# Patient Record
Sex: Male | Born: 1960 | Race: White | Hispanic: No | Marital: Married | Smoking: Former smoker
Health system: Southern US, Community
[De-identification: ages and names within clinical notes are randomized; demographics above are authoritative.]

## PROBLEM LIST (undated history)

## (undated) DIAGNOSIS — K5792 Diverticulitis of intestine, part unspecified, without perforation or abscess without bleeding: Secondary | ICD-10-CM

## (undated) DIAGNOSIS — M1990 Unspecified osteoarthritis, unspecified site: Secondary | ICD-10-CM

## (undated) DIAGNOSIS — C187 Malignant neoplasm of sigmoid colon: Secondary | ICD-10-CM

## (undated) DIAGNOSIS — I10 Essential (primary) hypertension: Secondary | ICD-10-CM

## (undated) DIAGNOSIS — K219 Gastro-esophageal reflux disease without esophagitis: Secondary | ICD-10-CM

## (undated) DIAGNOSIS — G629 Polyneuropathy, unspecified: Secondary | ICD-10-CM

## (undated) DIAGNOSIS — F419 Anxiety disorder, unspecified: Secondary | ICD-10-CM

## (undated) HISTORY — DX: Malignant neoplasm of sigmoid colon: C18.7

---

## 2015-08-22 ENCOUNTER — Emergency Department (HOSPITAL_COMMUNITY)
Admission: EM | Admit: 2015-08-22 | Discharge: 2015-08-22 | Disposition: A | Discharge status: Home / Self Care | Payer: BLUE CROSS/BLUE SHIELD | Attending: Emergency Medicine | Admitting: Emergency Medicine

## 2015-08-22 ENCOUNTER — Encounter (HOSPITAL_COMMUNITY): Payer: Self-pay | Admitting: Emergency Medicine

## 2015-08-22 ENCOUNTER — Emergency Department (HOSPITAL_COMMUNITY): Payer: BLUE CROSS/BLUE SHIELD

## 2015-08-22 DIAGNOSIS — I10 Essential (primary) hypertension: Secondary | ICD-10-CM | POA: Insufficient documentation

## 2015-08-22 DIAGNOSIS — R1032 Left lower quadrant pain: Secondary | ICD-10-CM | POA: Diagnosis present

## 2015-08-22 DIAGNOSIS — K5732 Diverticulitis of large intestine without perforation or abscess without bleeding: Secondary | ICD-10-CM | POA: Diagnosis not present

## 2015-08-22 HISTORY — DX: Essential (primary) hypertension: I10

## 2015-08-22 HISTORY — DX: Diverticulitis of intestine, part unspecified, without perforation or abscess without bleeding: K57.92

## 2015-08-22 LAB — URINALYSIS, ROUTINE W REFLEX MICROSCOPIC
GLUCOSE, UA: NEGATIVE mg/dL
HGB URINE DIPSTICK: NEGATIVE
KETONES UR: 15 mg/dL — AB
Leukocytes, UA: NEGATIVE
Nitrite: NEGATIVE
PROTEIN: NEGATIVE mg/dL
Specific Gravity, Urine: >1.03 — ABNORMAL HIGH (ref 1.005–1.030)
pH: 5.5 (ref 5.0–8.0)

## 2015-08-22 LAB — CBC
HCT: 46.7 % (ref 39.0–52.0)
Hemoglobin: 15.4 g/dL (ref 13.0–17.0)
MCH: 26.5 pg (ref 26.0–34.0)
MCHC: 33 g/dL (ref 30.0–36.0)
MCV: 80.4 fL (ref 78.0–100.0)
PLATELETS: 359 10*3/uL (ref 150–400)
RBC: 5.81 MIL/uL (ref 4.22–5.81)
RDW: 14 % (ref 11.5–15.5)
WBC: 10.6 10*3/uL — ABNORMAL HIGH (ref 4.0–10.5)

## 2015-08-22 LAB — DIFFERENTIAL
BASOS PCT: 0 %
Basophils Absolute: 0 10*3/uL (ref 0.0–0.1)
EOS ABS: 0.2 10*3/uL (ref 0.0–0.7)
EOS PCT: 1 %
Lymphocytes Relative: 18 %
Lymphs Abs: 1.9 10*3/uL (ref 0.7–4.0)
MONO ABS: 1 10*3/uL (ref 0.1–1.0)
Monocytes Relative: 10 %
Neutro Abs: 7.6 10*3/uL (ref 1.7–7.7)
Neutrophils Relative %: 71 %

## 2015-08-22 LAB — COMPREHENSIVE METABOLIC PANEL
ALT: 15 U/L — ABNORMAL LOW (ref 17–63)
ANION GAP: 8 (ref 5–15)
AST: 17 U/L (ref 15–41)
Albumin: 4.1 g/dL (ref 3.5–5.0)
Alkaline Phosphatase: 74 U/L (ref 38–126)
BUN: 18 mg/dL (ref 6–20)
CALCIUM: 9.3 mg/dL (ref 8.9–10.3)
CHLORIDE: 102 mmol/L (ref 101–111)
CO2: 26 mmol/L (ref 22–32)
CREATININE: 1.37 mg/dL — AB (ref 0.61–1.24)
GFR calc non Af Amer: 57 mL/min — ABNORMAL LOW (ref >60)
Glucose, Bld: 97 mg/dL (ref 65–99)
Potassium: 3.9 mmol/L (ref 3.5–5.1)
SODIUM: 136 mmol/L (ref 135–145)
Total Bilirubin: 0.7 mg/dL (ref 0.3–1.2)
Total Protein: 7.7 g/dL (ref 6.5–8.1)

## 2015-08-22 LAB — LIPASE, BLOOD: LIPASE: 24 U/L (ref 11–51)

## 2015-08-22 MED ORDER — SODIUM CHLORIDE 0.9 % IV SOLN
1000.0000 mL | Freq: Once | INTRAVENOUS | Status: AC
  Administered 2015-08-22: 1000 mL via INTRAVENOUS

## 2015-08-22 MED ORDER — METRONIDAZOLE 500 MG PO TABS
500.0000 mg | ORAL_TABLET | Freq: Once | ORAL | Status: AC
  Administered 2015-08-22: 500 mg via ORAL
  Filled 2015-08-22: qty 1

## 2015-08-22 MED ORDER — METRONIDAZOLE 500 MG PO TABS: 500.0000 mg | ORAL_TABLET | Freq: Three times a day (TID) | ORAL | Status: DC

## 2015-08-22 MED ORDER — SULFAMETHOXAZOLE-TRIMETHOPRIM 800-160 MG PO TABS
1.0000 | ORAL_TABLET | Freq: Two times a day (BID) | ORAL | Status: DC
  Administered 2015-08-22: 1 via ORAL
  Filled 2015-08-22: qty 1

## 2015-08-22 MED ORDER — SODIUM CHLORIDE 0.9 % IV SOLN
1000.0000 mL | INTRAVENOUS | Status: DC
  Administered 2015-08-22: 1000 mL via INTRAVENOUS

## 2015-08-22 MED ORDER — ONDANSETRON HCL 4 MG/2ML IJ SOLN
4.0000 mg | Freq: Once | INTRAMUSCULAR | Status: AC
  Administered 2015-08-22: 4 mg via INTRAVENOUS
  Filled 2015-08-22: qty 2

## 2015-08-22 MED ORDER — SULFAMETHOXAZOLE-TRIMETHOPRIM 800-160 MG PO TABS: 1.0000 | ORAL_TABLET | Freq: Two times a day (BID) | ORAL | Status: AC

## 2015-08-22 MED ORDER — IOHEXOL 300 MG/ML  SOLN
50.0000 mL | Freq: Once | INTRAMUSCULAR | Status: AC | PRN
  Administered 2015-08-22: 50 mL via ORAL

## 2015-08-22 MED ORDER — OXYCODONE-ACETAMINOPHEN 5-325 MG PO TABS: 1.0000 | ORAL_TABLET | ORAL | Status: DC | PRN

## 2015-08-22 MED ORDER — MORPHINE SULFATE (PF) 4 MG/ML IV SOLN
4.0000 mg | Freq: Once | INTRAVENOUS | Status: AC
  Administered 2015-08-22: 4 mg via INTRAVENOUS
  Filled 2015-08-22: qty 1

## 2015-08-22 MED ORDER — IOHEXOL 300 MG/ML  SOLN
100.0000 mL | Freq: Once | INTRAMUSCULAR | Status: AC | PRN
  Administered 2015-08-22: 100 mL via INTRAVENOUS

## 2015-08-22 NOTE — ED Provider Notes (Signed)
CSN: [id number]     Arrival date & time 08/22/15  1339 History   First MD Initiated Contact with Patient 08/22/15 1539     Chief Complaint  Patient presents with  . Abdominal Pain     (Consider location/radiation/quality/duration/timing/severity/associated sxs/prior Treatment) Patient is a 55 y.o. male presenting with abdominal pain. The history is provided by the patient.  Abdominal Pain He comes in with persistent left lower quadrant pain. He started having left lower quadrant pain in August, and he got worse about 2 months ago. He saw his PCP who ordered a CT scan and found he had diverticulitis. He was given a one-week course of ciprofloxacin and metronidazole which did give some temporary relief of pain, but pain recurred within a few days. He was given a second course of ciprofloxacin and metronidazole. This also gave temporary relief of pain, but symptoms recurred about 3-4 days after completion of the course of antibiotics. Pain does wax and wane and is rated at 8/10 at its worst. It is worse after eating and he does get temporary relief by moving his bowels. He initially had about a 15 pound weight loss but has not had any additional weight loss over the last month. Appetite is decreased. He has had subjective fever but has not actually had a fever when his temperature was checked. He has had chills and night sweats. He denies nausea or vomiting he has had frequent bowel movements and has had some blood in his stool. He is scheduled for colonoscopy in 2 weeks.  Past Medical History  Diagnosis Date  . Diverticulitis   . Hypertension    History reviewed. No pertinent past surgical history. No family history on file. Social History  Substance Use Topics  . Smoking status: Never Smoker   . Smokeless tobacco: None  . Alcohol Use: Yes     Comment: occ    Review of Systems  Gastrointestinal: Positive for abdominal pain.  All other systems reviewed and are negative.     Allergies   Review of patient's allergies indicates no known allergies.  Home Medications   Prior to Admission medications   Not on File   BP 110/77 mmHg  Pulse 103  Temp(Src) 98.5 F (36.9 C) (Oral)  Resp 16  Ht 6' (1.829 m)  Wt 250 lb (113.399 kg)  BMI 33.90 kg/m2  SpO2 99% Physical Exam  Nursing note and vitals reviewed.  55 year old male, resting comfortably and in no acute distress. Vital signs are significant for borderline tachycardia. Oxygen saturation is 99%, which is normal. Head is normocephalic and atraumatic. PERRLA, EOMI. Oropharynx is clear. Neck is nontender and supple without adenopathy or JVD. Back is nontender and there is no CVA tenderness. Lungs are clear without rales, wheezes, or rhonchi. Chest is nontender. Heart has regular rate and rhythm without murmur. Abdomen is soft, flat, with some moderate found left lower quadrant tenderness. There is no rebound or guarding. There are no masses or hepatosplenomegaly and peristalsis is normoactive. Extremities have no cyanosis or edema, full range of motion is present. Skin is warm and dry without rash. Neurologic: Mental status is normal, cranial nerves are intact, there are no motor or sensory deficits.  ED Course  Procedures (including critical care time) Labs Review Results for orders placed or performed during the hospital encounter of 08/22/15  Lipase, blood  Result Value Ref Range   Lipase 24 11 - 51 U/L  Comprehensive metabolic panel  Result Value Ref Range  Sodium 136 135 - 145 mmol/L   Potassium 3.9 3.5 - 5.1 mmol/L   Chloride 102 101 - 111 mmol/L   CO2 26 22 - 32 mmol/L   Glucose, Bld 97 65 - 99 mg/dL   BUN 18 6 - 20 mg/dL   Creatinine, Ser 1.37 (H) 0.61 - 1.24 mg/dL   Calcium 9.3 8.9 - 10.3 mg/dL   Total Protein 7.7 6.5 - 8.1 g/dL   Albumin 4.1 3.5 - 5.0 g/dL   AST 17 15 - 41 U/L   ALT 15 (L) 17 - 63 U/L   Alkaline Phosphatase 74 38 - 126 U/L   Total Bilirubin 0.7 0.3 - 1.2 mg/dL   GFR calc non  Af Amer 57 (L) >60 mL/min   GFR calc Af Amer >60 >60 mL/min   Anion gap 8 5 - 15  CBC  Result Value Ref Range   WBC 10.6 (H) 4.0 - 10.5 K/uL   RBC 5.81 4.22 - 5.81 MIL/uL   Hemoglobin 15.4 13.0 - 17.0 g/dL   HCT 46.7 39.0 - 52.0 %   MCV 80.4 78.0 - 100.0 fL   MCH 26.5 26.0 - 34.0 pg   MCHC 33.0 30.0 - 36.0 g/dL   RDW 14.0 11.5 - 15.5 %   Platelets 359 150 - 400 K/uL  Urinalysis, Routine w reflex microscopic (not at [facility])  Result Value Ref Range   Color, Urine YELLOW YELLOW   APPearance CLEAR CLEAR   Specific Gravity, Urine >1.030 (H) 1.005 - 1.030   pH 5.5 5.0 - 8.0   Glucose, UA NEGATIVE NEGATIVE mg/dL   Hgb urine dipstick NEGATIVE NEGATIVE   Bilirubin Urine SMALL (A) NEGATIVE   Ketones, ur 15 (A) NEGATIVE mg/dL   Protein, ur NEGATIVE NEGATIVE mg/dL   Nitrite NEGATIVE NEGATIVE   Leukocytes, UA NEGATIVE NEGATIVE  Differential  Result Value Ref Range   Neutrophils Relative % 71 %   Neutro Abs 7.6 1.7 - 7.7 K/uL   Lymphocytes Relative 18 %   Lymphs Abs 1.9 0.7 - 4.0 K/uL   Monocytes Relative 10 %   Monocytes Absolute 1.0 0.1 - 1.0 K/uL   Eosinophils Relative 1 %   Eosinophils Absolute 0.2 0.0 - 0.7 K/uL   Basophils Relative 0 %   Basophils Absolute 0.0 0.0 - 0.1 K/uL   Imaging Review Ct Abdomen Pelvis W Contrast  08/22/2015  CLINICAL DATA:  Left lower quadrant pain.  History diverticulitis EXAM: CT ABDOMEN AND PELVIS WITH CONTRAST TECHNIQUE: Multidetector CT imaging of the abdomen and pelvis was performed using the standard protocol following bolus administration of intravenous contrast. CONTRAST:  59mL OMNIPAQUE IOHEXOL 300 MG/ML SOLN, 121mL OMNIPAQUE IOHEXOL 300 MG/ML SOLN COMPARISON:  None. FINDINGS: Lower chest:  Negative Hepatobiliary: Fatty infiltration of the liver. No focal liver lesion. Liver size and contour normal. Gallbladder and bile ducts normal. Pancreas: Atrophic pancreatic changes without edema or mass. No pancreatic calcifications. Spleen: Negative  Adrenals/Urinary Tract: Symmetric renal contrast excretion bilaterally. No mass or obstruction. No renal stone. New thickening of the dome of bladder likely due to adjacent sigmoid colon diverticulitis. Stomach/Bowel: Segmental thickening of the sigmoid colon with associated pericolonic edema. Scattered diverticula are present in the descending colon and sigmoid colon. Findings consistent with diverticulitis. No abscess. No free air. Vascular/Lymphatic: Normal aorta and iliac arteries. No aneurysm. No lymphadenopathy Reproductive: Mild prostate enlargement. Other: No free fluid. 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 No  fracture. No acute bony process. IMPRESSION: Sigmoid diverticulitis. No abscess or free air. There is thickening of the dome of the bladder consistent with adjacent inflammation from diverticulitis. Mild fatty infiltration liver.  Atrophic pancreatic changes. Electronically Signed   By: Franchot Gallo M.D.   On: 08/22/2015 17:44   I have personally reviewed and evaluated these images and lab results as part of my medical decision-making.  MDM   Final diagnoses:  Sigmoid diverticulitis    Left lower quadrant pain with known history of diverticulitis. He did bring in a CT report from [place], [place] which showed uncomplicated diverticulitis. Unfortunately, I cannot view those images. With incomplete response to 2 courses of antibiotics, he may need a prolonged course of antibiotics. He needs to be screened for possible abscess which might account for failure to respond. He will be sent for CT scan of abdomen and pelvis. He does show subtle signs of dehydration with underlying tachycardia and elevated urine specific gravity, so he will be given IV hydration. He has no old records and the [facility] system.  CT shows evidence of diverticulitis without evidence of perforation or abscess. I am  concerned that the failure to respond to antibiotics may indicate another pathology mimicking diverticulitis. However, he is already scheduled for colonoscopy in 10 days. He is nontoxic in appearance and has normal WBC. I have offered him the option of inpatient care but he states he would prefer to go home. I will try him with a different antibiotic and slightly longer course. He is given a prescription for trimethoprim-sulfamethoxazole plus metronidazole for 10 days. This will take him up to the point of his colonoscopy at which point his gastro-neurologist can make recommendations for further treatment. He is also given a prescription for oxycodone-acetaminophen for pain. Return precautions given.  Delora Fuel, MD [id number]

## 2015-08-22 NOTE — Discharge Instructions (Signed)
Return if pain is getting worse or if you start running a high fever.`  Diverticulitis Diverticulitis is inflammation or infection of small pouches in your colon that form when you have a condition called diverticulosis. The pouches in your colon are called diverticula. Your colon, or large intestine, is where water is absorbed and stool is formed. Complications of diverticulitis can include:  Bleeding.  Severe infection.  Severe pain.  Perforation of your colon.  Obstruction of your colon. CAUSES  Diverticulitis is caused by bacteria. Diverticulitis happens when stool becomes trapped in diverticula. This allows bacteria to grow in the diverticula, which can lead to inflammation and infection. RISK FACTORS People with diverticulosis are at risk for diverticulitis. Eating a diet that does not include enough fiber from fruits and vegetables may make diverticulitis more likely to develop. SYMPTOMS  Symptoms of diverticulitis may include:  Abdominal pain and tenderness. The pain is normally located on the left side of the abdomen, but may occur in other areas.  Fever and chills.  Bloating.  Cramping.  Nausea.  Vomiting.  Constipation.  Diarrhea.  Blood in your stool. DIAGNOSIS  Your health care provider will ask you about your medical history and do a physical exam. You may need to have tests done because many medical conditions can cause the same symptoms as diverticulitis. Tests may include:  Blood tests.  Urine tests.  Imaging tests of the abdomen, including X-rays and CT scans. When your condition is under control, your health care provider may recommend that you have a colonoscopy. A colonoscopy can show how severe your diverticula are and whether something else is causing your symptoms. TREATMENT  Most cases of diverticulitis are mild and can be treated at home. Treatment may include:  Taking over-the-counter pain medicines.  Following a clear liquid  diet.  Taking antibiotic medicines by mouth for 7-10 days. More severe cases may be treated at a hospital. Treatment may include:  Not eating or drinking.  Taking prescription pain medicine.  Receiving antibiotic medicines through an IV tube.  Receiving fluids and nutrition through an IV tube.  Surgery. HOME CARE INSTRUCTIONS   Follow your health care provider's instructions carefully.  Follow a full liquid diet or other diet as directed by your health care provider. After your symptoms improve, your health care provider may tell you to change your diet. He or she may recommend you eat a high-fiber diet. Fruits and vegetables are good sources of fiber. Fiber makes it easier to pass stool.  Take fiber supplements or probiotics as directed by your health care provider.  Only take medicines as directed by your health care provider.  Keep all your follow-up appointments. SEEK MEDICAL CARE IF:   Your pain does not improve.  You have a hard time eating food.  Your bowel movements do not return to normal. SEEK IMMEDIATE MEDICAL CARE IF:   Your pain becomes worse.  Your symptoms do not get better.  Your symptoms suddenly get worse.  You have a fever.  You have repeated vomiting.  You have bloody or black, tarry stools. MAKE SURE YOU:   Understand these instructions.  Will watch your condition.  Will get help right away if you are not doing well or get worse.   This information is not intended to replace advice given to you by your health care provider. Make sure you discuss any questions you have with your health care provider.   Document Released: 03/14/2005 Document Revised: 06/09/2013 Document Reviewed: 04/29/2013 Elsevier  Interactive Patient Education 2016 [employer].  Sulfamethoxazole; Trimethoprim, SMX-TMP tablets What is this medicine? SULFAMETHOXAZOLE; TRIMETHOPRIM or SMX-TMP (suhl fuh meth OK suh zohl; trye METH oh prim) is a combination of a  sulfonamide antibiotic and a second antibiotic, trimethoprim. It is used to treat or prevent certain kinds of bacterial infections. It will not work for colds, flu, or other viral infections. This medicine may be used for other purposes; ask your health care provider or pharmacist if you have questions. What should I tell my health care provider before I take this medicine? They need to know if you have any of these conditions: -anemia -asthma -being treated with anticonvulsants -if you frequently drink alcohol containing drinks -kidney disease -liver disease -low level of folic acid or [id number] dehydrogenase -poor nutrition or malabsorption -porphyria -severe allergies -thyroid disorder -an unusual or allergic reaction to sulfamethoxazole, trimethoprim, sulfa drugs, other medicines, foods, dyes, or preservatives -pregnant or trying to get pregnant -breast-feeding How should I use this medicine? Take this medicine by mouth with a full glass of water. Follow the directions on the prescription label. Take your medicine at regular intervals. Do not take it more often than directed. Do not skip doses or stop your medicine early. Talk to your pediatrician regarding the use of this medicine in children. Special care may be needed. This medicine has been used in children as young as 72 months of age. Overdosage: If you think you have taken too much of this medicine contact a poison control center or emergency room at once. NOTE: This medicine is only for you. Do not share this medicine with others. What if I miss a dose? If you miss a dose, take it as soon as you can. If it is almost time for your next dose, take only that dose. Do not take double or extra doses. What may interact with this medicine? Do not take this medicine with any of the following medications: -aminobenzoate potassium -dofetilide -metronidazole This medicine may also interact with the following medications: -ACE  inhibitors like benazepril, enalapril, lisinopril, and ramipril -birth control pills -cyclosporine -digoxin -diuretics -indomethacin -medicines for diabetes -methenamine -methotrexate -phenytoin -potassium supplements -pyrimethamine -sulfinpyrazone -tricyclic antidepressants -warfarin This list may not describe all possible interactions. Give your health care provider a list of all the medicines, herbs, non-prescription drugs, or dietary supplements you use. Also tell them if you smoke, drink alcohol, or use illegal drugs. Some items may interact with your medicine. What should I watch for while using this medicine? Tell your doctor or health care professional if your symptoms do not improve. Drink several glasses of water a day to reduce the risk of kidney problems. Do not treat diarrhea with over the counter products. Contact your doctor if you have diarrhea that lasts more than 2 days or if it is severe and watery. This medicine can make you more sensitive to the sun. Keep out of the sun. If you cannot avoid being in the sun, wear protective clothing and use a sunscreen. Do not use sun lamps or tanning beds/booths. What side effects may I notice from receiving this medicine? Side effects that you should report to your doctor or health care professional as soon as possible: -allergic reactions like skin rash or hives, swelling of the face, lips, or tongue -breathing problems -fever or chills, sore throat -irregular heartbeat, chest pain -joint or muscle pain -pain or difficulty passing urine -red pinpoint spots on skin -redness, blistering, peeling or loosening of the skin, including  inside the mouth -unusual bleeding or bruising -unusually weak or tired -yellowing of the eyes or skin Side effects that usually do not require medical attention (report to your doctor or health care professional if they continue or are bothersome): -diarrhea -dizziness -headache -loss of  appetite -nausea, vomiting -nervousness This list may not describe all possible side effects. Call your doctor for medical advice about side effects. You may report side effects to FDA at 1-800-FDA-1088. Where should I keep my medicine? Keep out of the reach of children. Store at room temperature between 20 to 25 degrees C (68 to 77 degrees F). Protect from light. Throw away any unused medicine after the expiration date. NOTE: This sheet is a summary. It may not cover all possible information. If you have questions about this medicine, talk to your doctor, pharmacist, or health care provider.    2016, Elsevier/[employer] Standard. (2013-01-09 14:38:26)  Metronidazole tablets or capsules What is this medicine? METRONIDAZOLE (me troe NI da zole) is an antiinfective. It is used to treat certain kinds of bacterial and protozoal infections. It will not work for colds, flu, or other viral infections. This medicine may be used for other purposes; ask your health care provider or pharmacist if you have questions. What should I tell my health care provider before I take this medicine? They need to know if you have any of these conditions: -anemia or other blood disorders -disease of the nervous system -fungal or yeast infection -if you drink alcohol containing drinks -liver disease -seizures -an unusual or allergic reaction to metronidazole, or other medicines, foods, dyes, or preservatives -pregnant or trying to get pregnant -breast-feeding How should I use this medicine? Take this medicine by mouth with a full glass of water. Follow the directions on the prescription label. Take your medicine at regular intervals. Do not take your medicine more often than directed. Take all of your medicine as directed even if you think you are better. Do not skip doses or stop your medicine early. Talk to your pediatrician regarding the use of this medicine in children. Special care may be needed. Overdosage: If  you think you have taken too much of this medicine contact a poison control center or emergency room at once. NOTE: This medicine is only for you. Do not share this medicine with others. What if I miss a dose? If you miss a dose, take it as soon as you can. If it is almost time for your next dose, take only that dose. Do not take double or extra doses. What may interact with this medicine? Do not take this medicine with any of the following medications: -alcohol or any product that contains alcohol -amprenavir oral solution -cisapride -disulfiram -dofetilide -dronedarone -paclitaxel injection -pimozide -ritonavir oral solution -sertraline oral solution -sulfamethoxazole-trimethoprim injection -thioridazine -ziprasidone This medicine may also interact with the following medications: -birth control pills -cimetidine -lithium -other medicines that prolong the QT interval (cause an abnormal heart rhythm) -phenobarbital -phenytoin -warfarin This list may not describe all possible interactions. Give your health care provider a list of all the medicines, herbs, non-prescription drugs, or dietary supplements you use. Also tell them if you smoke, drink alcohol, or use illegal drugs. Some items may interact with your medicine. What should I watch for while using this medicine? Tell your doctor or health care professional if your symptoms do not improve or if they get worse. You may get drowsy or dizzy. Do not drive, use machinery, or do anything that needs mental alertness  until you know how this medicine affects you. Do not stand or sit up quickly, especially if you are an older patient. This reduces the risk of dizzy or fainting spells. Avoid alcoholic drinks while you are taking this medicine and for three days afterward. Alcohol may make you feel dizzy, sick, or flushed. If you are being treated for a sexually transmitted disease, avoid sexual contact until you have finished your treatment.  Your sexual partner may also need treatment. What side effects may I notice from receiving this medicine? Side effects that you should report to your doctor or health care professional as soon as possible: -allergic reactions like skin rash or hives, swelling of the face, lips, or tongue -confusion, clumsiness -difficulty speaking -discolored or sore mouth -dizziness -fever, infection -numbness, tingling, pain or weakness in the hands or feet -trouble passing urine or change in the amount of urine -redness, blistering, peeling or loosening of the skin, including inside the mouth -seizures -unusually weak or tired -vaginal irritation, dryness, or discharge Side effects that usually do not require medical attention (report to your doctor or health care professional if they continue or are bothersome): -diarrhea -headache -irritability -metallic taste -nausea -stomach pain or cramps -trouble sleeping This list may not describe all possible side effects. Call your doctor for medical advice about side effects. You may report side effects to FDA at 1-800-FDA-1088. Where should I keep my medicine? Keep out of the reach of children. Store at room temperature below 25 degrees C (77 degrees F). Protect from light. Keep container tightly closed. Throw away any unused medicine after the expiration date. NOTE: This sheet is a summary. It may not cover all possible information. If you have questions about this medicine, talk to your doctor, pharmacist, or health care provider.    2016, Elsevier/Gold Standard. (2013-01-09 14:08:39)  Acetaminophen; Oxycodone tablets What is this medicine? ACETAMINOPHEN; OXYCODONE (a set a MEE noe fen; ox i KOE done) is a pain reliever. It is used to treat moderate to severe pain. This medicine may be used for other purposes; ask your health care provider or pharmacist if you have questions. What should I tell my health care provider before I take this  medicine? They need to know if you have any of these conditions: -brain tumor -Crohn's disease, inflammatory bowel disease, or ulcerative colitis -drug abuse or addiction -head injury -heart or circulation problems -if you often drink alcohol -kidney disease or problems going to the bathroom -liver disease -lung disease, asthma, or breathing problems -an unusual or allergic reaction to acetaminophen, oxycodone, other opioid analgesics, other medicines, foods, dyes, or preservatives -pregnant or trying to get pregnant -breast-feeding How should I use this medicine? Take this medicine by mouth with a full glass of water. Follow the directions on the prescription label. You can take it with or without food. If it upsets your stomach, take it with food. Take your medicine at regular intervals. Do not take it more often than directed. Talk to your pediatrician regarding the use of this medicine in children. Special care may be needed. Patients over 41 years old may have a stronger reaction and need a smaller dose. Overdosage: If you think you have taken too much of this medicine contact a poison control center or emergency room at once. NOTE: This medicine is only for you. Do not share this medicine with others. What if I miss a dose? If you miss a dose, take it as soon as you can. If it is  almost time for your next dose, take only that dose. Do not take double or extra doses. What may interact with this medicine? -alcohol -antihistamines -barbiturates like amobarbital, butalbital, butabarbital, methohexital, pentobarbital, phenobarbital, thiopental, and secobarbital -benztropine -drugs for bladder problems like solifenacin, trospium, oxybutynin, tolterodine, hyoscyamine, and methscopolamine -drugs for breathing problems like ipratropium and tiotropium -drugs for certain stomach or intestine problems like propantheline, homatropine methylbromide, glycopyrrolate, atropine, belladonna, and  dicyclomine -general anesthetics like etomidate, ketamine, nitrous oxide, propofol, desflurane, enflurane, halothane, isoflurane, and sevoflurane -medicines for depression, anxiety, or psychotic disturbances -medicines for sleep -muscle relaxants -naltrexone -narcotic medicines (opiates) for pain -phenothiazines like perphenazine, thioridazine, chlorpromazine, mesoridazine, fluphenazine, prochlorperazine, promazine, and trifluoperazine -scopolamine -tramadol -trihexyphenidyl This list may not describe all possible interactions. Give your health care provider a list of all the medicines, herbs, non-prescription drugs, or dietary supplements you use. Also tell them if you smoke, drink alcohol, or use illegal drugs. Some items may interact with your medicine. What should I watch for while using this medicine? Tell your doctor or health care professional if your pain does not go away, if it gets worse, or if you have new or a different type of pain. You may develop tolerance to the medicine. Tolerance means that you will need a higher dose of the medication for pain relief. Tolerance is normal and is expected if you take this medicine for a long time. Do not suddenly stop taking your medicine because you may develop a severe reaction. Your body becomes used to the medicine. This does NOT mean you are addicted. Addiction is a behavior related to getting and using a drug for a non-medical reason. If you have pain, you have a medical reason to take pain medicine. Your doctor will tell you how much medicine to take. If your doctor wants you to stop the medicine, the dose will be slowly lowered over time to avoid any side effects. You may get drowsy or dizzy. Do not drive, use machinery, or do anything that needs mental alertness until you know how this medicine affects you. Do not stand or sit up quickly, especially if you are an older patient. This reduces the risk of dizzy or fainting spells. Alcohol may  interfere with the effect of this medicine. Avoid alcoholic drinks. There are different types of narcotic medicines (opiates) for pain. If you take more than one type at the same time, you may have more side effects. Give your health care provider a list of all medicines you use. Your doctor will tell you how much medicine to take. Do not take more medicine than directed. Call emergency for help if you have problems breathing. The medicine will cause constipation. Try to have a bowel movement at least every 2 to 3 days. If you do not have a bowel movement for 3 days, call your doctor or health care professional. Do not take Tylenol (acetaminophen) or medicines that have acetaminophen with this medicine. Too much acetaminophen can be very dangerous. Many nonprescription medicines contain acetaminophen. Always read the labels carefully to avoid taking more acetaminophen. What side effects may I notice from receiving this medicine? Side effects that you should report to your doctor or health care professional as soon as possible: -allergic reactions like skin rash, itching or hives, swelling of the face, lips, or tongue -breathing difficulties, wheezing -confusion -light headedness or fainting spells -severe stomach pain -unusually weak or tired -yellowing of the skin or the whites of the eyes Side effects that usually do  not require medical attention (report to your doctor or health care professional if they continue or are bothersome): -dizziness -drowsiness -nausea -vomiting This list may not describe all possible side effects. Call your doctor for medical advice about side effects. You may report side effects to FDA at 1-800-FDA-1088. Where should I keep my medicine? Keep out of the reach of children. This medicine can be abused. Keep your medicine in a safe place to protect it from theft. Do not share this medicine with anyone. Selling or giving away this medicine is dangerous and against the  law. This medicine may cause accidental overdose and death if it taken by other adults, children, or pets. Mix any unused medicine with a substance like cat litter or coffee grounds. Then throw the medicine away in a sealed container like a sealed bag or a coffee can with a lid. Do not use the medicine after the expiration date. Store at room temperature between 20 and 25 degrees C (68 and 77 degrees F). NOTE: This sheet is a summary. It may not cover all possible information. If you have questions about this medicine, talk to your doctor, pharmacist, or health care provider.    2016, [employer]. (2014-05-05 15:18:46)

## 2015-08-22 NOTE — ED Notes (Signed)
Pt reports on-going LLQ abdominal pain. Pt had a CT scan in January and dx with diverticulitis. Pt reports finishing medications, but reports no relief. Pt also c/o loss of appetite, weight loss, and diarrhea.

## 2015-08-22 NOTE — ED Notes (Signed)
MD at bedside. 

## 2015-09-09 ENCOUNTER — Emergency Department (HOSPITAL_COMMUNITY): Payer: BLUE CROSS/BLUE SHIELD

## 2015-09-09 ENCOUNTER — Encounter (HOSPITAL_COMMUNITY): Payer: Self-pay

## 2015-09-09 ENCOUNTER — Inpatient Hospital Stay (HOSPITAL_COMMUNITY)
Admission: EM | Admit: 2015-09-09 | Discharge: 2015-09-12 | DRG: 391 | Disposition: A | Discharge status: Home / Self Care | Payer: BLUE CROSS/BLUE SHIELD | Attending: Internal Medicine | Admitting: Internal Medicine

## 2015-09-09 DIAGNOSIS — R1032 Left lower quadrant pain: Secondary | ICD-10-CM | POA: Diagnosis present

## 2015-09-09 DIAGNOSIS — K5792 Diverticulitis of intestine, part unspecified, without perforation or abscess without bleeding: Secondary | ICD-10-CM

## 2015-09-09 DIAGNOSIS — E43 Unspecified severe protein-calorie malnutrition: Secondary | ICD-10-CM | POA: Diagnosis present

## 2015-09-09 DIAGNOSIS — K5720 Diverticulitis of large intestine with perforation and abscess without bleeding: Secondary | ICD-10-CM | POA: Diagnosis present

## 2015-09-09 DIAGNOSIS — Z833 Family history of diabetes mellitus: Secondary | ICD-10-CM | POA: Diagnosis not present

## 2015-09-09 DIAGNOSIS — K625 Hemorrhage of anus and rectum: Secondary | ICD-10-CM | POA: Diagnosis present

## 2015-09-09 DIAGNOSIS — I10 Essential (primary) hypertension: Secondary | ICD-10-CM | POA: Diagnosis present

## 2015-09-09 DIAGNOSIS — Z6831 Body mass index (BMI) 31.0-31.9, adult: Secondary | ICD-10-CM

## 2015-09-09 DIAGNOSIS — K921 Melena: Secondary | ICD-10-CM | POA: Diagnosis not present

## 2015-09-09 DIAGNOSIS — K5732 Diverticulitis of large intestine without perforation or abscess without bleeding: Secondary | ICD-10-CM | POA: Diagnosis not present

## 2015-09-09 LAB — CBC
HCT: 43.4 % (ref 39.0–52.0)
Hemoglobin: 14.5 g/dL (ref 13.0–17.0)
MCH: 26.5 pg (ref 26.0–34.0)
MCHC: 33.4 g/dL (ref 30.0–36.0)
MCV: 79.3 fL (ref 78.0–100.0)
PLATELETS: 367 10*3/uL (ref 150–400)
RBC: 5.47 MIL/uL (ref 4.22–5.81)
RDW: 13.4 % (ref 11.5–15.5)
WBC: 6.2 10*3/uL (ref 4.0–10.5)

## 2015-09-09 LAB — COMPREHENSIVE METABOLIC PANEL
ALK PHOS: 67 U/L (ref 38–126)
ALT: 12 U/L — AB (ref 17–63)
AST: 15 U/L (ref 15–41)
Albumin: 3.5 g/dL (ref 3.5–5.0)
Anion gap: 11 (ref 5–15)
BUN: 11 mg/dL (ref 6–20)
CALCIUM: 9 mg/dL (ref 8.9–10.3)
CHLORIDE: 104 mmol/L (ref 101–111)
CO2: 25 mmol/L (ref 22–32)
CREATININE: 0.96 mg/dL (ref 0.61–1.24)
GFR calc Af Amer: >60 mL/min (ref >60)
GFR calc non Af Amer: >60 mL/min (ref >60)
GLUCOSE: 124 mg/dL — AB (ref 65–99)
Potassium: 3.8 mmol/L (ref 3.5–5.1)
SODIUM: 140 mmol/L (ref 135–145)
Total Bilirubin: 0.5 mg/dL (ref 0.3–1.2)
Total Protein: 7.2 g/dL (ref 6.5–8.1)

## 2015-09-09 LAB — URINALYSIS, ROUTINE W REFLEX MICROSCOPIC
GLUCOSE, UA: NEGATIVE mg/dL
HGB URINE DIPSTICK: NEGATIVE
KETONES UR: 15 mg/dL — AB
Leukocytes, UA: NEGATIVE
Nitrite: NEGATIVE
PROTEIN: NEGATIVE mg/dL
Specific Gravity, Urine: 1.02 (ref 1.005–1.030)
pH: 5 (ref 5.0–8.0)

## 2015-09-09 LAB — LIPASE, BLOOD: LIPASE: 23 U/L (ref 11–51)

## 2015-09-09 MED ORDER — ENSURE ENLIVE PO LIQD
237.0000 mL | Freq: Two times a day (BID) | ORAL | Status: DC
  Administered 2015-09-10 – 2015-09-11 (×2): 237 mL via ORAL

## 2015-09-09 MED ORDER — SODIUM CHLORIDE 0.9 % IV BOLUS (SEPSIS)
1000.0000 mL | Freq: Once | INTRAVENOUS | Status: AC
  Administered 2015-09-09: 1000 mL via INTRAVENOUS

## 2015-09-09 MED ORDER — PIPERACILLIN-TAZOBACTAM 3.375 G IVPB 30 MIN
3.3750 g | Freq: Once | INTRAVENOUS | Status: AC
  Administered 2015-09-09: 3.375 g via INTRAVENOUS
  Filled 2015-09-09: qty 50

## 2015-09-09 MED ORDER — SODIUM CHLORIDE 0.9 % IV SOLN: 250.0000 mL | INTRAVENOUS | Status: DC | PRN

## 2015-09-09 MED ORDER — HYDROMORPHONE HCL 1 MG/ML IJ SOLN
1.0000 mg | Freq: Once | INTRAMUSCULAR | Status: AC
  Administered 2015-09-09: 1 mg via INTRAVENOUS
  Filled 2015-09-09: qty 1

## 2015-09-09 MED ORDER — MORPHINE SULFATE (PF) 2 MG/ML IV SOLN
2.0000 mg | INTRAVENOUS | Status: DC | PRN
  Administered 2015-09-10: 2 mg via INTRAVENOUS
  Filled 2015-09-09: qty 1

## 2015-09-09 MED ORDER — ONDANSETRON HCL 4 MG/2ML IJ SOLN
4.0000 mg | Freq: Four times a day (QID) | INTRAMUSCULAR | Status: DC | PRN
  Administered 2015-09-09: 4 mg via INTRAVENOUS
  Filled 2015-09-09 (×2): qty 2

## 2015-09-09 MED ORDER — IOHEXOL 300 MG/ML  SOLN
100.0000 mL | Freq: Once | INTRAMUSCULAR | Status: AC | PRN
  Administered 2015-09-09: 100 mL via INTRAVENOUS

## 2015-09-09 MED ORDER — ACETAMINOPHEN 325 MG PO TABS: 650.0000 mg | ORAL_TABLET | Freq: Four times a day (QID) | ORAL | Status: DC | PRN

## 2015-09-09 MED ORDER — SODIUM CHLORIDE 0.9% FLUSH: 3.0000 mL | INTRAVENOUS | Status: DC | PRN

## 2015-09-09 MED ORDER — ONDANSETRON HCL 4 MG PO TABS
4.0000 mg | ORAL_TABLET | Freq: Four times a day (QID) | ORAL | Status: DC | PRN
  Administered 2015-09-11 – 2015-09-12 (×4): 4 mg via ORAL
  Filled 2015-09-09 (×4): qty 1

## 2015-09-09 MED ORDER — PIPERACILLIN-TAZOBACTAM 3.375 G IVPB
3.3750 g | Freq: Three times a day (TID) | INTRAVENOUS | Status: DC
  Administered 2015-09-09 – 2015-09-12 (×9): 3.375 g via INTRAVENOUS
  Filled 2015-09-09 (×6): qty 50

## 2015-09-09 MED ORDER — LISINOPRIL 10 MG PO TABS
10.0000 mg | ORAL_TABLET | Freq: Every day | ORAL | Status: DC
  Administered 2015-09-09 – 2015-09-12 (×4): 10 mg via ORAL
  Filled 2015-09-09 (×4): qty 1

## 2015-09-09 MED ORDER — ADULT MULTIVITAMIN W/MINERALS CH
1.0000 | ORAL_TABLET | Freq: Every day | ORAL | Status: DC
  Administered 2015-09-09 – 2015-09-12 (×4): 1 via ORAL
  Filled 2015-09-09 (×4): qty 1

## 2015-09-09 MED ORDER — IOHEXOL 300 MG/ML  SOLN
50.0000 mL | Freq: Once | INTRAMUSCULAR | Status: AC | PRN
  Administered 2015-09-09: 50 mL via ORAL

## 2015-09-09 MED ORDER — SODIUM CHLORIDE 0.9% FLUSH
3.0000 mL | Freq: Two times a day (BID) | INTRAVENOUS | Status: DC
  Administered 2015-09-09 – 2015-09-11 (×3): 3 mL via INTRAVENOUS

## 2015-09-09 MED ORDER — ACETAMINOPHEN 650 MG RE SUPP: 650.0000 mg | Freq: Four times a day (QID) | RECTAL | Status: DC | PRN

## 2015-09-09 MED ORDER — HYDROCODONE-ACETAMINOPHEN 5-325 MG PO TABS
1.0000 | ORAL_TABLET | ORAL | Status: DC | PRN
  Administered 2015-09-09 – 2015-09-12 (×5): 2 via ORAL
  Filled 2015-09-09 (×5): qty 2

## 2015-09-09 NOTE — ED Notes (Signed)
Report given to floor. Pt ready for transfer. 

## 2015-09-09 NOTE — ED Notes (Signed)
Pt complain of abdominal pain. States he had a colonoscopy in [place] and went back for his result but they were not ready. Pt states he still has blood in his stool

## 2015-09-09 NOTE — H&P (Signed)
History and Physical  Derek Rodriguez [id number] DOB: 12-20-1960 DOA: 09/09/2015  Referring physician: Dr. Jola Schmidt in [place] PCP: [name], MD   Chief Complaint: abdominal pain  HPI:  71yom PMH HTN presented to ED with ongoing left lower quadrant abdominal pain. CT scan demonstrated diverticulitis with contained perforation. Admitted for IV antibiotics and general surgical consultation.  Patient became sick at the end of the year around Christmas time 2016. January 2017 he had a CAT scan done which showed diverticulitis and he was treated with ciprofloxacin and Flagyl for 2 weeks. He had brief improvement but again developed left lower quadrant pain and had several more rounds of antibiotics including the above as well as Bactrim. He was seen in the emergency department early March at [facility] and discharged on antibiotics. He saw gastroenterologist in [place] and had a colonoscopy performed March 16 which was limited secondary to inflammation. He has not heard the results of this yet.  For the last month he's had blood with bowel movements, frequent bowel movements up to 12 times during a 12 hour work shift. Bowel movements are liquid. Last normal bowel movement more than one month ago. No history of inflammatory bowel disease or other bowel problems. No family history of bowel problems. No specific alleviating factors. Symptoms seem to worsen with eating although now he has pain constantly.  In the emergency department afebrile, VSS Pertinent labs: CMP and CBC unremarkable, lipase normal. Imaging: per radiolology: Continued severe inflammation of the proximal half of the sigmoid colon. There is a 2.6 cm oval low-attenuation structure extending between the posterior wall of the inflamed sigmoid colon and the dome of the bladder. This could represent a contained perforation or small abscess.  Review of Systems:  Negative for fever, visual changes, sore throat, rash, new muscle  aches, chest pain, SOB, dysuria, n/v.  Positive for chills.   Past Medical History  Diagnosis Date  . Diverticulitis   . Hypertension     History reviewed. No pertinent past surgical history.  None  Social History:  reports that he has never smoked. He does not have any smokeless tobacco history on file. He reports that he drinks alcohol. He reports that he does not use illicit drugs.   No Known Allergies  Family History  Problem Relation Age of Onset  . Diabetes Father      Prior to Admission medications   Medication Sig Start Date End Date Taking? Authorizing Provider  amoxicillin-clavulanate (AUGMENTIN) 500-125 MG tablet Take 1 tablet by mouth 3 (three) times daily.   Yes Historical Provider, MD  ibuprofen (ADVIL,MOTRIN) 200 MG tablet Take 400 mg by mouth every 6 (six) hours as needed.   Yes Historical Provider, MD  lisinopril (PRINIVIL,ZESTRIL) 10 MG tablet Take 10 mg by mouth daily.   Yes Historical Provider, MD  meloxicam (MOBIC) 15 MG tablet Take 15 mg by mouth every evening.    Yes Historical Provider, MD  ondansetron (ZOFRAN) 4 MG tablet Take 4 mg by mouth every 6 (six) hours as needed. 09/01/15  Yes Historical Provider, MD  Probiotic Product ([place]) CAPS Take 1 capsule by mouth daily.   Yes Historical Provider, MD  metroNIDAZOLE (FLAGYL) 500 MG tablet Take 1 tablet (500 mg total) by mouth 3 (three) times daily. Patient not taking: Reported on 09/09/2015 [id number]   Delora Fuel, MD  oxyCODONE-acetaminophen (PERCOCET) 5-325 MG tablet Take 1 tablet by mouth every 4 (four) hours as needed for moderate pain. Patient not taking: Reported  on 09/09/2015 [id number]   Delora Fuel, MD   Physical Exam: Filed Vitals:   09/09/15 1200 09/09/15 1230 09/09/15 1310 09/09/15 1349  BP: 142/97 122/96 126/96 130/87  Pulse: 71 58 55 55  Temp:   97.6 F (36.4 C) 97.5 F (36.4 C)  TempSrc:   Oral Oral  Resp:   16 16  Height:    6' (1.829 m)  Weight:      SpO2: 95% 97% 98% 99%     Constitutional:  . Appears calm and comfortable Eyes:  . PERRL and irises appear normal . Normal conjunctivae and lids ENMT:  . external ears, nose appear normal . grossly normal hearing . Lips appear normal; Fair dentition . Oropharynx: mucosa, tongue,posterior pharynx appear normal Neck:  . neck appears normal, no masses, normal ROM . no thyromegaly Respiratory:  . CTA bilaterally, no w/r/r.  . Respiratory effort normal. No retractions or accessory muscle use Cardiovascular:  . RRR, no m/r/g . No LE extremity edema   Abdomen:  . Abdomen appears normal; no masses. No rebound or guarding except pain in the left lower quadrant with palpation. . No hernias Musculoskeletal:  . Digits/nails all extremities: no clubbing, cyanosis, petechiae, infection . RUE, LUE, RLE, LLE   o strength and tone normal, no atrophy, no abnormal movements o No tenderness, masses Skin:  . No rashes, lesions, ulcers . palpation of skin: no induration or nodules Neurologic:  . Appears grossly normal Psychiatric:  . judgement and insight appear normal . Mental status o Mood, affect appropriate  Wt Readings from Last 3 Encounters:  09/09/15 106.595 kg (235 lb)  08/22/15 113.399 kg (250 lb)    Labs on Admission:  Basic Metabolic Panel:  Recent Labs Lab 09/09/15 0928  NA 140  K 3.8  CL 104  CO2 25  GLUCOSE 124*  BUN 11  CREATININE 0.96  CALCIUM 9.0    Liver Function Tests:  Recent Labs Lab 09/09/15 0928  AST 15  ALT 12*  ALKPHOS 67  BILITOT 0.5  PROT 7.2  ALBUMIN 3.5    Recent Labs Lab 09/09/15 0928  LIPASE 23   CBC:  Recent Labs Lab 09/09/15 0928  WBC 6.2  HGB 14.5  HCT 43.4  MCV 79.3  PLT 367    Radiological Exams on Admission: Ct Abdomen Pelvis W Contrast  09/09/2015  CLINICAL DATA:  Abdominal pain, had colonoscopy at an outside facility recently, left lower quadrant pain EXAM: CT ABDOMEN AND PELVIS WITH CONTRAST TECHNIQUE: Multidetector CT imaging of  the abdomen and pelvis was performed using the standard protocol following bolus administration of intravenous contrast. CONTRAST:  137mL OMNIPAQUE IOHEXOL 300 MG/ML SOLN, 67mL OMNIPAQUE IOHEXOL 300 MG/ML SOLN COMPARISON:  08/22/2015 FINDINGS: Lower chest:  The visualized portions of the lung bases are clear. Hepatobiliary: Mild diffuse steatosis of the liver. Gallbladder is normal. Pancreas: Mild fatty atrophy of the pancreas. Spleen: Spleen is normal Adrenals/Urinary Tract: Adrenal glands are normal. Kidneys are normal. No hydronephrosis. Bladder shows wall thickening particularly at the dome similar to prior study. Stomach/Bowel: There is severe wall thickening involving the sigmoid colon proximal half, the same area that was previously involved. There is surrounding inflammatory change. There is a nonobstructive bowel gas pattern. There is stool throughout the colon. No other bowel abnormalities. Vascular/Lymphatic: No significant vascular abnormality. There are prominent lymph nodes in the peritoneal fat adjacent to the sigmoid colon measuring up to 9 mm with surrounding inflammatory change. At the root of the mesentery there are  also mildly prominent lymph nodes measuring up to 10 mm. Reproductive: No significant abnormalities Other: There is a 2.6 cm mildly low-attenuation structure extending between the inferior wall the inflamed sigmoid colon can the dome of the bladder. This was present previously. Musculoskeletal: No acute findings IMPRESSION: Continued severe inflammation of the proximal half of the sigmoid colon. There is a 2.6 cm oval low-attenuation structure extending between the posterior wall of the inflamed sigmoid colon and the dome of the bladder. This could represent a contained perforation or small abscess. Electronically Signed   By: Skipper Cliche M.D.   On: 09/09/2015 11:08      Principal Problem:   Diverticulitis of colon with perforation Active Problems:   Acute diverticulitis    Colonic diverticular abscess   Assessment/Plan 1. Refractory severe diverticulitis with contained perforation vs abscess. This failed several rounds of Cipro and Flagyl as an outpatient as well as Bactrim and 7 days of Augmentin. 2. Blood per rectum, with bowel movements, at least for 1 month. Hemoglobin normal. Low volume. 3. HTN   Appears stable. Admit to med-surg  IV abx  General surgery and GI consult for further recommendations.  Code Status: full DVT prophylaxis:SCDs Family Communication: wife at bedside Disposition Plan/Anticipated LOS: admit, 3 days  Time spent: 60 minutes  Murray Hodgkins, MD  [facility] Direct contact:  --[place]ia [place]  --[email or web address]; password [email or web address] and click  [email or web address] contact night coverage as above  09/09/2015, 4:59 PM

## 2015-09-09 NOTE — Progress Notes (Signed)
ANTIBIOTIC CONSULT NOTE - PRELIMINARY  Pharmacy Consult for  Zosyn  Indication: intra-abdominal infection  No Known Allergies  Patient Measurements: Height: 6' (182.9 cm) Weight: 235 lb (106.595 kg) IBW/kg (Calculated) : 77.6  Vital Signs: Temp: 97.5 F (36.4 C) (03/24 1349) Temp Source: Oral (03/24 1349) BP: 130/87 mmHg (03/24 1349) Pulse Rate: 55 (03/24 1349)  Labs:  Recent Labs  09/09/15 0928  WBC 6.2  HGB 14.5  PLT 367  CREATININE 0.96   Microbiology: No results found for this or any previous visit (from the past 720 hour(s)).  Medical History: Past Medical History  Diagnosis Date  . Diverticulitis   . Hypertension    Estimated Creatinine Clearance: 109.7 mL/min (by C-G formula based on Cr of 0.96).  Anti-infectives    Start     Dose/Rate Route Frequency Ordered Stop   09/09/15 2000  piperacillin-tazobactam (ZOSYN) IVPB 3.375 g     3.375 g 12.5 mL/hr over 240 Minutes Intravenous Every 8 hours 09/09/15 1709     09/09/15 1200  piperacillin-tazobactam (ZOSYN) IVPB 3.375 g     3.375 g 100 mL/hr over 30 Minutes Intravenous  Once 09/09/15 1152 09/09/15 1244     Assessment / HPI: Pt c/o LLQ pain.  CT scan shows diverticulitis with contained perforation.  Asked to initiate Zosyn.  Renal fxn OK.   Goal of Therapy:  Eradicate infection.  Plan:  Zosyn 3.375gm IV q8h, EID Monitor labs, renal fxn, progress and c/s  Ena Dawley, [facility] 09/09/2015,5:12 PM

## 2015-09-09 NOTE — ED Notes (Signed)
Pt drinking contrast at this time.

## 2015-09-09 NOTE — Progress Notes (Signed)
Initial Nutrition Assessment  DOCUMENTATION CODES:  Severe malnutrition in context of acute illness/injury, Obesity unspecified   Pt meets criteria for SEVERE MALNUTRITION in the context of Acute illness as evidenced by loss of >5% bw in 2.5 weeks and an oral intake that met < or equal to 50% of estimated needs for > or equal to 5 days.  INTERVENTION:  Mvi with minerals due to prolonged minimal PO intake w/o supplementation  Protein snacks in between meals as diet order allows.   Gave education on low fiber diet now and a high fiber diet after diverticular episode has resolved  Diet preferences  NUTRITION DIAGNOSIS:  Inadequate oral intake related to poor appetite+ Severe abdominal pain as evidenced by loss of >5% bw in 2.5 weeks.   GOAL:  Patient will meet greater than or equal to 90% of their needs  MONITOR:  PO intake, Supplement acceptance  REASON FOR ASSESSMENT:  Malnutrition Screening Tool    ASSESSMENT:  56 y/o male PMHx HTN and recurrent diverticulitis who presented to ED with LLQ Abd pain that began 2 months ago. Associated symptoms: Poor appetite, hematuria.  Pt admitted and to be evaluated by general surgery. Likely has contained perforation.  Spoke with pt and wife. Pt reports eating 25% of his normal amount "since I got sick" which he says was roughly 3 months ago. He has been limited to very soft foods such as PB, eggs, peaches. He says it has been >3 months since he has had meat. He did not follow any type of diet and ate "pretty much what I wanted". He did not take any vitamins/minerals or oral nutritional drinks  Pt states his UBW was ~160 lbs 3 months ago. Per EMR documentation he has lost 15 lbs (>5% bw) in the last [redacted] weeks along. His minimal intake and degree of wt loss would diagnose him w/ severe malnutrition.   RD explained importance of optimal nutritional status, especially if he is to undergo surgical intervention.   He was agreeable to Protein snacks  in between meals and whole milk on his meal trays.   Labs reviewed: Minimal Abnormalities  NFPE: WDL  Diet Order:  Diet Heart Room service appropriate?: Yes; Fluid consistency:: Thin  Skin:  Reviewed, no issues  Last BM:  3/24-bloody  Height:  Ht Readings from Last 1 Encounters:  09/09/15 6' (1.829 m)   Weight:  Wt Readings from Last 1 Encounters:  09/09/15 235 lb (106.595 kg)   Wt Readings from Last 10 Encounters:  09/09/15 235 lb (106.595 kg)  08/22/15 250 lb (113.399 kg)   Ideal Body Weight:  80.91 kg  BMI:  Body mass index is 31.86 kg/(m^2).  Estimated Nutritional Needs:  Kcal:  2000-2250 (19-21 kcal/kg bw) Protein:  81-97 g Pro (1-1.2 g/kg bw) Fluid:  >2 liters  EDUCATION NEEDS:  Education needs addressed  Burtis Junes RD, LDN Clinical Nutrition Pager: [phone] 09/09/2015 3:08 PM

## 2015-09-09 NOTE — ED Provider Notes (Signed)
CSN: [id number]     Arrival date & time 09/09/15  [id number] History  By signing my name below, I, Terressa Koyanagi, attest that this documentation has been prepared under the direction and in the presence of Jola Schmidt, MD. Electronically Signed: Terressa Koyanagi, ED Scribe. 09/09/2015. 9:57 AM.   Chief Complaint  Patient presents with  . Abdominal Pain   HPI PCP: [name], MD HPI Comments: Ankush Widrig is a 55 y.o. male, with PMHx noted below including diverticulitis, who presents to the Emergency Department complaining of persistent LLQ abd pain onset 2 months ago. Associated Sx include decreased appetite, hematuria. Pt reports he is currently on Lialda and amoxacillin without improvement; pt states he has been on 4 separate antibiotics and none of them have helped, he fears that he may "be immune" to antibiotics. Pt further notes that he is compliant with all medical recommendations including soft diet, no EtOH use, however, none of it has improved his Sx. Pt denies fever.   Past Medical History  Diagnosis Date  . Diverticulitis   . Hypertension    History reviewed. No pertinent past surgical history. No family history on file. Social History  Substance Use Topics  . Smoking status: Never Smoker   . Smokeless tobacco: None  . Alcohol Use: Yes     Comment: occ    Review of Systems  A complete 10 system review of systems was obtained and all systems are negative except as noted in the HPI and PMH.   Allergies  Review of patient's allergies indicates no known allergies.  Home Medications   Prior to Admission medications   Medication Sig Start Date End Date Taking? Authorizing Provider  amoxicillin-clavulanate (AUGMENTIN) 500-125 MG tablet Take 1 tablet by mouth 3 (three) times daily.   Yes Historical Provider, MD  ibuprofen (ADVIL,MOTRIN) 200 MG tablet Take 400 mg by mouth every 6 (six) hours as needed.   Yes Historical Provider, MD  lisinopril (PRINIVIL,ZESTRIL) 10 MG tablet  Take 10 mg by mouth daily.   Yes Historical Provider, MD  meloxicam (MOBIC) 15 MG tablet Take 15 mg by mouth every evening.    Yes Historical Provider, MD  ondansetron (ZOFRAN) 4 MG tablet Take 4 mg by mouth every 6 (six) hours as needed. 09/01/15  Yes Historical Provider, MD  Probiotic Product ([place]) CAPS Take 1 capsule by mouth daily.   Yes Historical Provider, MD  metroNIDAZOLE (FLAGYL) 500 MG tablet Take 1 tablet (500 mg total) by mouth 3 (three) times daily. Patient not taking: Reported on 09/09/2015 [id number]   Delora Fuel, MD  oxyCODONE-acetaminophen (PERCOCET) 5-325 MG tablet Take 1 tablet by mouth every 4 (four) hours as needed for moderate pain. Patient not taking: Reported on 09/09/2015 [id number]   Delora Fuel, MD   Triage Vitals: BP 143/101 mmHg  Pulse 67  Temp(Src) 98 F (36.7 C) (Oral)  Resp 20  Ht 6' (1.829 m)  Wt 235 lb (106.595 kg)  BMI 31.86 kg/m2  SpO2 98% Physical Exam  Constitutional: He is oriented to person, place, and time. He appears well-developed and well-nourished.  HENT:  Head: Normocephalic and atraumatic.  Eyes: EOM are normal.  Neck: Normal range of motion.  Cardiovascular: Normal rate, regular rhythm, normal heart sounds and intact distal pulses.   Pulmonary/Chest: Effort normal and breath sounds normal. No respiratory distress.  Abdominal: Soft. He exhibits no distension. There is tenderness in the suprapubic area and left lower quadrant.  Musculoskeletal: Normal range of motion.  Neurological:  He is alert and oriented to person, place, and time.  Skin: Skin is warm and dry.  Psychiatric: He has a normal mood and affect. Judgment normal.  Nursing note and vitals reviewed.   ED Course  Procedures (including critical care time) DIAGNOSTIC STUDIES: Oxygen Saturation is 98% on ra, nl by my interpretation.    COORDINATION OF CARE: 9:53 AM: Discussed treatment plan which includes CT scan and meds for pain management with pt at bedside;  patient verbalizes understanding and agrees with treatment plan.  Labs Review Labs Reviewed  COMPREHENSIVE METABOLIC PANEL - Abnormal; Notable for the following:    Glucose, Bld 124 (*)    ALT 12 (*)    All other components within normal limits  URINALYSIS, ROUTINE W REFLEX MICROSCOPIC (NOT AT [facility]) - Abnormal; Notable for the following:    APPearance HAZY (*)    Bilirubin Urine SMALL (*)    Ketones, ur 15 (*)    All other components within normal limits  LIPASE, BLOOD  CBC    Imaging Review Ct Abdomen Pelvis W Contrast  09/09/2015  CLINICAL DATA:  Abdominal pain, had colonoscopy at an outside facility recently, left lower quadrant pain EXAM: CT ABDOMEN AND PELVIS WITH CONTRAST TECHNIQUE: Multidetector CT imaging of the abdomen and pelvis was performed using the standard protocol following bolus administration of intravenous contrast. CONTRAST:  139mL OMNIPAQUE IOHEXOL 300 MG/ML SOLN, 13mL OMNIPAQUE IOHEXOL 300 MG/ML SOLN COMPARISON:  08/22/2015 FINDINGS: Lower chest:  The visualized portions of the lung bases are clear. Hepatobiliary: Mild diffuse steatosis of the liver. Gallbladder is normal. Pancreas: Mild fatty atrophy of the pancreas. Spleen: Spleen is normal Adrenals/Urinary Tract: Adrenal glands are normal. Kidneys are normal. No hydronephrosis. Bladder shows wall thickening particularly at the dome similar to prior study. Stomach/Bowel: There is severe wall thickening involving the sigmoid colon proximal half, the same area that was previously involved. There is surrounding inflammatory change. There is a nonobstructive bowel gas pattern. There is stool throughout the colon. No other bowel abnormalities. Vascular/Lymphatic: No significant vascular abnormality. There are prominent lymph nodes in the peritoneal fat adjacent to the sigmoid colon measuring up to 9 mm with surrounding inflammatory change. At the root of the mesentery there are also mildly prominent lymph nodes measuring up to  10 mm. Reproductive: No significant abnormalities Other: There is a 2.6 cm mildly low-attenuation structure extending between the inferior wall the inflamed sigmoid colon can the dome of the bladder. This was present previously. Musculoskeletal: No acute findings IMPRESSION: Continued severe inflammation of the proximal half of the sigmoid colon. There is a 2.6 cm oval low-attenuation structure extending between the posterior wall of the inflamed sigmoid colon and the dome of the bladder. This could represent a contained perforation or small abscess. Electronically Signed   By: Skipper Cliche M.D.   On: 09/09/2015 11:08   I have personally reviewed and evaluated these images and lab results as part of my medical decision-making.  MDM   Final diagnoses:  Diverticulitis of large intestine with abscess without bleeding    Patient with diverticulitis and likely contained perforation with abscess.  Patient be started on IV Zosyn.  I spoke with Dr. Aviva Signs of general surgery who will see in consultation in the morning.  He requests hospitalist admission and IV antibiotics at this time.  I'm concerned given his prolonged course of illness that he may end up eventually requiring surgery for definitive treatment  I personally performed the services described in this  documentation, which was scribed in my presence. The recorded information has been reviewed and is accurate.       Jola Schmidt, MD 09/09/15 1158

## 2015-09-10 DIAGNOSIS — K5720 Diverticulitis of large intestine with perforation and abscess without bleeding: Principal | ICD-10-CM

## 2015-09-10 DIAGNOSIS — K5732 Diverticulitis of large intestine without perforation or abscess without bleeding: Secondary | ICD-10-CM

## 2015-09-10 DIAGNOSIS — K921 Melena: Secondary | ICD-10-CM

## 2015-09-10 DIAGNOSIS — E43 Unspecified severe protein-calorie malnutrition: Secondary | ICD-10-CM | POA: Insufficient documentation

## 2015-09-10 LAB — C DIFFICILE QUICK SCREEN W PCR REFLEX
C DIFFICILE (CDIFF) INTERP: NEGATIVE
C Diff antigen: NEGATIVE
C Diff toxin: NEGATIVE

## 2015-09-10 NOTE — Consult Note (Signed)
Referring Provider: No ref. provider found Primary Care Physician:  [name], MD Primary Gastroenterologist:  Dr.Pandya  Reason for Consultation: "Diverticulitis"  HPI: Pleasant 55 year old gentleman admitted to the hospital yesterday with sigmoid diverticulitis. Patient started having left lower quadrant abdominal pain back in December of last year intermittently. At that time, he was treated with a Z-Pak for sinus infection. Left lower quadrant pain has insidiously worsened; he was in seen in [place] [facility] and underwent a CT which demonstrated sigmoid diverticulitis by report. He was treated with 2 courses of Cipro and Flagyl. He did not improve after the second course. He was offered hospitalization but declined at that time. Dr. Earley Brooke attempted colonoscopy but apparently could not make it around beyond the inflamed segment. Patient states some small polyps removed. Other biopsies may have been performed. Patient is unaware of biopsy results at this time. Apparently, subsequent to colonoscopy attempt, he has been given a course of Bactrim and Augmentin as well.  Please note patient has never had a complete colonoscopy.   Patient continues to wax and waning left lower quadrant abdominal pain. Over the past several weeks his head upwards to 10 bowel movements daily -  predominantly watery and bloody.  Patient denies any chronic gastrointestinal illnesses such as ulcerative colitis GERD, peptic ulcer disease. He's not having any issues with the dysphagia, GERD nausea or vomiting. Takes Mobic regularly but has not had any the past couple of weeks. No family history of gastrointestinal illness including colorectal neoplasia. He does not smoke.  Here, the patient does not have a leukocytosis has remained afebrile. He is on IV Zosyn.  I have reviewed the 2 CTs done here recently with Dr. Ilda Foil. Patient has rather impressive inflammatory changes of the proximal sigmoid colon. A relatively long  edematous segment. He may have a small abscess on the latest scan. Findings certainly consistent with diverticulitis. However, focal colitis or even an occult tumor in the abnormal segment not excluded.  Past Medical History  Diagnosis Date  . Diverticulitis   . Hypertension     History reviewed. No pertinent past surgical history.  Prior to Admission medications   Medication Sig Start Date End Date Taking? Authorizing Provider  amoxicillin-clavulanate (AUGMENTIN) 500-125 MG tablet Take 1 tablet by mouth 3 (three) times daily.   Yes Historical Provider, MD  ibuprofen (ADVIL,MOTRIN) 200 MG tablet Take 400 mg by mouth every 6 (six) hours as needed.   Yes Historical Provider, MD  lisinopril (PRINIVIL,ZESTRIL) 10 MG tablet Take 10 mg by mouth daily.   Yes Historical Provider, MD  meloxicam (MOBIC) 15 MG tablet Take 15 mg by mouth every evening.    Yes Historical Provider, MD  ondansetron (ZOFRAN) 4 MG tablet Take 4 mg by mouth every 6 (six) hours as needed. 09/01/15  Yes Historical Provider, MD  Probiotic Product ([place]) CAPS Take 1 capsule by mouth daily.   Yes Historical Provider, MD  metroNIDAZOLE (FLAGYL) 500 MG tablet Take 1 tablet (500 mg total) by mouth 3 (three) times daily. Patient not taking: Reported on 09/09/2015 [id number]   Delora Fuel, MD  oxyCODONE-acetaminophen (PERCOCET) 5-325 MG tablet Take 1 tablet by mouth every 4 (four) hours as needed for moderate pain. Patient not taking: Reported on 09/09/2015 [id number]   Delora Fuel, MD    Current Facility-Administered Medications  Medication Dose Route Frequency Provider Last Rate Last Dose  . 0.9 %  sodium chloride infusion  250 mL Intravenous PRN Samuella Cota, MD      .  acetaminophen (TYLENOL) tablet 650 mg  650 mg Oral Q6H PRN Samuella Cota, MD       Or  . acetaminophen (TYLENOL) suppository 650 mg  650 mg Rectal Q6H PRN Samuella Cota, MD      . feeding supplement (ENSURE ENLIVE) (ENSURE ENLIVE) liquid 237  mL  237 mL Oral BID BM Samuella Cota, MD   237 mL at 09/10/15 1000  . HYDROcodone-acetaminophen (NORCO/VICODIN) 5-325 MG per tablet 1-2 tablet  1-2 tablet Oral Q4H PRN Samuella Cota, MD   2 tablet at 09/09/15 2147  . lisinopril (PRINIVIL,ZESTRIL) tablet 10 mg  10 mg Oral Daily Samuella Cota, MD   10 mg at 09/10/15 0906  . morphine 2 MG/ML injection 2 mg  2 mg Intravenous Q4H PRN Samuella Cota, MD      . multivitamin with minerals tablet 1 tablet  1 tablet Oral Daily Samuella Cota, MD   1 tablet at 09/10/15 [phone]  . ondansetron (ZOFRAN) tablet 4 mg  4 mg Oral Q6H PRN Samuella Cota, MD       Or  . ondansetron [facility]) injection 4 mg  4 mg Intravenous Q6H PRN Samuella Cota, MD   4 mg at 09/09/15 1825  . piperacillin-tazobactam (ZOSYN) IVPB 3.375 g  3.375 g Intravenous Q8H Samuella Cota, MD   3.375 g at 09/10/15 0400  . sodium chloride flush (NS) 0.9 % injection 3 mL  3 mL Intravenous Q12H Samuella Cota, MD   3 mL at 09/10/15 1000  . sodium chloride flush (NS) 0.9 % injection 3 mL  3 mL Intravenous PRN Samuella Cota, MD        Allergies as of 09/09/2015  . (No Known Allergies)    Family History  Problem Relation Age of Onset  . Diabetes Father     Social History   Social History  . Marital Status: Married    Spouse Name: N/A  . Number of Children: N/A  . Years of Education: N/A   Occupational History  . Not on file.   Social History Main Topics  . Smoking status: Never Smoker   . Smokeless tobacco: Not on file  . Alcohol Use: Yes     Comment: occ  . Drug Use: No  . Sexual Activity: Not on file   Other Topics Concern  . Not on file   Social History Narrative   Lives in [place]. Works for [employer]. Married with one child. Rare consumption of a Beer. No tobacco or illicit drugs.  Review of Systems: See below and in history of present illness  CV: Denies chest pain, angina, palpitations, syncope, orthopnea, PND,  peripheral edema, and claudication. Resp: Denies dyspnea at rest, dyspnea with exercise, cough, sputum, wheezing, coughing up blood, and pleurisy. GI: Denies vomiting blood, jaundice, and fecal incontinence.   Denies dysphagia or odynophagia, GERD. Derm: Denies rash, itching, dry skin, hives, moles, warts, or unhealing ulcers.   Heme: Denies bruising, bleeding, and enlarged lymph nodes. Physical Exam: Vital signs in last 24 hours: Temp:  [97.5 F (36.4 C)-97.6 F (36.4 C)] 97.6 F (36.4 C) (03/24 2102) Pulse Rate:  [55-71] 59 (03/24 2102) Resp:  [16-20] 20 (03/24 2102) BP: (122-152)/(83-98) 136/83 mmHg (03/24 2102) SpO2:  [95 %-100 %] 98 % (03/24 2102) Last BM Date: 09/09/15 General:   Alert,  Well-developed, , pleasant and cooperative in NAD.  Accompanied by wife and brother. Seen with Dr. Arnoldo Morale Head:  Normocephalic and atraumatic. Eyes:  Sclera clear, no icterus.   Conjunctiva pink. Ears:  Normal auditory acuity. Nose:  No deformity, discharge,  or lesions. Mouth:  No deformity or lesions, dentition normal. Neck:  Supple; no masses or thyromegaly. Lungs:  Clear throughout to auscultation.   No wheezes, crackles, or rhonchi. No acute distress. Heart:  Regular rate and rhythm; no murmurs, clicks, rubs,  or gallops. Abdomen:  Nondistended. Bowel sounds present somewhat in frequent. Localized left lower quadrant abdominal pain to palpation. Some guarding. No rebound. No mass appreciated   Extremities:  Without clubbing or edema. Intake/Output from previous day: 03/24 0701 - 03/25 0700 In: 340 [P.O.:240; IV Piggyback:100] Out: 1 [Stool:1] Intake/Output this shift:    Lab Results:  Recent Labs  09/09/15 0928  WBC 6.2  HGB 14.5  HCT 43.4  PLT 367   BMET  Recent Labs  09/09/15 0928  NA 140  K 3.8  CL 104  CO2 25  GLUCOSE 124*  BUN 11  CREATININE 0.96  CALCIUM 9.0   LFT  Recent Labs  09/09/15 0928  PROT 7.2  ALBUMIN 3.5  AST 15  ALT 12*  ALKPHOS 60    BILITOT 0.5  Studies/Results: Ct Abdomen Pelvis W Contrast  09/09/2015  CLINICAL DATA:  Abdominal pain, had colonoscopy at an outside facility recently, left lower quadrant pain EXAM: CT ABDOMEN AND PELVIS WITH CONTRAST TECHNIQUE: Multidetector CT imaging of the abdomen and pelvis was performed using the standard protocol following bolus administration of intravenous contrast. CONTRAST:  131mL OMNIPAQUE IOHEXOL 300 MG/ML SOLN, 43mL OMNIPAQUE IOHEXOL 300 MG/ML SOLN COMPARISON:  08/22/2015 FINDINGS: Lower chest:  The visualized portions of the lung bases are clear. Hepatobiliary: Mild diffuse steatosis of the liver. Gallbladder is normal. Pancreas: Mild fatty atrophy of the pancreas. Spleen: Spleen is normal Adrenals/Urinary Tract: Adrenal glands are normal. Kidneys are normal. No hydronephrosis. Bladder shows wall thickening particularly at the dome similar to prior study. Stomach/Bowel: There is severe wall thickening involving the sigmoid colon proximal half, the same area that was previously involved. There is surrounding inflammatory change. There is a nonobstructive bowel gas pattern. There is stool throughout the colon. No other bowel abnormalities. Vascular/Lymphatic: No significant vascular abnormality. There are prominent lymph nodes in the peritoneal fat adjacent to the sigmoid colon measuring up to 9 mm with surrounding inflammatory change. At the root of the mesentery there are also mildly prominent lymph nodes measuring up to 10 mm. Reproductive: No significant abnormalities Other: There is a 2.6 cm mildly low-attenuation structure extending between the inferior wall the inflamed sigmoid colon can the dome of the bladder. This was present previously. Musculoskeletal: No acute findings IMPRESSION: Continued severe inflammation of the proximal half of the sigmoid colon. There is a 2.6 cm oval low-attenuation structure extending between the posterior wall of the inflamed sigmoid colon and the dome of  the bladder. This could represent a contained perforation or small abscess. Electronically Signed   By: Skipper Cliche M.D.   On: 09/09/2015 11:08     Impression:  Mr. Pearl is a very pleasant 55 year old gentleman from [place] admitted to the hospital with a clinical picture consistent with recalcitrant sigmoid diverticulitis. At this time, he does not look toxic or septic. In fact, he looks considerably better than his CT scan would comply. He is afebrile and does not have a leukocytosis. I am concerned about rectal bleeding and some diarrhea recently. Hemoglobin remains very much in the normal range. He's been exposed  to multiple antibiotics. We need to rule out C. difficile superimposed on diverticulitis. He's never had a complete colonoscopy by history;  An occult tumor in his colon is not absolutely excluded at this time.  We are not going to have the luxury of performing a colonoscopy preoperatively.  We need to keep in mind NSAID use can can cause a de novo colitis.  Coexisting chronic segmental ischemia could also be in the mix. He is ultimately going to need a sigmoid resection.   Recommendations:  Agree with IV antibiotics and nothing by mouth status for the time being.    Would withhold NSAIDs for the time being    C. difficile toxin assay on stool    Obtain records from Dr. Milas Kocher office in [place]    Timing of surgery to be determined by Dr. Arnoldo Morale.    He will need a complete colonoscopy postoperatively.  I'd like to thank Dr. Sarajane Jews for allowing me to see this nice gentleman today.

## 2015-09-10 NOTE — Consult Note (Signed)
Patient seen, chart reviewed. Full consult to follow. Discussed management with patient and Dr. Gala Romney.

## 2015-09-10 NOTE — Progress Notes (Signed)
Patient briefly seen and examined, database reviewed. Admitted yesterday for complaints of abdominal pain. CT scan showed diverticulitis of the sigmoid colon with contained perforation versus abscess. Has been started on IV Zosyn. Leukocytosis has resolved. Has been seen in consultation by both GI and surgery. Recommendation is to continue antibiotics over weekend and potentially plan for surgical resection at some point in the near future. Timing of this is to be determined by Dr. Arnoldo Morale. Will continue to follow closely.  Domingo Mend, MD [facility] Pager: [phone]

## 2015-09-11 NOTE — Progress Notes (Signed)
Patient had a good night. States left lower quadrant abdominal pain slightly better. A couple of loose slightly bloody BMs per patient report.  C. difficile toxin assay negative.  Patient has been advanced from nothing by mouth to a full liquid diet. Graft discussed at length with Dr. Arnoldo Morale yesterday.   Vital signs in last 24 hours: Temp:  [98.4 F (36.9 C)-98.9 F (37.2 C)] 98.7 F (37.1 C) (03/26 0617) Pulse Rate:  [64-72] 72 (03/26 0617) Resp:  [16-20] 17 (03/26 0617) BP: (121-123)/(61-75) 121/75 mmHg (03/26 0617) SpO2:  [97 %] 97 % (03/26 0617) Last BM Date: 09/10/15 General:   Alert,  Well-developed, well-nourished, pleasant and cooperative in NAD Abdomen:  Nondistended. Positive bowel sounds. Persisting left lower quadrant tenderness. Without mass or rebound. Slightly less tender than yesterday.  Extremities:  Without clubbing or edema.    Intake/Output from previous day: 03/25 0701 - 03/26 0700 In: 340 [P.O.:240; IV Piggyback:100] Out: -  Intake/Output this shift:    Lab Results:  Recent Labs  09/09/15 0928  WBC 6.2  HGB 14.5  HCT 43.4  PLT 367   BMET  Recent Labs  09/09/15 0928  NA 140  K 3.8  CL 104  CO2 25  GLUCOSE 124*  BUN 11  CREATININE 0.96  CALCIUM 9.0   LFT  Recent Labs  09/09/15 0928  PROT 7.2  ALBUMIN 3.5  AST 15  ALT 12*  ALKPHOS 67  BILITOT 0.5   PT/INR No results for input(s): LABPROT, INR in the last 72 hours. Hepatitis Panel No results for input(s): HEPBSAG, HCVAB, HEPAIGM, HEPBIGM in the last 72 hours. C-Diff  Recent Labs  09/10/15 1435  CDIFFTOX NEGATIVE    Impression: Recalcitrant sigmoid diverticulitis. Rectal bleeding. Hemoglobin remains normal. C. difficile toxin assay negative.  Recommendations:  Agree with plans for eventual sigmoid colectomy. Hopefully, he will cool down so that a colostomy can be avoided. Timing of surgery per Dr. Arnoldo Morale. He will need an early interval follow-up colonoscopy  postoperatively.

## 2015-09-11 NOTE — Consult Note (Signed)
Reason for Consult: Recurrent sigmoid diverticulitis Referring Physician: Hospitalist, Dr. Ether Griffins Callands is an 55 y.o. male.  HPI: 55 year old white male from [place] who presents with recurrent sigmoid diverticulitis. He has had multiple episodes recently and has been on I think 2 rounds of antibiotics. He has been followed by GI in [place] in the past. Please see Dr. Roseanne Kaufman note. I have been asked to see him concerning possible partial colectomy. I did see the patient in consult yesterday. Today, he states he feels better. His abdominal pain has eased. He has had several bowel movements, with less blood noted.  Past Medical History  Diagnosis Date  . Diverticulitis   . Hypertension     History reviewed. No pertinent past surgical history.  Family History  Problem Relation Age of Onset  . Diabetes Father     Social History:  reports that he has never smoked. He does not have any smokeless tobacco history on file. He reports that he drinks alcohol. He reports that he does not use illicit drugs.  Allergies: No Known Allergies  Medications: I have reviewed the patient's current medications.  Results for orders placed or performed during the hospital encounter of 09/09/15 (from the past 48 hour(s))  Urinalysis, Routine w reflex microscopic (not at [facility])     Status: Abnormal   Collection Time: 09/09/15 10:24 AM  Result Value Ref Range   Color, Urine YELLOW YELLOW   APPearance HAZY (A) CLEAR   Specific Gravity, Urine 1.020 1.005 - 1.030   pH 5.0 5.0 - 8.0   Glucose, UA NEGATIVE NEGATIVE mg/dL   Hgb urine dipstick NEGATIVE NEGATIVE   Bilirubin Urine SMALL (A) NEGATIVE   Ketones, ur 15 (A) NEGATIVE mg/dL   Protein, ur NEGATIVE NEGATIVE mg/dL   Nitrite NEGATIVE NEGATIVE   Leukocytes, UA NEGATIVE NEGATIVE    Comment: MICROSCOPIC NOT DONE ON URINES WITH NEGATIVE PROTEIN, BLOOD, LEUKOCYTES, NITRITE, OR GLUCOSE <1000 mg/dL.  C difficile quick scan w PCR reflex      Status: None   Collection Time: 09/10/15  2:35 PM  Result Value Ref Range   C Diff antigen NEGATIVE NEGATIVE   C Diff toxin NEGATIVE NEGATIVE   C Diff interpretation Negative for toxigenic C. difficile     Ct Abdomen Pelvis W Contrast  09/09/2015  CLINICAL DATA:  Abdominal pain, had colonoscopy at an outside facility recently, left lower quadrant pain EXAM: CT ABDOMEN AND PELVIS WITH CONTRAST TECHNIQUE: Multidetector CT imaging of the abdomen and pelvis was performed using the standard protocol following bolus administration of intravenous contrast. CONTRAST:  145mL OMNIPAQUE IOHEXOL 300 MG/ML SOLN, 65mL OMNIPAQUE IOHEXOL 300 MG/ML SOLN COMPARISON:  08/22/2015 FINDINGS: Lower chest:  The visualized portions of the lung bases are clear. Hepatobiliary: Mild diffuse steatosis of the liver. Gallbladder is normal. Pancreas: Mild fatty atrophy of the pancreas. Spleen: Spleen is normal Adrenals/Urinary Tract: Adrenal glands are normal. Kidneys are normal. No hydronephrosis. Bladder shows wall thickening particularly at the dome similar to prior study. Stomach/Bowel: There is severe wall thickening involving the sigmoid colon proximal half, the same area that was previously involved. There is surrounding inflammatory change. There is a nonobstructive bowel gas pattern. There is stool throughout the colon. No other bowel abnormalities. Vascular/Lymphatic: No significant vascular abnormality. There are prominent lymph nodes in the peritoneal fat adjacent to the sigmoid colon measuring up to 9 mm with surrounding inflammatory change. At the root of the mesentery there are also mildly prominent lymph nodes measuring up  to 10 mm. Reproductive: No significant abnormalities Other: There is a 2.6 cm mildly low-attenuation structure extending between the inferior wall the inflamed sigmoid colon can the dome of the bladder. This was present previously. Musculoskeletal: No acute findings IMPRESSION: Continued severe  inflammation of the proximal half of the sigmoid colon. There is a 2.6 cm oval low-attenuation structure extending between the posterior wall of the inflamed sigmoid colon and the dome of the bladder. This could represent a contained perforation or small abscess. Electronically Signed   By: Skipper Cliche M.D.   On: 09/09/2015 11:08    ROS: See chart Blood pressure 121/75, pulse 72, temperature 98.7 F (37.1 C), temperature source Oral, resp. rate 17, height 6' (1.829 m), weight 106.595 kg (235 lb), SpO2 97 %. Physical Exam: Pleasant white male in no acute distress. Abdomen is remarkably soft, nontender, nondistended. No hepatosplenomegaly, masses, hernias identified.  Assessment/Plan: Impression: Recurrent sigmoid diverticulitis, improving Plan: Agree with need for outpatient sigmoid colectomy. Awaiting records from [place]. We'll advance to full liquid diet. Awaiting C. Difficile toxin.  Jamyiah Labella,Damone A 09/11/2015, 10:15 AM

## 2015-09-11 NOTE — Progress Notes (Signed)
[facility] PROGRESS NOTE  Derek Rodriguez [id number] DOB: 1960/09/18 DOA: 09/09/2015 PCP: [name], MD  Assessment/Plan: Acute Diverticulitis with contained microperforation -Seen by GI and surgery. -Plan to continue antibiotics and allow this to "cool down" prior to proceeding with elective sigmoid colectomy in the near future. -C Diff PCR negative. -Appreciate GI and surgical input and recommendations. -Diet advanced to full liquids  HTN -Well controlled.  Code Status: Full Code Family Communication: Wife at bedside updated on plan of care and all questions answered.  Disposition Plan: Home when ready, anticipate 24-48 hours.   Consultants:  GI  Surgery    Antibiotics:  Zosyn   Subjective: Feels better, some mild LLQ pain with eating.  Objective: Filed Vitals:   09/10/15 1507 09/10/15 2055 09/11/15 0617 09/11/15 1400  BP: 123/71 122/61 121/75 107/77  Pulse: 68 64 72 66  Temp: 98.4 F (36.9 C) 98.9 F (37.2 C) 98.7 F (37.1 C) 98.2 F (36.8 C)  TempSrc:  Oral Oral   Resp: 16 20 17 16   Height:      Weight:      SpO2: 97% 97% 97% 98%    Intake/Output Summary (Last 24 hours) at 09/11/15 1600 Last data filed at 09/11/15 1300  Gross per 24 hour  Intake    340 ml  Output      0 ml  Net    340 ml   Filed Weights   09/09/15 0926  Weight: 106.595 kg (235 lb)    Exam:   General:  AA Ox3  Cardiovascular: RRR  Respiratory: CTA B  Abdomen: S/TTP LLQ/+BS/ND/no rebound or guarding  Extremities: no C/C/E/+pulses   Neurologic:  Grossly intact and non-focal  Data Reviewed: Basic Metabolic Panel:  Recent Labs Lab 09/09/15 0928  NA 140  K 3.8  CL 104  CO2 25  GLUCOSE 124*  BUN 11  CREATININE 0.96  CALCIUM 9.0   Liver Function Tests:  Recent Labs Lab 09/09/15 0928  AST 15  ALT 12*  ALKPHOS 67  BILITOT 0.5  PROT 7.2  ALBUMIN 3.5    Recent Labs Lab 09/09/15 0928  LIPASE 23   No results for input(s): AMMONIA  in the last 168 hours. CBC:  Recent Labs Lab 09/09/15 0928  WBC 6.2  HGB 14.5  HCT 43.4  MCV 79.3  PLT 367   Cardiac Enzymes: No results for input(s): CKTOTAL, CKMB, CKMBINDEX, TROPONINI in the last 168 hours. BNP (last 3 results) No results for input(s): BNP in the last 8760 hours.  ProBNP (last 3 results) No results for input(s): PROBNP in the last 8760 hours.  CBG: No results for input(s): GLUCAP in the last 168 hours.  Recent Results (from the past 240 hour(s))  C difficile quick scan w PCR reflex     Status: None   Collection Time: 09/10/15  2:35 PM  Result Value Ref Range Status   C Diff antigen NEGATIVE NEGATIVE Final   C Diff toxin NEGATIVE NEGATIVE Final   C Diff interpretation Negative for toxigenic C. difficile  Final     Studies: No results found.  Scheduled Meds: . feeding supplement (ENSURE ENLIVE)  237 mL Oral BID BM  . lisinopril  10 mg Oral Daily  . multivitamin with minerals  1 tablet Oral Daily  . piperacillin-tazobactam (ZOSYN)  IV  3.375 g Intravenous Q8H  . sodium chloride flush  3 mL Intravenous Q12H   Continuous Infusions:   Principal Problem:   Diverticulitis of colon with  perforation Active Problems:   Acute diverticulitis   Colonic diverticular abscess   Protein-calorie malnutrition, severe    Time spent: 25 minutes. Greater than 50% of this time was spent in direct contact with the patient coordinating care.    Lelon Frohlich  [facility] Pager [phone]  If 7PM-7AM, please contact night-coverage at [email or web address], password [facility] 09/11/2015, 4:00 PM  LOS: 2 days

## 2015-09-12 ENCOUNTER — Telehealth: Payer: Self-pay | Admitting: Gastroenterology

## 2015-09-12 DIAGNOSIS — K5792 Diverticulitis of intestine, part unspecified, without perforation or abscess without bleeding: Secondary | ICD-10-CM

## 2015-09-12 MED ORDER — CIPROFLOXACIN HCL 500 MG PO TABS: 500.0000 mg | ORAL_TABLET | Freq: Two times a day (BID) | ORAL | Status: DC

## 2015-09-12 MED ORDER — METRONIDAZOLE 500 MG PO TABS: 500.0000 mg | ORAL_TABLET | Freq: Three times a day (TID) | ORAL | Status: DC

## 2015-09-12 MED ORDER — ONDANSETRON HCL 4 MG PO TABS: 4.0000 mg | ORAL_TABLET | Freq: Four times a day (QID) | ORAL | Status: DC | PRN

## 2015-09-12 MED ORDER — HYDROCODONE-ACETAMINOPHEN 5-325 MG PO TABS: 1.0000 | ORAL_TABLET | ORAL | Status: DC | PRN

## 2015-09-12 NOTE — Progress Notes (Signed)
Clinically, patient is doing well. He has less abdominal pain and is tolerating a full liquid diet. Abdominal examination is unremarkable. His diverticulitis seems to be improving. He may be discharged home today. He should go with 1 additional week of Augmentin to complete his course. I will see him in my office next week for follow-up and schedule outpatient elective surgery at that time.

## 2015-09-12 NOTE — Discharge Summary (Signed)
Physician Discharge Summary  Derek Rodriguez [id number] DOB: 1960/12/10 DOA: 09/09/2015  PCP: [name], MD  Admit date: 09/09/2015 Discharge date: 09/12/2015  Time spent: 45 minutes  Recommendations for Outpatient Follow-up:  -Will be discharged home today. -Will follow up with Dr. Arnoldo Morale in 1 week.   Discharge Diagnoses:  Principal Problem:   Diverticulitis of colon with perforation Active Problems:   Acute diverticulitis   Colonic diverticular abscess   Protein-calorie malnutrition, severe   Discharge Condition: Stable and improved  Filed Weights   09/09/15 0926  Weight: 106.595 kg (235 lb)    History of present illness:  As per Dr. Sarajane Jews on 3/24: 50yom PMH HTN presented to ED with ongoing left lower quadrant abdominal pain. CT scan demonstrated diverticulitis with contained perforation. Admitted for IV antibiotics and general surgical consultation.  Patient became sick at the end of the year around Christmas time 2016. January 2017 he had a CAT scan done which showed diverticulitis and he was treated with ciprofloxacin and Flagyl for 2 weeks. He had brief improvement but again developed left lower quadrant pain and had several more rounds of antibiotics including the above as well as Bactrim. He was seen in the emergency department early March at [facility] and discharged on antibiotics. He saw gastroenterologist in [place] and had a colonoscopy performed March 16 which was limited secondary to inflammation. He has not heard the results of this yet.  For the last month he's had blood with bowel movements, frequent bowel movements up to 12 times during a 12 hour work shift. Bowel movements are liquid. Last normal bowel movement more than one month ago. No history of inflammatory bowel disease or other bowel problems. No family history of bowel problems. No specific alleviating factors. Symptoms seem to worsen with eating although now he has pain constantly.  In  the emergency department afebrile, VSS Pertinent labs: CMP and CBC unremarkable, lipase normal. Imaging: per radiolology: Continued severe inflammation of the proximal half of the sigmoid colon. There is a 2.6 cm oval low-attenuation structure extending between the posterior wall of the inflamed sigmoid colon and the dome of the bladder. This could represent a contained perforation or small abscess.  Hospital Course:   Acute Diverticulitis with contained microperforation -Seen by GI and surgery. -Plan to continue antibiotics and allow this to "cool down" prior to proceeding with elective sigmoid colectomy in the near future. -C Diff PCR negative. -Appreciate GI and surgical input and recommendations. -Diet advanced to full liquids, tolerating well. -Will continue cipro/flagyl for 1 week.  HTN -Well controlled.   Procedures:  None   Consultations:  None  Discharge Instructions  Discharge Instructions    Increase activity slowly    Complete by:  As directed             Medication List    STOP taking these medications        amoxicillin-clavulanate 500-125 MG tablet  Commonly known as:  AUGMENTIN     ibuprofen 200 MG tablet  Commonly known as:  ADVIL,MOTRIN     meloxicam 15 MG tablet  Commonly known as:  MOBIC     oxyCODONE-acetaminophen 5-325 MG tablet  Commonly known as:  PERCOCET     [facility]ON [facility] Caps      TAKE these medications        ciprofloxacin 500 MG tablet  Commonly known as:  CIPRO  Take 1 tablet (500 mg total) by mouth 2 (two) times daily.  HYDROcodone-acetaminophen 5-325 MG tablet  Commonly known as:  NORCO/VICODIN  Take 1-2 tablets by mouth every 4 (four) hours as needed for moderate pain.     lisinopril 10 MG tablet  Commonly known as:  PRINIVIL,ZESTRIL  Take 10 mg by mouth daily.     metroNIDAZOLE 500 MG tablet  Commonly known as:  FLAGYL  Take 1 tablet (500 mg total) by mouth 3 (three) times daily.     ondansetron 4  MG tablet  Commonly known as:  ZOFRAN  Take 1 tablet (4 mg total) by mouth every 6 (six) hours as needed.       No Known Allergies     Follow-up Information    Follow up with [name], MD On 11/16/2015.   Specialty:  General Surgery   Why:  @ 10:00 with Alonna Minium information:   [id number] [place] [id number] [phone]        The results of significant diagnostics from this hospitalization (including imaging, microbiology, ancillary and laboratory) are listed below for reference.    Significant Diagnostic Studies: Ct Abdomen Pelvis W Contrast  09/09/2015  CLINICAL DATA:  Abdominal pain, had colonoscopy at an outside facility recently, left lower quadrant pain EXAM: CT ABDOMEN AND PELVIS WITH CONTRAST TECHNIQUE: Multidetector CT imaging of the abdomen and pelvis was performed using the standard protocol following bolus administration of intravenous contrast. CONTRAST:  131mL OMNIPAQUE IOHEXOL 300 MG/ML SOLN, 70mL OMNIPAQUE IOHEXOL 300 MG/ML SOLN COMPARISON:  08/22/2015 FINDINGS: Lower chest:  The visualized portions of the lung bases are clear. Hepatobiliary: Mild diffuse steatosis of the liver. Gallbladder is normal. Pancreas: Mild fatty atrophy of the pancreas. Spleen: Spleen is normal Adrenals/Urinary Tract: Adrenal glands are normal. Kidneys are normal. No hydronephrosis. Bladder shows wall thickening particularly at the dome similar to prior study. Stomach/Bowel: There is severe wall thickening involving the sigmoid colon proximal half, the same area that was previously involved. There is surrounding inflammatory change. There is a nonobstructive bowel gas pattern. There is stool throughout the colon. No other bowel abnormalities. Vascular/Lymphatic: No significant vascular abnormality. There are prominent lymph nodes in the peritoneal fat adjacent to the sigmoid colon measuring up to 9 mm with surrounding inflammatory change. At the root of the mesentery there  are also mildly prominent lymph nodes measuring up to 10 mm. Reproductive: No significant abnormalities Other: There is a 2.6 cm mildly low-attenuation structure extending between the inferior wall the inflamed sigmoid colon can the dome of the bladder. This was present previously. Musculoskeletal: No acute findings IMPRESSION: Continued severe inflammation of the proximal half of the sigmoid colon. There is a 2.6 cm oval low-attenuation structure extending between the posterior wall of the inflamed sigmoid colon and the dome of the bladder. This could represent a contained perforation or small abscess. Electronically Signed   By: Skipper Cliche M.D.   On: 09/09/2015 11:08   Ct Abdomen Pelvis W Contrast  08/22/2015  CLINICAL DATA:  Left lower quadrant pain.  History diverticulitis EXAM: CT ABDOMEN AND PELVIS WITH CONTRAST TECHNIQUE: Multidetector CT imaging of the abdomen and pelvis was performed using the standard protocol following bolus administration of intravenous contrast. CONTRAST:  75mL OMNIPAQUE IOHEXOL 300 MG/ML SOLN, 152mL OMNIPAQUE IOHEXOL 300 MG/ML SOLN COMPARISON:  None. FINDINGS: Lower chest:  Negative Hepatobiliary: Fatty infiltration of the liver. No focal liver lesion. Liver size and contour normal. Gallbladder and bile ducts normal. Pancreas: Atrophic pancreatic changes without edema or mass. No pancreatic calcifications. Spleen: Negative  Adrenals/Urinary Tract: Symmetric renal contrast excretion bilaterally. No mass or obstruction. No renal stone. New thickening of the dome of bladder likely due to adjacent sigmoid colon diverticulitis. Stomach/Bowel: Segmental thickening of the sigmoid colon with associated pericolonic edema. Scattered diverticula are present in the descending colon and sigmoid colon. Findings consistent with diverticulitis. No abscess. No free air. Vascular/Lymphatic: Normal aorta and iliac arteries. No aneurysm. No lymphadenopathy Reproductive: Mild prostate enlargement.  Other: No free fluid. 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 No fracture. No acute bony process. IMPRESSION: Sigmoid diverticulitis. No abscess or free air. There is thickening of the dome of the bladder consistent with adjacent inflammation from diverticulitis. Mild fatty infiltration liver.  Atrophic pancreatic changes. Electronically Signed   By: Franchot Gallo M.D.   On: 08/22/2015 17:44    Microbiology: Recent Results (from the past 240 hour(s))  C difficile quick scan w PCR reflex     Status: None   Collection Time: 09/10/15  2:35 PM  Result Value Ref Range Status   C Diff antigen NEGATIVE NEGATIVE Final   C Diff toxin NEGATIVE NEGATIVE Final   C Diff interpretation Negative for toxigenic C. difficile  Final     Labs: Basic Metabolic Panel:  Recent Labs Lab 09/09/15 0928  NA 140  K 3.8  CL 104  CO2 25  GLUCOSE 124*  BUN 11  CREATININE 0.96  CALCIUM 9.0   Liver Function Tests:  Recent Labs Lab 09/09/15 0928  AST 15  ALT 12*  ALKPHOS 67  BILITOT 0.5  PROT 7.2  ALBUMIN 3.5    Recent Labs Lab 09/09/15 0928  LIPASE 23   No results for input(s): AMMONIA in the last 168 hours. CBC:  Recent Labs Lab 09/09/15 0928  WBC 6.2  HGB 14.5  HCT 43.4  MCV 79.3  PLT 367   Cardiac Enzymes: No results for input(s): CKTOTAL, CKMB, CKMBINDEX, TROPONINI in the last 168 hours. BNP: BNP (last 3 results) No results for input(s): BNP in the last 8760 hours.  ProBNP (last 3 results) No results for input(s): PROBNP in the last 8760 hours.  CBG: No results for input(s): GLUCAP in the last 168 hours.     SignedLelon Frohlich  [facility] Pager: [phone] 09/12/2015, 4:04 PM

## 2015-09-12 NOTE — Progress Notes (Signed)
    Subjective: Pain comes in waves but better overall. Loose stool unchanged. No vomiting. Zofran helping with nausea.   Objective: Vital signs in last 24 hours: Temp:  [98.1 F (36.7 C)-98.4 F (36.9 C)] 98.1 F (36.7 C) (03/27 0537) Pulse Rate:  [66-67] 66 (03/27 0537) Resp:  [16-18] 17 (03/27 0537) BP: (102-138)/(55-87) 102/55 mmHg (03/27 0537) SpO2:  [97 %-99 %] 97 % (03/27 0537) Last BM Date: 09/11/15 General:   Alert and oriented, pleasant Head:  Normocephalic and atraumatic. Eyes:  No icterus, sclera clear. Conjuctiva pink.  Abdomen:  Bowel sounds present, soft, mildly TTP LLQ. No rebound or guarding.  Neurologic:  Alert and  oriented x4;  grossly normal neurologically. Psych:  Alert and cooperative. Normal mood and affect.  Intake/Output from previous day: 03/26 0701 - 03/27 0700 In: 580 [P.O.:480; IV Piggyback:100] Out: -  Intake/Output this shift:    Lab Results:  Recent Labs  09/09/15 0928  WBC 6.2  HGB 14.5  HCT 43.4  PLT 367   BMET  Recent Labs  09/09/15 0928  NA 140  K 3.8  CL 104  CO2 25  GLUCOSE 124*  BUN 11  CREATININE 0.96  CALCIUM 9.0   LFT  Recent Labs  09/09/15 0928  PROT 7.2  ALBUMIN 3.5  AST 15  ALT 12*  ALKPHOS 11  BILITOT 0.5    Assessment: 55 year old with recurrent sigmoid diverticulitis, slowly improving. Tolerating full liquids. Per surgery, appropriate for discharge with outpatient follow-up for discussion of colectomy. Will need complete colonoscopy after colectomy as he has never had a complete colonoscopy. Stable from a GI standpoint. Will follow peripherally and arrange outpatient follow-up in our office in next 6-8 weeks.   Plan: Follow-up with Dr. Arnoldo Morale as outpatient Our office to arrange follow-up for colonoscopy Anticipate discharge home Follow peripherally  Orvil Feil, ANP-BC [facility]    LOS: 3 days    09/12/2015, 7:53 AM

## 2015-09-12 NOTE — Progress Notes (Signed)
Discharge instructions read to pt and his spouse. Both verbalized understanding of all instructions  Discharged to home with spouse.

## 2015-09-12 NOTE — Telephone Encounter (Signed)
Please arrange outpatient follow-up for colonoscopy about 8 weeks from now.

## 2015-09-16 NOTE — H&P (Signed)
Derek Rodriguez is an 55 y.o. male.   Chief Complaint: Recurrent sigmoid diverticulitis, question malignancy HPI: Patient is a 55 year old white male who was admitted to [facility] in March 2017 for treatment of recurrent diverticulitis of the sigmoid colon. He was noted at the time to have hematochezia as well as inflammatory changes in the sigmoid colon region on CT scan. He was also passing blood per rectum. He did have a partial colonoscopy in [place]. Pathology results of biopsy showed possible suspicious cells, but no frank dysplasia or malignancy was noted. He has never had a complete colonoscopy. She had been treated with multiple rounds of antibiotics. He now presents for a partial colectomy.  Past Medical History  Diagnosis Date  . Diverticulitis   . Hypertension     No past surgical history on file.  Family History  Problem Relation Age of Onset  . Diabetes Father    Social History:  reports that he has never smoked. He does not have any smokeless tobacco history on file. He reports that he drinks alcohol. He reports that he does not use illicit drugs.  Allergies: No Known Allergies  Meds: Prinivil, Percocet  No results found for this or any previous visit (from the past 91 hour(s)). No results found.  Review of Systems  Constitutional: Positive for malaise/fatigue.  HENT: Negative.   Eyes: Negative.   Respiratory: Negative.   Cardiovascular: Negative.   Gastrointestinal: Positive for abdominal pain, diarrhea and blood in stool.  Genitourinary: Negative.   Musculoskeletal: Negative.   Skin: Negative.   All other systems reviewed and are negative.   There were no vitals taken for this visit. Physical Exam  Constitutional: He is oriented to person, place, and time. He appears well-developed and well-nourished.  HENT:  Head: Normocephalic and atraumatic.  Neck: Normal range of motion. Neck supple.  Cardiovascular: Normal rate, regular rhythm and normal  heart sounds.   Respiratory: Effort normal and breath sounds normal.  GI: Soft. Bowel sounds are normal. He exhibits no distension. There is no tenderness. There is no rebound.  Neurological: He is alert and oriented to person, place, and time.  Skin: Skin is warm and dry.     Assessment/Plan Impression: Recurrent sigmoid diverticulitis, question malignancy Plan: Patient be taken to the operating room for partial colectomy on 10/03/2015. The risks and benefits of the procedure including bleeding, infection, anastomotic leak, the possibility of a colostomy, and the possibility of finding malignancy were fully explained to the patient, who gave informed consent. TriLyte, neomycin, erythromycin were used for bowel prep.  [name], MD 09/16/2015, 8:37 AM

## 2015-09-26 NOTE — Patient Instructions (Signed)
Derek Rodriguez  09/26/2015     @PREFPERIOPPHARMACY @   Your procedure is scheduled on  10/03/2015   Report to [facility] at  730  A.M.  Call this number if you have problems the morning of surgery:  [phone]   Remember:  Do not eat food or drink liquids after midnight.  Take these medicines the morning of surgery with A SIP OF WATER  Hydrocodone, lisinopril, zofran.   Do not wear jewelry, make-up or nail polish.  Do not wear lotions, powders, or perfumes.  You may wear deodorant.  Do not shave 48 hours prior to surgery.  Men may shave face and neck.  Do not bring valuables to the hospital.  [facility] is not responsible for any belongings or valuables.  Contacts, dentures or bridgework may not be worn into surgery.  Leave your suitcase in the car.  After surgery it may be brought to your room.  For patients admitted to the hospital, discharge time will be determined by your treatment team.  Patients discharged the day of surgery will not be allowed to drive home.   Name and phone number of your driver:   Family    Special instructions:  none  Please read over the following fact sheets that you were given. Coughing and Deep Breathing, Surgical Site Infection Prevention, Anesthesia Post-op Instructions and Care and Recovery After Surgery      Open Colectomy An open colectomy is surgery to take out part or all of the large intestine (colon). This procedure is used to treat several conditions, including:  Inflammation and infection of the colon (diverticulitis).  Tumors or masses in the colon.  Inflammatory bowel disease, such as Crohn disease or ulcerative colitis. Colectomy is an option when symptoms cannot be controlled with medicines.  Bleeding from the colon that cannot be controlled by another method.  Blockage or obstruction of the colon. LET [facility] CARE PROVIDER KNOW ABOUT:  Any allergies you have.  All medicines you are taking, including  vitamins, herbs, eye drops, creams, and over-the-counter medicines.  Previous problems you or members of your family have had with the use of anesthetics.  Any blood disorders you have.  Previous surgeries you have had.  Medical conditions you have. RISKS AND COMPLICATIONS  Generally, this is a safe procedure. However, as with any procedure, complications can occur. Possible complications include:  An infection developing in the area where the surgery was done.  Problems with the incisions, including:  Bleeding from an incision.  The wound reopening.  Tissues from inside the abdomen bulging through the incision (hernia).  Bleeding inside the abdomen.  Reopening of the colon where it was stitched or stapled together. This is a serious complication. Another procedure may be needed to fix the problem.  Damage to other organs in the abdomen.  A blood clot forming in a vein and traveling to the lungs.  Future blockage of the small intestine from scar tissue. Another surgery may be needed to repair this. BEFORE THE PROCEDURE  Various tests may be done before the procedure. These may include:  Blood tests.  A test to check the heart's rhythm (electrocardiography).  A CT scan to get pictures of your abdomen.  Ask your health care provider about changing or stopping any regular medicines. You will need to stop taking aspirin and nonsteroidal anti-inflammatory drugs (NSAIDs) at least 5 days before the surgery. You will also need to stop taking any  blood thinners and vitamin E.  You may be prescribed an oral bowel prep. This involves drinking a large amount of medicated liquid, starting the day before your surgery. The liquid will cause you to have multiple loose stools until your stool is almost clear or light green. This cleans out your colon in preparation for the surgery.  Do not eat or drink anything else once you have started the bowel prep, unless your health care provider  tells you it is safe to do so.  You may also be given antibiotic pills to clean out your colon of bacteria. Be sure to follow the directions carefully and take the medicine at the correct time.  Make plans to have someone drive you home after your hospital stay. Also arrange for someone to help you with activities during your recovery. PROCEDURE This surgery can take 2-4 hours.  Small monitors will be put on your body. They are used to check your heart, blood pressure, and oxygen level.  An IV access tube will be put into one of your veins. Medicine will be able to flow directly into your body through this IV tube.  You might be given a medicine to help you relax (sedative).  You will be given a medicine to make you sleep through the procedure (general anesthetic). A breathing tube may be placed into your lungs during the procedure.  A thin, flexible tube (catheter) will be placed into your bladder to collect urine.  A tube may be put in through your nose. It is called a nasogastric tube. It is used to remove stomach fluids after surgery until the intestines start working again.  Once you are asleep, the surgeon will make an incision in the abdomen.  Clamps or staples are put on both ends of the diseased part of the colon.  The part of the intestine between the clamps or staples is removed.  If possible, the ends of the healthy colon that remain will be stitched or stapled together to allow your body to expel waste (stool).  Sometimes, the remaining colon cannot be stitched back together. If this is the case, a colostomy is needed. For a colostomy:  An opening (stoma) to the outside of your body is made through the abdomen.  The end of the colon is brought to the opening. It is stitched to the skin.  A bag is attached to the opening. Stool will drain into this bag. The bag is removable.  The colostomy can be temporary or permanent.  The incision from the colectomy will be  closed with stitches or staples. AFTER THE PROCEDURE  You will stay in a recovery area until the anesthetic has worn off. Your blood pressure and pulse will be checked often. Then you will be taken to a hospital room.  You will continue to get fluids through the IV tube for a while. The IV tube will be taken out when the colon starts working again.  You will start on clear liquids and gradually go back to a normal diet.  You will be encouraged to cough and to take deep breaths to open your lungs and prevent pneumonia.  Some pain is normal after a colectomy. You will be given pain medicine as needed.  You will be urged to get up and start walking within a day.  If you had a colostomy, your health care provider will explain how it works and what you will need to do.  You will likely need to stay  in the hospital for 3-7 days.   This information is not intended to replace advice given to you by your health care provider. Make sure you discuss any questions you have with your health care provider.   Document Released: 04/01/2009 Document Revised: 03/25/2013 Document Reviewed: 01/14/2013 Elsevier Interactive Patient Education 2016 [place]. Open Colectomy, Care After Refer to this sheet in the next few weeks. These instructions provide you with information on caring for yourself after your procedure. Your health care provider may also give you more specific instructions. Your treatment has been planned according to current medical practices, but problems sometimes occur. Call your health care provider if you have any problems or questions after your procedure. WHAT TO EXPECT AFTER THE PROCEDURE After your procedure, it is typical to have the following:  Pain in your abdomen, especially along your incision. You will be given medicines to control the pain.  Tiredness. This is a normal part of the recovery process. Your energy level will return to normal over the next several  weeks.  Constipation. You may be given a stool softener to prevent this. HOME CARE INSTRUCTIONS  Only take over-the-counter or prescription medicines as directed by your health care provider.  Ask your health care provider whether you may take a shower when you go home.  You may resume a normal diet and activities as directed. Eat plenty of fruits and vegetables to help prevent constipation.  Drink enough fluids to keep your urine clear or pale yellow. This also helps prevent constipation.  Take rest breaks during the day as needed.  Avoid lifting anything heavier than 25 pounds (11.3 kg) or driving for 4 weeks or until your health care provider says it is okay.  Follow up with your health care provider as directed. Ask your health care provider when you need to return to have your stitches or staples removed. SEEK MEDICAL CARE IF:  You have redness, swelling, or increasing pain in the incision area.  You see pus coming from the incision area.  You have a fever. SEEK IMMEDIATE MEDICAL CARE IF:   You have chest pain or shortness of breath.  You have pain or swelling in your legs.  You have persistent nausea and vomiting.  Your wound breaks open after stitches or staples have been removed.  You have increasing abdominal pain that is not relieved with medicine.   This information is not intended to replace advice given to you by your health care provider. Make sure you discuss any questions you have with your health care provider.   Document Released: 12/26/2010 Document Revised: 03/25/2013 Document Reviewed: 01/14/2013 Elsevier Interactive Patient Education 2016 [employer] PATIENT INSTRUCTIONS POST-ANESTHESIA  IMMEDIATELY FOLLOWING SURGERY:  Do not drive or operate machinery for the first twenty four hours after surgery.  Do not make any important decisions for twenty four hours after surgery or while taking narcotic pain medications or sedatives.  If you develop  intractable nausea and vomiting or a severe headache please notify your doctor immediately.  FOLLOW-UP:  Please make an appointment with your surgeon as instructed. You do not need to follow up with anesthesia unless specifically instructed to do so.  WOUND CARE INSTRUCTIONS (if applicable):  Keep a dry clean dressing on the anesthesia/puncture wound site if there is drainage.  Once the wound has quit draining you may leave it open to air.  Generally you should leave the bandage intact for twenty four hours unless there is drainage.  If the epidural  site drains for more than 36-48 hours please call the anesthesia department.  QUESTIONS?:  Please feel free to call your physician or the hospital operator if you have any questions, and they will be happy to assist you.

## 2015-09-27 ENCOUNTER — Other Ambulatory Visit: Payer: Self-pay

## 2015-09-27 ENCOUNTER — Encounter (HOSPITAL_COMMUNITY): Payer: Self-pay

## 2015-09-27 ENCOUNTER — Encounter (HOSPITAL_COMMUNITY)
Admission: RE | Admit: 2015-09-27 | Discharge: 2015-09-27 | Disposition: A | Discharge status: Home / Self Care | Payer: BLUE CROSS/BLUE SHIELD | Source: Ambulatory Visit | Attending: General Surgery | Admitting: General Surgery

## 2015-09-27 DIAGNOSIS — Z01810 Encounter for preprocedural cardiovascular examination: Secondary | ICD-10-CM | POA: Insufficient documentation

## 2015-09-27 DIAGNOSIS — Z01812 Encounter for preprocedural laboratory examination: Secondary | ICD-10-CM | POA: Insufficient documentation

## 2015-09-27 HISTORY — DX: Unspecified osteoarthritis, unspecified site: M19.90

## 2015-09-27 LAB — CBC WITH DIFFERENTIAL/PLATELET
BASOS ABS: 0 10*3/uL (ref 0.0–0.1)
BASOS PCT: 0 %
EOS ABS: 0.3 10*3/uL (ref 0.0–0.7)
Eosinophils Relative: 3 %
HEMATOCRIT: 42.9 % (ref 39.0–52.0)
HEMOGLOBIN: 14.2 g/dL (ref 13.0–17.0)
Lymphocytes Relative: 18 %
Lymphs Abs: 1.7 10*3/uL (ref 0.7–4.0)
MCH: 26.2 pg (ref 26.0–34.0)
MCHC: 33.1 g/dL (ref 30.0–36.0)
MCV: 79.2 fL (ref 78.0–100.0)
Monocytes Absolute: 0.8 10*3/uL (ref 0.1–1.0)
Monocytes Relative: 9 %
NEUTROS ABS: 6.6 10*3/uL (ref 1.7–7.7)
NEUTROS PCT: 70 %
Platelets: 424 10*3/uL — ABNORMAL HIGH (ref 150–400)
RBC: 5.42 MIL/uL (ref 4.22–5.81)
RDW: 13.9 % (ref 11.5–15.5)
WBC: 9.4 10*3/uL (ref 4.0–10.5)

## 2015-09-27 LAB — COMPREHENSIVE METABOLIC PANEL
ALBUMIN: 3.5 g/dL (ref 3.5–5.0)
ALK PHOS: 66 U/L (ref 38–126)
ALT: 14 U/L — ABNORMAL LOW (ref 17–63)
AST: 19 U/L (ref 15–41)
Anion gap: 11 (ref 5–15)
BILIRUBIN TOTAL: 0.4 mg/dL (ref 0.3–1.2)
BUN: 12 mg/dL (ref 6–20)
CO2: 24 mmol/L (ref 22–32)
Calcium: 9 mg/dL (ref 8.9–10.3)
Chloride: 101 mmol/L (ref 101–111)
Creatinine, Ser: 1.23 mg/dL (ref 0.61–1.24)
GFR calc Af Amer: >60 mL/min (ref >60)
GFR calc non Af Amer: >60 mL/min (ref >60)
GLUCOSE: 144 mg/dL — AB (ref 65–99)
POTASSIUM: 4.6 mmol/L (ref 3.5–5.1)
SODIUM: 136 mmol/L (ref 135–145)
TOTAL PROTEIN: 6.8 g/dL (ref 6.5–8.1)

## 2015-09-27 LAB — ABO/RH: ABO/RH(D): A NEG

## 2015-09-27 LAB — PREPARE RBC (CROSSMATCH)

## 2015-09-27 NOTE — Pre-Procedure Instructions (Signed)
Patient given information to sign up for my chart at home. 

## 2015-09-28 LAB — CEA: CEA: 2.5 ng/mL (ref 0.0–4.7)

## 2015-10-03 ENCOUNTER — Inpatient Hospital Stay (HOSPITAL_COMMUNITY): Payer: BLUE CROSS/BLUE SHIELD | Admitting: Anesthesiology

## 2015-10-03 ENCOUNTER — Inpatient Hospital Stay (HOSPITAL_COMMUNITY)
Admission: RE | Admit: 2015-10-03 | Discharge: 2015-10-07 | DRG: 330 | Disposition: A | Discharge status: Home / Self Care | Payer: BLUE CROSS/BLUE SHIELD | Source: Ambulatory Visit | Attending: General Surgery | Admitting: General Surgery

## 2015-10-03 ENCOUNTER — Encounter (HOSPITAL_COMMUNITY)
Admission: RE | Disposition: A | Discharge status: Home / Self Care | Payer: Self-pay | Source: Ambulatory Visit | Attending: General Surgery

## 2015-10-03 ENCOUNTER — Encounter (HOSPITAL_COMMUNITY): Payer: Self-pay | Admitting: *Deleted

## 2015-10-03 DIAGNOSIS — Z9049 Acquired absence of other specified parts of digestive tract: Secondary | ICD-10-CM

## 2015-10-03 DIAGNOSIS — K921 Melena: Secondary | ICD-10-CM | POA: Diagnosis present

## 2015-10-03 DIAGNOSIS — K5732 Diverticulitis of large intestine without perforation or abscess without bleeding: Secondary | ICD-10-CM | POA: Diagnosis present

## 2015-10-03 DIAGNOSIS — Z87891 Personal history of nicotine dependence: Secondary | ICD-10-CM

## 2015-10-03 DIAGNOSIS — C187 Malignant neoplasm of sigmoid colon: Secondary | ICD-10-CM | POA: Diagnosis present

## 2015-10-03 DIAGNOSIS — I10 Essential (primary) hypertension: Secondary | ICD-10-CM | POA: Diagnosis present

## 2015-10-03 DIAGNOSIS — Z833 Family history of diabetes mellitus: Secondary | ICD-10-CM | POA: Diagnosis not present

## 2015-10-03 HISTORY — PX: PARTIAL COLECTOMY: SHX5273

## 2015-10-03 SURGERY — COLECTOMY, PARTIAL
Anesthesia: General | Site: Abdomen

## 2015-10-03 MED ORDER — KETOROLAC TROMETHAMINE 30 MG/ML IJ SOLN
30.0000 mg | Freq: Once | INTRAMUSCULAR | Status: AC
  Administered 2015-10-03: 30 mg via INTRAVENOUS

## 2015-10-03 MED ORDER — ACETAMINOPHEN 325 MG PO TABS: 650.0000 mg | ORAL_TABLET | Freq: Four times a day (QID) | ORAL | Status: DC | PRN

## 2015-10-03 MED ORDER — POVIDONE-IODINE 10 % EX OINT
TOPICAL_OINTMENT | CUTANEOUS | Status: AC
  Filled 2015-10-03: qty 1

## 2015-10-03 MED ORDER — CIPROFLOXACIN IN D5W 400 MG/200ML IV SOLN
400.0000 mg | INTRAVENOUS | Status: AC
  Administered 2015-10-03: 400 mg via INTRAVENOUS
  Filled 2015-10-03: qty 200

## 2015-10-03 MED ORDER — SUFENTANIL CITRATE 50 MCG/ML IV SOLN
INTRAVENOUS | Status: DC | PRN
  Administered 2015-10-03 (×3): 10 ug via INTRAVENOUS
  Administered 2015-10-03: 5 ug via INTRAVENOUS

## 2015-10-03 MED ORDER — OXYCODONE-ACETAMINOPHEN 5-325 MG PO TABS
1.0000 | ORAL_TABLET | ORAL | Status: DC | PRN
  Administered 2015-10-03 – 2015-10-04 (×3): 1 via ORAL
  Administered 2015-10-05: 2 via ORAL
  Filled 2015-10-03 (×2): qty 1
  Filled 2015-10-03: qty 2
  Filled 2015-10-03: qty 1

## 2015-10-03 MED ORDER — ONDANSETRON 4 MG PO TBDP
4.0000 mg | ORAL_TABLET | Freq: Four times a day (QID) | ORAL | Status: DC | PRN
  Administered 2015-10-03 – 2015-10-05 (×2): 4 mg via ORAL
  Filled 2015-10-03 (×2): qty 1

## 2015-10-03 MED ORDER — FENTANYL CITRATE (PF) 250 MCG/5ML IJ SOLN
INTRAMUSCULAR | Status: AC
  Filled 2015-10-03: qty 5

## 2015-10-03 MED ORDER — ROCURONIUM BROMIDE 50 MG/5ML IV SOLN
INTRAVENOUS | Status: AC
  Filled 2015-10-03: qty 1

## 2015-10-03 MED ORDER — ROCURONIUM BROMIDE 100 MG/10ML IV SOLN
INTRAVENOUS | Status: DC | PRN
  Administered 2015-10-03: 10 mg via INTRAVENOUS
  Administered 2015-10-03: 35 mg via INTRAVENOUS
  Administered 2015-10-03: 5 mg via INTRAVENOUS
  Administered 2015-10-03 (×2): 10 mg via INTRAVENOUS

## 2015-10-03 MED ORDER — LACTATED RINGERS IV SOLN
INTRAVENOUS | Status: DC
  Administered 2015-10-03 (×2): via INTRAVENOUS
  Administered 2015-10-04: 100 mL/h via INTRAVENOUS
  Administered 2015-10-04 – 2015-10-06 (×4): via INTRAVENOUS

## 2015-10-03 MED ORDER — MIDAZOLAM HCL 2 MG/2ML IJ SOLN
INTRAMUSCULAR | Status: DC | PRN
  Administered 2015-10-03 (×2): 1 mg via INTRAVENOUS

## 2015-10-03 MED ORDER — BUPIVACAINE LIPOSOME 1.3 % IJ SUSP
INTRAMUSCULAR | Status: AC
  Filled 2015-10-03: qty 20

## 2015-10-03 MED ORDER — ACETAMINOPHEN 650 MG RE SUPP: 650.0000 mg | Freq: Four times a day (QID) | RECTAL | Status: DC | PRN

## 2015-10-03 MED ORDER — DIPHENHYDRAMINE HCL 25 MG PO CAPS: 25.0000 mg | ORAL_CAPSULE | Freq: Four times a day (QID) | ORAL | Status: DC | PRN

## 2015-10-03 MED ORDER — SIMETHICONE 80 MG PO CHEW: 40.0000 mg | CHEWABLE_TABLET | Freq: Four times a day (QID) | ORAL | Status: DC | PRN

## 2015-10-03 MED ORDER — ROCURONIUM BROMIDE 50 MG/5ML IV SOLN
INTRAVENOUS | Status: AC
Start: 2015-10-03 — End: 2015-10-03
  Filled 2015-10-03: qty 1

## 2015-10-03 MED ORDER — KETOROLAC TROMETHAMINE 30 MG/ML IJ SOLN
INTRAMUSCULAR | Status: AC
  Filled 2015-10-03: qty 1

## 2015-10-03 MED ORDER — FENTANYL CITRATE (PF) 100 MCG/2ML IJ SOLN
INTRAMUSCULAR | Status: DC | PRN
  Administered 2015-10-03 (×5): 50 ug via INTRAVENOUS

## 2015-10-03 MED ORDER — BUPIVACAINE LIPOSOME 1.3 % IJ SUSP
INTRAMUSCULAR | Status: DC | PRN
  Administered 2015-10-03: 20 mL

## 2015-10-03 MED ORDER — ONDANSETRON HCL 4 MG/2ML IJ SOLN: 4.0000 mg | Freq: Once | INTRAMUSCULAR | Status: DC | PRN

## 2015-10-03 MED ORDER — LACTATED RINGERS IV SOLN
INTRAVENOUS | Status: DC
  Administered 2015-10-03 (×4): via INTRAVENOUS

## 2015-10-03 MED ORDER — GLYCOPYRROLATE 0.2 MG/ML IJ SOLN
INTRAMUSCULAR | Status: DC | PRN
  Administered 2015-10-03: 0.4 mg via INTRAVENOUS

## 2015-10-03 MED ORDER — LIDOCAINE HCL (CARDIAC) 10 MG/ML IV SOLN
INTRAVENOUS | Status: DC | PRN
  Administered 2015-10-03: 50 mg via INTRAVENOUS

## 2015-10-03 MED ORDER — MIDAZOLAM HCL 2 MG/2ML IJ SOLN
INTRAMUSCULAR | Status: AC
  Filled 2015-10-03: qty 2

## 2015-10-03 MED ORDER — LIDOCAINE HCL (PF) 1 % IJ SOLN
INTRAMUSCULAR | Status: AC
  Filled 2015-10-03: qty 5

## 2015-10-03 MED ORDER — ONDANSETRON HCL 4 MG/2ML IJ SOLN
4.0000 mg | Freq: Four times a day (QID) | INTRAMUSCULAR | Status: DC | PRN
  Administered 2015-10-04 – 2015-10-07 (×6): 4 mg via INTRAVENOUS
  Filled 2015-10-03 (×6): qty 2

## 2015-10-03 MED ORDER — SUFENTANIL CITRATE 50 MCG/ML IV SOLN
INTRAVENOUS | Status: AC
  Filled 2015-10-03: qty 1

## 2015-10-03 MED ORDER — HYDROMORPHONE HCL 1 MG/ML IJ SOLN
1.0000 mg | INTRAMUSCULAR | Status: DC | PRN
Start: 2015-10-03 — End: 2015-10-07
  Administered 2015-10-03 – 2015-10-07 (×15): 1 mg via INTRAVENOUS
  Filled 2015-10-03 (×15): qty 1

## 2015-10-03 MED ORDER — METRONIDAZOLE IN NACL 5-0.79 MG/ML-% IV SOLN
500.0000 mg | INTRAVENOUS | Status: AC
  Administered 2015-10-03: 500 mg via INTRAVENOUS
  Filled 2015-10-03: qty 100

## 2015-10-03 MED ORDER — PROPOFOL 10 MG/ML IV BOLUS
INTRAVENOUS | Status: DC | PRN
  Administered 2015-10-03: 50 mg via INTRAVENOUS
  Administered 2015-10-03: 30 mg via INTRAVENOUS
  Administered 2015-10-03: 170 mg via INTRAVENOUS

## 2015-10-03 MED ORDER — DIPHENHYDRAMINE HCL 50 MG/ML IJ SOLN: 25.0000 mg | Freq: Four times a day (QID) | INTRAMUSCULAR | Status: DC | PRN

## 2015-10-03 MED ORDER — CHLORHEXIDINE GLUCONATE 4 % EX LIQD: 1.0000 "application " | Freq: Once | CUTANEOUS | Status: DC

## 2015-10-03 MED ORDER — LISINOPRIL 10 MG PO TABS
10.0000 mg | ORAL_TABLET | Freq: Every day | ORAL | Status: DC
  Administered 2015-10-04 – 2015-10-07 (×4): 10 mg via ORAL
  Filled 2015-10-03 (×4): qty 1

## 2015-10-03 MED ORDER — GLYCOPYRROLATE 0.2 MG/ML IJ SOLN
INTRAMUSCULAR | Status: AC
  Filled 2015-10-03: qty 2

## 2015-10-03 MED ORDER — ONDANSETRON HCL 4 MG/2ML IJ SOLN
4.0000 mg | Freq: Once | INTRAMUSCULAR | Status: AC
  Administered 2015-10-03: 4 mg via INTRAVENOUS

## 2015-10-03 MED ORDER — PROPOFOL 10 MG/ML IV BOLUS
INTRAVENOUS | Status: AC
  Filled 2015-10-03: qty 40

## 2015-10-03 MED ORDER — SODIUM CHLORIDE 0.9 % IJ SOLN
INTRAMUSCULAR | Status: AC
  Filled 2015-10-03: qty 10

## 2015-10-03 MED ORDER — ALVIMOPAN 12 MG PO CAPS
12.0000 mg | ORAL_CAPSULE | Freq: Once | ORAL | Status: AC
  Administered 2015-10-03: 12 mg via ORAL

## 2015-10-03 MED ORDER — POVIDONE-IODINE 10 % OINT PACKET
TOPICAL_OINTMENT | CUTANEOUS | Status: DC | PRN
  Administered 2015-10-03: 1 via TOPICAL

## 2015-10-03 MED ORDER — ARTIFICIAL TEARS OP OINT
TOPICAL_OINTMENT | OPHTHALMIC | Status: AC
Start: 2015-10-03 — End: 2015-10-03
  Filled 2015-10-03: qty 3.5

## 2015-10-03 MED ORDER — HEMOSTATIC AGENTS (NO CHARGE) OPTIME
TOPICAL | Status: DC | PRN
  Administered 2015-10-03: 1 via TOPICAL

## 2015-10-03 MED ORDER — ENOXAPARIN SODIUM 40 MG/0.4ML ~~LOC~~ SOLN
40.0000 mg | SUBCUTANEOUS | Status: DC
  Administered 2015-10-04 – 2015-10-07 (×4): 40 mg via SUBCUTANEOUS
  Filled 2015-10-03 (×4): qty 0.4

## 2015-10-03 MED ORDER — ALVIMOPAN 12 MG PO CAPS
12.0000 mg | ORAL_CAPSULE | Freq: Two times a day (BID) | ORAL | Status: DC
  Administered 2015-10-04 – 2015-10-07 (×7): 12 mg via ORAL
  Filled 2015-10-03 (×7): qty 1

## 2015-10-03 MED ORDER — ALVIMOPAN 12 MG PO CAPS
ORAL_CAPSULE | ORAL | Status: AC
  Filled 2015-10-03: qty 1

## 2015-10-03 MED ORDER — ONDANSETRON HCL 4 MG/2ML IJ SOLN
INTRAMUSCULAR | Status: AC
  Filled 2015-10-03: qty 2

## 2015-10-03 MED ORDER — ENOXAPARIN SODIUM 40 MG/0.4ML ~~LOC~~ SOLN
40.0000 mg | Freq: Once | SUBCUTANEOUS | Status: AC
  Administered 2015-10-03: 40 mg via SUBCUTANEOUS
  Filled 2015-10-03: qty 0.4

## 2015-10-03 MED ORDER — MIDAZOLAM HCL 2 MG/2ML IJ SOLN
1.0000 mg | INTRAMUSCULAR | Status: DC | PRN
  Administered 2015-10-03: 2 mg via INTRAVENOUS
  Filled 2015-10-03: qty 2

## 2015-10-03 MED ORDER — NEOSTIGMINE METHYLSULFATE 10 MG/10ML IV SOLN
INTRAVENOUS | Status: AC
  Filled 2015-10-03: qty 1

## 2015-10-03 MED ORDER — BOOST / RESOURCE BREEZE PO LIQD
1.0000 | Freq: Three times a day (TID) | ORAL | Status: DC
  Administered 2015-10-03 – 2015-10-04 (×2): 1 via ORAL

## 2015-10-03 MED ORDER — NEOSTIGMINE METHYLSULFATE 10 MG/10ML IV SOLN
INTRAVENOUS | Status: DC | PRN
  Administered 2015-10-03: 3 mg via INTRAVENOUS

## 2015-10-03 MED ORDER — LORAZEPAM 2 MG/ML IJ SOLN: 1.0000 mg | INTRAMUSCULAR | Status: DC | PRN

## 2015-10-03 MED ORDER — FENTANYL CITRATE (PF) 100 MCG/2ML IJ SOLN: 25.0000 ug | INTRAMUSCULAR | Status: DC | PRN

## 2015-10-03 MED ORDER — 0.9 % SODIUM CHLORIDE (POUR BTL) OPTIME
TOPICAL | Status: DC | PRN
  Administered 2015-10-03 (×2): 1000 mL

## 2015-10-03 SURGICAL SUPPLY — 72 items
APPLIER CLIP 11 MED OPEN (CLIP)
APPLIER CLIP 13 LRG OPEN (CLIP)
BAG HAMPER (MISCELLANEOUS) ×3 IMPLANT
BARRIER SKIN 2 3/4 (OSTOMY) IMPLANT
BARRIER SKIN 2 3/4 INCH (OSTOMY)
CELLS DAT CNTRL 66122 CELL SVR (MISCELLANEOUS) ×1 IMPLANT
CHLORAPREP W/TINT 26ML (MISCELLANEOUS) ×3 IMPLANT
CLAMP POUCH DRAINAGE QUIET (OSTOMY) IMPLANT
CLIP APPLIE 11 MED OPEN (CLIP) IMPLANT
CLIP APPLIE 13 LRG OPEN (CLIP) IMPLANT
CLOTH BEACON ORANGE TIMEOUT ST (SAFETY) ×3 IMPLANT
COVER LIGHT HANDLE STERIS (MISCELLANEOUS) ×12 IMPLANT
COVER MAYO STAND XLG (DRAPE) ×3 IMPLANT
DRAPE UTILITY W/TAPE 26X15 (DRAPES) ×6 IMPLANT
DRAPE WARM FLUID 44X44 (DRAPE) ×3 IMPLANT
DRSG OPSITE POSTOP 4X10 (GAUZE/BANDAGES/DRESSINGS) IMPLANT
DRSG OPSITE POSTOP 4X8 (GAUZE/BANDAGES/DRESSINGS) ×3 IMPLANT
ELECT BLADE 6 FLAT ULTRCLN (ELECTRODE) IMPLANT
ELECT REM PT RETURN 9FT ADLT (ELECTROSURGICAL) ×3
ELECTRODE REM PT RTRN 9FT ADLT (ELECTROSURGICAL) ×1 IMPLANT
FORMALIN 10 PREFIL 480ML (MISCELLANEOUS) IMPLANT
GLOVE BIOGEL PI IND STRL 7.0 (GLOVE) ×3 IMPLANT
GLOVE BIOGEL PI INDICATOR 7.0 (GLOVE) ×6
GLOVE ECLIPSE 6.5 STRL STRAW (GLOVE) ×9 IMPLANT
GLOVE EXAM NITRILE MD LF STRL (GLOVE) ×3 IMPLANT
GLOVE SURG SS PI 7.5 STRL IVOR (GLOVE) ×9 IMPLANT
GOWN STRL REUS W/ TWL XL LVL3 (GOWN DISPOSABLE) ×2 IMPLANT
GOWN STRL REUS W/TWL LRG LVL3 (GOWN DISPOSABLE) ×12 IMPLANT
GOWN STRL REUS W/TWL XL LVL3 (GOWN DISPOSABLE) ×4
HANDLE SUCTION POOLE (INSTRUMENTS) ×1 IMPLANT
HEMOSTAT SNOW SURGICEL 2X4 (HEMOSTASIS) ×3 IMPLANT
INST SET MAJOR GENERAL (KITS) ×3 IMPLANT
KIT BLADEGUARD II DBL (SET/KITS/TRAYS/PACK) ×3 IMPLANT
KIT ROOM TURNOVER APOR (KITS) ×3 IMPLANT
LIGASURE IMPACT 36 18CM CVD LR (INSTRUMENTS) ×3 IMPLANT
MANIFOLD NEPTUNE II (INSTRUMENTS) ×3 IMPLANT
NEEDLE HYPO 18GX1.5 BLUNT FILL (NEEDLE) ×3 IMPLANT
NEEDLE HYPO 21X1.5 SAFETY (NEEDLE) ×3 IMPLANT
NS IRRIG 1000ML POUR BTL (IV SOLUTION) ×6 IMPLANT
PACK ABDOMINAL MAJOR (CUSTOM PROCEDURE TRAY) ×3 IMPLANT
PAD ARMBOARD 7.5X6 YLW CONV (MISCELLANEOUS) ×3 IMPLANT
PENCIL HANDSWITCHING (ELECTRODE) ×3 IMPLANT
POUCH OSTOMY 2 3/4  H 3804 (WOUND CARE)
POUCH OSTOMY 2 PC DRNBL 2.25 (WOUND CARE) IMPLANT
POUCH OSTOMY 2 PC DRNBL 2.75 (WOUND CARE) IMPLANT
POUCH OSTOMY DRNBL 2 1/4 (WOUND CARE)
RELOAD LINEAR CUT PROX 55 BLUE (ENDOMECHANICALS) IMPLANT
RELOAD PROXIMATE 75MM BLUE (ENDOMECHANICALS) ×6 IMPLANT
RETRACTOR WND ALEXIS 25 LRG (MISCELLANEOUS) ×1 IMPLANT
RTRCTR WOUND ALEXIS 18CM MED (MISCELLANEOUS) ×3
RTRCTR WOUND ALEXIS 25CM LRG (MISCELLANEOUS) ×3
SET BASIN LINEN APH (SET/KITS/TRAYS/PACK) ×3 IMPLANT
SPONGE LAP 18X18 X RAY DECT (DISPOSABLE) ×6 IMPLANT
STAPLER GUN LINEAR PROX 60 (STAPLE) ×3 IMPLANT
STAPLER PROXIMATE 55 BLUE (STAPLE) IMPLANT
STAPLER PROXIMATE 75MM BLUE (STAPLE) ×3 IMPLANT
STAPLER VISISTAT (STAPLE) ×3 IMPLANT
SUCTION POOLE HANDLE (INSTRUMENTS) ×3
SUCTION YANKAUER HANDLE (MISCELLANEOUS) ×3 IMPLANT
SUT CHROMIC 0 SH (SUTURE) IMPLANT
SUT CHROMIC 2 0 SH (SUTURE) IMPLANT
SUT CHROMIC 3 0 SH 27 (SUTURE) IMPLANT
SUT NOVA NAB GS-26 0 60 (SUTURE) IMPLANT
SUT PDS AB 0 CTX 60 (SUTURE) ×6 IMPLANT
SUT SILK 2 0 (SUTURE)
SUT SILK 2 0 REEL (SUTURE) IMPLANT
SUT SILK 2-0 18XBRD TIE 12 (SUTURE) IMPLANT
SUT SILK 3 0 SH CR/8 (SUTURE) ×6 IMPLANT
SYR 20CC LL (SYRINGE) ×3 IMPLANT
TOWEL BLUE STERILE X RAY DET (MISCELLANEOUS) IMPLANT
TOWEL OR 17X26 4PK STRL BLUE (TOWEL DISPOSABLE) ×3 IMPLANT
TRAY FOLEY CATH SILVER 16FR (SET/KITS/TRAYS/PACK) ×3 IMPLANT

## 2015-10-03 NOTE — Anesthesia Postprocedure Evaluation (Signed)
Anesthesia Post Note  Patient: Derek Rodriguez  Procedure(s) Performed: Procedure(s) (LRB): PARTIAL COLECTOMY (N/A)  Patient location during evaluation: PACU Anesthesia Type: General Level of consciousness: awake and patient uncooperative Pain management: satisfactory to patient Vital Signs Assessment: post-procedure vital signs reviewed and stable Respiratory status: spontaneous breathing and non-rebreather facemask Cardiovascular status: blood pressure returned to baseline Anesthetic complications: no    Last Vitals:  Filed Vitals:   10/03/15 1145 10/03/15 1146  BP: 103/72 103/72  Pulse: 71 73  Temp:    Resp: 12 11    Last Pain: There were no vitals filed for this visit.               Drucie Opitz

## 2015-10-03 NOTE — Interval H&P Note (Signed)
History and Physical Interval Note:  10/03/2015 8:55 AM  Derek Rodriguez  has presented today for surgery, with the diagnosis of diverticulitis  The various methods of treatment have been discussed with the patient and family. After consideration of risks, benefits and other options for treatment, the patient has consented to  Procedure(s): PARTIAL COLECTOMY (N/A) as a surgical intervention .  The patient's history has been reviewed, patient examined, no change in status, stable for surgery.  I have reviewed the patient's chart and labs.  Questions were answered to the patient's satisfaction.     Aviva Signs A

## 2015-10-03 NOTE — Op Note (Signed)
Patient:  Derek Rodriguez  DOB:  06-11-1961  MRN:  [id number]   Preop Diagnosis:  Sigmoid diverticulitis  Postop Diagnosis:  Same  Procedure:  Partial colectomy, splenic flexure takedown  Surgeon:  Aviva Signs, M.D.  Anes:  Gen. endotracheal  Indications:  Patient is a 55 year old white male who presents with recurrent episodes of sigmoid diverticulitis. The risks and benefits of the procedure including bleeding, infection, cardiopulmonary difficulties, the possibility of a malignancy, and the possibility of a blood transfusion were fully explained to the patient, who gave informed consent.  Procedure note:  The patient was placed the supine position. After induction of general endotracheal anesthesia, the abdomen was prepped and draped using usual sterile technique with ChloraPrep. Surgical site confirmation was performed.  A midline incision was made just above the umbilicus to the suprapubic region. The peritoneal cavity was entered into without difficulty. The liver was inspected and no palpable lesions were noted. The ascending and transverse colon regions were palpated and no mass lesions were noted. The was some stool in the proximal descending colon. Approximate 8-10 inch segment of the distal sigmoid colon was noted to be significantly inflamed and thickened. There was surrounding adipose tissue degradation present. This was attached to the dome of the bladder, but it easily freed away without difficulty. It did not appear to be infiltrative in nature. The sigmoid colon was mobilized to its peritoneal reflection. Care was taken to avoid the left ureter. The descending colon and splenic flexure were taken down using Bovie electrocautery. A GIA 75 stapler was placed across the proximal portion the sigmoid colon and fired. This was likewise done in the distal colon segment, were grossly normal tissue was found. The mesentery was then divided using the LigaSure. A suture was placed proximally  for orientation purposes. The specimen was sent to pathology further examination. A side to side coronary anastomosis was performed using a GIA-75 stapler. The colotomy was closed using a TA 60 stapler. The staple line was bolstered using 3-0 silk sutures. Omentum was then placed over this anastomosis and secured in place using a 3-0 silk suture. The abdominal cavity was then copiously irrigated with normal saline. Surgicel was placed over the raw area on the dome of the bladder. All operating personnel then changed gowns and gloves. A new setup was then used.  The fascia was reapproximated using a looped 0 PDS running suture. The subcutaneous layer was irrigated normal saline. Exparel was instilled into the surrounding wound. The skin was closed using staples. Betadine ointment and dry sterile dressings were applied.  All tape and needle counts were correct at the end of the procedure. The patient was extubated in the operating room and transferred to PACU in stable condition.  Complications:  None  EBL:  250 mL  Specimen:  Sigmoid colon, suture proximal

## 2015-10-03 NOTE — Anesthesia Preprocedure Evaluation (Signed)
Anesthesia Evaluation  Patient identified by MRN, date of birth, ID band Patient awake    Reviewed: Allergy & Precautions, NPO status , Patient's Chart, lab work & pertinent test results  Airway Mallampati: II  TM Distance: >3 FB     Dental  (+) Teeth Intact, Dental Advisory Given   Pulmonary former smoker,           Cardiovascular hypertension, Pt. on medications  Rhythm:Regular Rate:Normal     Neuro/Psych    GI/Hepatic negative GI ROS,   Endo/Other    Renal/GU      Musculoskeletal   Abdominal   Peds  Hematology   Anesthesia Other Findings   Reproductive/Obstetrics                             Anesthesia Physical Anesthesia Plan  ASA: II  Anesthesia Plan: General   Post-op Pain Management:    Induction: Intravenous  Airway Management Planned: Oral ETT  Additional Equipment:   Intra-op Plan:   Post-operative Plan: Extubation in OR  Informed Consent: I have reviewed the patients History and Physical, chart, labs and discussed the procedure including the risks, benefits and alternatives for the proposed anesthesia with the patient or authorized representative who has indicated his/her understanding and acceptance.     Plan Discussed with:   Anesthesia Plan Comments:         Anesthesia Quick Evaluation

## 2015-10-03 NOTE — Transfer of Care (Signed)
Immediate Anesthesia Transfer of Care Note  Patient: Derek Rodriguez  Procedure(s) Performed: Procedure(s): PARTIAL COLECTOMY (N/A)  Patient Location: PACU  Anesthesia Type:General  Level of Consciousness: awake and patient cooperative  Airway & Oxygen Therapy: Patient Spontanous Breathing and non-rebreather face mask  Post-op Assessment: Report given to RN, Post -op Vital signs reviewed and stable and Patient moving all extremities  Post vital signs: Reviewed and stable  Last Vitals:  Filed Vitals:   10/03/15 0845 10/03/15 0900  BP: 115/76 109/77  Pulse:    Temp:    Resp: 17 13    Complications: No apparent anesthesia complications

## 2015-10-03 NOTE — Anesthesia Procedure Notes (Signed)
Procedure Name: Intubation Date/Time: 10/03/2015 9:16 AM Performed by: Vista Deck Pre-anesthesia Checklist: Patient identified, Patient being monitored, Timeout performed, Emergency Drugs available and Suction available Patient Re-evaluated:Patient Re-evaluated prior to inductionOxygen Delivery Method: Circle System Utilized Preoxygenation: Pre-oxygenation with 100% oxygen Intubation Type: IV induction Ventilation: Mask ventilation without difficulty and Oral airway inserted - appropriate to patient size Laryngoscope Size: Sabra Heck and 2 Grade View: Grade I Tube type: Oral Tube size: 8.0 mm Number of attempts: 1 Airway Equipment and Method: Stylet Placement Confirmation: ETT inserted through vocal cords under direct vision,  positive ETCO2 and breath sounds checked- equal and bilateral Secured at: 21 cm Tube secured with: Tape Dental Injury: Teeth and Oropharynx as per pre-operative assessment

## 2015-10-04 ENCOUNTER — Encounter (HOSPITAL_COMMUNITY): Payer: Self-pay | Admitting: General Surgery

## 2015-10-04 LAB — BASIC METABOLIC PANEL
Anion gap: 10 (ref 5–15)
BUN: 14 mg/dL (ref 6–20)
CALCIUM: 8.1 mg/dL — AB (ref 8.9–10.3)
CHLORIDE: 100 mmol/L — AB (ref 101–111)
CO2: 23 mmol/L (ref 22–32)
CREATININE: 1.23 mg/dL (ref 0.61–1.24)
GFR calc non Af Amer: >60 mL/min (ref >60)
GLUCOSE: 93 mg/dL (ref 65–99)
Potassium: 4.4 mmol/L (ref 3.5–5.1)
Sodium: 133 mmol/L — ABNORMAL LOW (ref 135–145)

## 2015-10-04 LAB — CBC
HEMATOCRIT: 36.4 % — AB (ref 39.0–52.0)
HEMOGLOBIN: 12 g/dL — AB (ref 13.0–17.0)
MCH: 26.2 pg (ref 26.0–34.0)
MCHC: 33 g/dL (ref 30.0–36.0)
MCV: 79.5 fL (ref 78.0–100.0)
Platelets: 346 10*3/uL (ref 150–400)
RBC: 4.58 MIL/uL (ref 4.22–5.81)
RDW: 13.9 % (ref 11.5–15.5)
WBC: 8.8 10*3/uL (ref 4.0–10.5)

## 2015-10-04 LAB — MAGNESIUM: Magnesium: 1.6 mg/dL — ABNORMAL LOW (ref 1.7–2.4)

## 2015-10-04 LAB — PHOSPHORUS: PHOSPHORUS: 3.6 mg/dL (ref 2.5–4.6)

## 2015-10-04 MED ORDER — PRO-STAT SUGAR FREE PO LIQD: 30.0000 mL | Freq: Two times a day (BID) | ORAL | Status: DC

## 2015-10-04 MED ORDER — PRO-STAT SUGAR FREE PO LIQD
30.0000 mL | Freq: Two times a day (BID) | ORAL | Status: DC
  Administered 2015-10-04 – 2015-10-05 (×2): 30 mL via ORAL
  Filled 2015-10-04 (×3): qty 30

## 2015-10-04 MED ORDER — MAGNESIUM SULFATE 2 GM/50ML IV SOLN
2.0000 g | Freq: Once | INTRAVENOUS | Status: AC
  Administered 2015-10-04: 2 g via INTRAVENOUS
  Filled 2015-10-04: qty 50

## 2015-10-04 NOTE — Addendum Note (Signed)
Addendum  created 10/04/15 0801 by Mickel Baas, CRNA   Modules edited: Clinical Notes   Clinical Notes:  File: [id number]

## 2015-10-04 NOTE — Anesthesia Postprocedure Evaluation (Signed)
Anesthesia Post Note  Patient: Derek Rodriguez  Procedure(s) Performed: Procedure(s) (LRB): PARTIAL COLECTOMY (N/A)  Patient location during evaluation: Nursing Unit Anesthesia Type: General Level of consciousness: awake and alert and oriented Pain management: pain level controlled (Rates pain at a 5) Vital Signs Assessment: post-procedure vital signs reviewed and stable Respiratory status: spontaneous breathing and patient connected to nasal cannula oxygen Cardiovascular status: stable Postop Assessment: no signs of nausea or vomiting Anesthetic complications: no    Last Vitals:  Filed Vitals:   10/03/15 2241 10/04/15 0500  BP: 89/53 110/62  Pulse: 106 95  Temp: 37.1 C 37 C  Resp: 16 16    Last Pain:  Filed Vitals:   10/04/15 0539  PainSc: 5                  Oluwasemilore Pascuzzi A

## 2015-10-04 NOTE — Telephone Encounter (Signed)
Appt made for May.

## 2015-10-04 NOTE — Progress Notes (Signed)
1 Day Post-Op  Subjective: Moderate incisional pain. No bowel movement or flatus yet.  Objective: Vital signs in last 24 hours: Temp:  [97.8 F (36.6 C)-98.8 F (37.1 C)] 98.6 F (37 C) (04/18 0500) Pulse Rate:  [86-106] 95 (04/18 0500) Resp:  [16] 16 (04/18 0500) BP: (89-111)/(53-66) 110/62 mmHg (04/18 0500) SpO2:  [93 %-100 %] 97 % (04/18 0500) Last BM Date: 10/02/15  Intake/Output from previous day: 04/17 0701 - 04/18 0700 In: 4850 [P.O.:120; I.V.:4670] Out: [id number] [Urine:525; Blood:250] Intake/Output this shift:    General appearance: alert, cooperative and no distress Resp: clear to auscultation bilaterally Cardio: regular rate and rhythm, S1, S2 normal, no murmur, click, rub or gallop GI: ft, dressing dry and intact.  Lab Results:   Recent Labs  10/04/15 0428  WBC 8.8  HGB 12.0*  HCT 36.4*  PLT 346   BMET  Recent Labs  10/04/15 0428  NA 133*  K 4.4  CL 100*  CO2 23  GLUCOSE 93  BUN 14  CREATININE 1.23  CALCIUM 8.1*   PT/INR No results for input(s): LABPROT, INR in the last 72 hours.  Studies/Results: No results found.  Anti-infectives: Anti-infectives    Start     Dose/Rate Route Frequency Ordered Stop   10/03/15 0703  metroNIDAZOLE (FLAGYL) IVPB 500 mg     500 mg 100 mL/hr over 60 Minutes Intravenous On call to O.R. 10/03/15 0703 10/03/15 0915   10/03/15 0703  ciprofloxacin (CIPRO) IVPB 400 mg     400 mg 200 mL/hr over 60 Minutes Intravenous On call to O.R. 10/03/15 0703 10/03/15 0930      Assessment/Plan: s/p Procedure(s): PARTIAL COLECTOMY  impression: stable on postoperative day 1. mild hypomagnesemia noted. will be addressed. Will get patient up to chair. awaiting return of bowel function.  LOS: 1 day    Julias Mould,[place] A 10/04/2015

## 2015-10-04 NOTE — Progress Notes (Signed)
Initial Nutrition Assessment  DOCUMENTATION CODES:  Obesity unspecified  INTERVENTION:  Will order 30 mL Prostat BID, each supplement provides 100 kcal and 15 grams of protein.  Beverage requests ordered  NUTRITION DIAGNOSIS:  Increased nutrient needs related to wound healing as evidenced by estimated nutritional requirements for the condition  GOAL:  Patient will meet greater than or equal to 90% of their needs  MONITOR:  Diet advancement, Supplement acceptance, I & O's, Skin, Labs  REASON FOR ASSESSMENT:  Malnutrition Screening Tool    ASSESSMENT:  55 y/o male PMHx HTN and diverticulitis who had presented in march with recurrent diverticulitis of sigmoid. Pathology of the biopsied inflamed colonic tissue showed suspicious cells. Presented for partial colectomy. Pt now POD 1.   Pt had been seen by this RD <3 weeks ago and was diagnosed w/ severe malnutrition in acute illness (diverticulitis) at that time.   Pt in considerable pain at RD arrival. Wife at bedside states pt has yet to have any significant intake. He had a couple bites of jello, but this made him gag. He has had a couple sips of Boost Breeze. Pt reports main inhibition to trying to eat is lack of appetite. RD emphasized pt utilize his prn pain and nausea medication to help him initiate oral intake.   Wife asked about other foods/supplements available on CL diet. Wife asked to try the prostat. Also, the tea is the only beverage patient has been able to consume w/o side effects thus far. Will order 2x for dinner.   Given pt's current condition and desire to rest, did not seek information about how pt was doing prior to admit for surgery. However, wife did mention this is the 3rd day patient has gone w/o intake.   Labs reviewed: Depressed h/h. Mild hyponatremia/chloremia  Recent Labs Lab 10/04/15 0428  NA 133*  K 4.4  CL 100*  CO2 23  BUN 14  CREATININE 1.23  CALCIUM 8.1*  MG 1.6*  PHOS 3.6  GLUCOSE 93    Diet Order:  Diet clear liquid Room service appropriate?: Yes; Fluid consistency:: Thin  Skin: New surgical incision to abdomen  Last BM:  4/16  Height:  Ht Readings from Last 1 Encounters:  10/03/15 6' (1.829 m)   Weight:  Wt Readings from Last 1 Encounters:  10/03/15 235 lb (106.595 kg)   Wt Readings from Last 10 Encounters:  10/03/15 235 lb (106.595 kg)  09/27/15 235 lb (106.595 kg)  09/09/15 235 lb (106.595 kg)  08/22/15 250 lb (113.399 kg)   Ideal Body Weight:  80.91 kg  BMI:  Body mass index is 31.86 kg/(m^2).  Estimated Nutritional Needs:  Kcal:  2250-2450 (21-23 kcal/kg bw) Protein:  97-113 g (1.2-1.4 g/kg ibw) Fluid:  >2 liters  EDUCATION NEEDS:  No education needs identified at this time  Burtis Junes RD, LDN Clinical Nutrition Pager: [phone] 10/04/2015 3:57 PM

## 2015-10-05 LAB — BASIC METABOLIC PANEL
Anion gap: 7 (ref 5–15)
BUN: 10 mg/dL (ref 6–20)
CO2: 25 mmol/L (ref 22–32)
CREATININE: 0.92 mg/dL (ref 0.61–1.24)
Calcium: 8.3 mg/dL — ABNORMAL LOW (ref 8.9–10.3)
Chloride: 100 mmol/L — ABNORMAL LOW (ref 101–111)
GFR calc Af Amer: >60 mL/min (ref >60)
GLUCOSE: 91 mg/dL (ref 65–99)
POTASSIUM: 3.8 mmol/L (ref 3.5–5.1)
SODIUM: 132 mmol/L — AB (ref 135–145)

## 2015-10-05 LAB — CBC
HEMATOCRIT: 35.7 % — AB (ref 39.0–52.0)
Hemoglobin: 11.6 g/dL — ABNORMAL LOW (ref 13.0–17.0)
MCH: 25.8 pg — ABNORMAL LOW (ref 26.0–34.0)
MCHC: 32.5 g/dL (ref 30.0–36.0)
MCV: 79.3 fL (ref 78.0–100.0)
PLATELETS: 325 10*3/uL (ref 150–400)
RBC: 4.5 MIL/uL (ref 4.22–5.81)
RDW: 13.8 % (ref 11.5–15.5)
WBC: 9.5 10*3/uL (ref 4.0–10.5)

## 2015-10-05 LAB — PHOSPHORUS: PHOSPHORUS: 2.8 mg/dL (ref 2.5–4.6)

## 2015-10-05 LAB — MAGNESIUM: Magnesium: 1.8 mg/dL (ref 1.7–2.4)

## 2015-10-05 NOTE — Progress Notes (Signed)
2 Days Post-Op  Subjective: Still having significant incisional pain. He states he is passing gas. No bowel movement yet.  Objective: Vital signs in last 24 hours: Temp:  [98.1 F (36.7 C)-98.5 F (36.9 C)] 98.1 F (36.7 C) (04/19 0522) Pulse Rate:  [96-105] 96 (04/19 0522) Resp:  [16] 16 (04/19 0522) BP: (100-121)/(67-79) 121/79 mmHg (04/19 0522) SpO2:  [94 %-96 %] 96 % (04/19 0522) Last BM Date: 10/02/15  Intake/Output from previous day: 04/18 0701 - 04/19 0700 In: 920 [P.O.:120; I.V.:800] Out: 2250 [Urine:2250] Intake/Output this shift:    General appearance: alert, cooperative and no distress Resp: clear to auscultation bilaterally Cardio: regular rate and rhythm, S1, S2 normal, no murmur, click, rub or gallop GI: Soft, occasional bowel sounds appreciated. Incision healing well.  Lab Results:   Recent Labs  10/04/15 0428 10/05/15 0545  WBC 8.8 9.5  HGB 12.0* 11.6*  HCT 36.4* 35.7*  PLT 346 325   BMET  Recent Labs  10/04/15 0428 10/05/15 0545  NA 133* 132*  K 4.4 3.8  CL 100* 100*  CO2 23 25  GLUCOSE 93 91  BUN 14 10  CREATININE 1.23 0.92  CALCIUM 8.1* 8.3*   PT/INR No results for input(s): LABPROT, INR in the last 72 hours.  Studies/Results: No results found.  Anti-infectives: Anti-infectives    Start     Dose/Rate Route Frequency Ordered Stop   10/03/15 0703  metroNIDAZOLE (FLAGYL) IVPB 500 mg     500 mg 100 mL/hr over 60 Minutes Intravenous On call to O.R. 10/03/15 0703 10/03/15 0915   10/03/15 0703  ciprofloxacin (CIPRO) IVPB 400 mg     400 mg 200 mL/hr over 60 Minutes Intravenous On call to O.R. 10/03/15 0703 10/03/15 0930      Assessment/Plan: s/p Procedure(s): PARTIAL COLECTOMY Impression: Stable on postoperative day 2. Hypomagnesemia has resolved. We'll advance to full liquid diet. Continue anorectic at this time. Will get patient to ambulate.  LOS: 2 days    Janel Beane,Vuk A 10/05/2015

## 2015-10-06 LAB — BASIC METABOLIC PANEL
Anion gap: 9 (ref 5–15)
BUN: 10 mg/dL (ref 6–20)
CALCIUM: 8.5 mg/dL — AB (ref 8.9–10.3)
CO2: 26 mmol/L (ref 22–32)
CREATININE: 0.96 mg/dL (ref 0.61–1.24)
Chloride: 100 mmol/L — ABNORMAL LOW (ref 101–111)
Glucose, Bld: 93 mg/dL (ref 65–99)
Potassium: 3.9 mmol/L (ref 3.5–5.1)
Sodium: 135 mmol/L (ref 135–145)

## 2015-10-06 LAB — MAGNESIUM: MAGNESIUM: 1.7 mg/dL (ref 1.7–2.4)

## 2015-10-06 LAB — CBC
HCT: 32.7 % — ABNORMAL LOW (ref 39.0–52.0)
Hemoglobin: 10.8 g/dL — ABNORMAL LOW (ref 13.0–17.0)
MCH: 26.1 pg (ref 26.0–34.0)
MCHC: 33 g/dL (ref 30.0–36.0)
MCV: 79 fL (ref 78.0–100.0)
PLATELETS: 306 10*3/uL (ref 150–400)
RBC: 4.14 MIL/uL — AB (ref 4.22–5.81)
RDW: 13.8 % (ref 11.5–15.5)
WBC: 6.7 10*3/uL (ref 4.0–10.5)

## 2015-10-06 LAB — PHOSPHORUS: Phosphorus: 3.5 mg/dL (ref 2.5–4.6)

## 2015-10-06 MED ORDER — ENSURE ENLIVE PO LIQD: 237.0000 mL | Freq: Two times a day (BID) | ORAL | Status: DC

## 2015-10-06 NOTE — Progress Notes (Signed)
3 Days Post-Op  Subjective: Is passing gas and did walk the hallway. Has not had a bowel movement yet.  Objective: Vital signs in last 24 hours: Temp:  [98.2 F (36.8 C)-98.8 F (37.1 C)] 98.2 F (36.8 C) (04/20 0500) Pulse Rate:  [88-101] 88 (04/20 0500) Resp:  [18-19] 18 (04/20 0500) BP: (110-125)/(68-74) 110/70 mmHg (04/20 0500) SpO2:  [92 %-97 %] 97 % (04/20 0500) Last BM Date: 10/02/15  Intake/Output from previous day: 04/19 0701 - 04/20 0700 In: 480 [P.O.:480] Out: -  Intake/Output this shift:    General appearance: alert, cooperative and no distress Resp: clear to auscultation bilaterally Cardio: regular rate and rhythm, S1, S2 normal, no murmur, click, rub or gallop GI: Soft, bowel sounds active. Incision healing well.  Lab Results:   Recent Labs  10/05/15 0545 10/06/15 0629  WBC 9.5 6.7  HGB 11.6* 10.8*  HCT 35.7* 32.7*  PLT 325 306   BMET  Recent Labs  10/05/15 0545 10/06/15 0629  NA 132* 135  K 3.8 3.9  CL 100* 100*  CO2 25 26  GLUCOSE 91 93  BUN 10 10  CREATININE 0.92 0.96  CALCIUM 8.3* 8.5*   PT/INR No results for input(s): LABPROT, INR in the last 72 hours.  Studies/Results: No results found.  Anti-infectives: Anti-infectives    Start     Dose/Rate Route Frequency Ordered Stop   10/03/15 0703  metroNIDAZOLE (FLAGYL) IVPB 500 mg     500 mg 100 mL/hr over 60 Minutes Intravenous On call to O.R. 10/03/15 0703 10/03/15 0915   10/03/15 0703  ciprofloxacin (CIPRO) IVPB 400 mg     400 mg 200 mL/hr over 60 Minutes Intravenous On call to O.R. 10/03/15 0703 10/03/15 0930      Assessment/Plan: s/p Procedure(s): PARTIAL COLECTOMY Impression: Stable on postoperative day 3. Awaiting full return of bowel function. Initial pathology results show colon carcinoma. Patient and wife have been told of the results. Final pathology should be back later today.  LOS: 3 days    Gracelee Stemmler,Eliberto A 10/06/2015

## 2015-10-07 MED ORDER — ONDANSETRON HCL 4 MG PO TABS: 4.0000 mg | ORAL_TABLET | Freq: Three times a day (TID) | ORAL | Status: DC | PRN

## 2015-10-07 MED ORDER — OXYCODONE-ACETAMINOPHEN 7.5-325 MG PO TABS: 1.0000 | ORAL_TABLET | ORAL | Status: DC | PRN

## 2015-10-07 NOTE — Discharge Instructions (Signed)
Open Colectomy, Care After °Refer to this sheet in the next few weeks. These instructions provide you with information on caring for yourself after your procedure. Your health care provider may also give you more specific instructions. Your treatment has been planned according to current medical practices, but problems sometimes occur. Call your health care provider if you have any problems or questions after your procedure. °WHAT TO EXPECT AFTER THE PROCEDURE °After your procedure, it is typical to have the following: °· Pain in your abdomen, especially along your incision. You will be given medicines to control the pain. °· Tiredness. This is a normal part of the recovery process. Your energy level will return to normal over the next several weeks. °· Constipation. You may be given a stool softener to prevent this. °HOME CARE INSTRUCTIONS °· Only take over-the-counter or prescription medicines as directed by your health care provider. °· Ask your health care provider whether you may take a shower when you go home. °· You may resume a normal diet and activities as directed. Eat plenty of fruits and vegetables to help prevent constipation. °· Drink enough fluids to keep your urine clear or pale yellow. This also helps prevent constipation. °· Take rest breaks during the day as needed. °· Avoid lifting anything heavier than 25 pounds (11.3 kg) or driving for 4 weeks or until your health care provider says it is okay. °· Follow up with your health care provider as directed. Ask your health care provider when you need to return to have your stitches or staples removed. °SEEK MEDICAL CARE IF: °· You have redness, swelling, or increasing pain in the incision area. °· You see pus coming from the incision area. °· You have a fever. °SEEK IMMEDIATE MEDICAL CARE IF:  °· You have chest pain or shortness of breath. °· You have pain or swelling in your legs. °· You have persistent nausea and vomiting. °· Your wound breaks open  after stitches or staples have been removed. °· You have increasing abdominal pain that is not relieved with medicine. °  °This information is not intended to replace advice given to you by your health care provider. Make sure you discuss any questions you have with your health care provider. °  °Document Released: 12/26/2010 Document Revised: 03/25/2013 Document Reviewed: 01/14/2013 °Elsevier Interactive Patient Education ©2016 [employer]. ° °

## 2015-10-07 NOTE — Progress Notes (Signed)
Patient states understanding of discharge instructions, prescription given. 

## 2015-10-07 NOTE — Discharge Summary (Signed)
Physician Discharge Summary  Patient ID: Derek Rodriguez MRN: [id number] DOB/AGE: 1960/09/04 55 y.o.  Admit date: 10/03/2015 Discharge date: 10/07/2015  Admission Diagnoses: Inflammation of sigmoid colon, presumed diverticulitis   Discharge Diagnoses: Adenocarcinoma of sigmoid colon Active Problems:   S/P partial colectomy   Discharged Condition: good  Hospital Course: Patient is a 55 year old white male who had multiple recent episodes of admissions for recurrent inflammation of the sigmoid colon. It was presumed that he had sigmoid diverticulitis, though malignancy had not been fully ruled out. He underwent a partial colectomy on 10/03/2015. He tolerated the procedure well. Postoperative course was remarkable for mild hypomagnesemia which was treated. His bowel function returned as expected. His diet was advanced at difficulty. Final pathology revealed adenocarcinoma the sigmoid colon with multiple satellite lesions in 11 out of 12 lymph nodes positive. The mucosal margins of the colon resection are clear. Patient has been made aware of the pathology findings.  Treatments: surgery: Partial colectomy on 10/03/2015  Discharge Exam: Blood pressure 106/68, pulse 90, temperature 98.1 F (36.7 C), temperature source Oral, resp. rate 16, height 6' (1.829 m), weight 106.595 kg (235 lb), SpO2 98 %. General appearance: alert, cooperative and no distress Resp: clear to auscultation bilaterally Cardio: regular rate and rhythm, S1, S2 normal, no murmur, click, rub or gallop GI: Soft, incision healing well.  Disposition: 01-Home or Self Care     Medication List    TAKE these medications        lisinopril 10 MG tablet  Commonly known as:  PRINIVIL,ZESTRIL  Take 10 mg by mouth daily.     ondansetron 4 MG tablet  Commonly known as:  ZOFRAN  Take 1 tablet (4 mg total) by mouth every 6 (six) hours as needed.     ondansetron 4 MG tablet  Commonly known as:  ZOFRAN  Take 1 tablet (4 mg total)  by mouth every 8 (eight) hours as needed for nausea or vomiting.     oxyCODONE-acetaminophen 7.5-325 MG tablet  Commonly known as:  PERCOCET  Take 1 tablet by mouth 2 (two) times daily.     oxyCODONE-acetaminophen 7.5-325 MG tablet  Commonly known as:  PERCOCET  Take 1-2 tablets by mouth every 4 (four) hours as needed.           Follow-up Information    Follow up with [name], MD. Schedule an appointment as soon as possible for a visit on 10/13/2015.   Specialty:  General Surgery   Contact information:   [id number] [phone]       Signed: Aviva Signs A 10/07/2015, 12:09 PM

## 2015-10-10 ENCOUNTER — Telehealth: Payer: Self-pay | Admitting: Gastroenterology

## 2015-10-10 ENCOUNTER — Other Ambulatory Visit: Payer: Self-pay

## 2015-10-10 DIAGNOSIS — C187 Malignant neoplasm of sigmoid colon: Secondary | ICD-10-CM

## 2015-10-10 LAB — TYPE AND SCREEN
ABO/RH(D): A NEG
ANTIBODY SCREEN: NEGATIVE
UNIT DIVISION: 0
Unit division: 0

## 2015-10-10 NOTE — Telephone Encounter (Signed)
Patient has appt with me in May. He will need a colonoscopy in 6 months. New diagnosis of sigmoid colon adenocarcinoma with mucosal margins of colon resection clear. 11 out of 12 lymph nodes positive. I don't see where he has been referred to oncology? He has a CT abd/pelvis on file but no CT chest. I am unsure if Dr. Arnoldo Morale is sending him to Oncology or not.   Please refer him to Oncology ASAP to establish care for new diagnosis of colon cancer s/p resection. +Lymph node involvement. Will likely need CT chest.

## 2015-10-10 NOTE — Telephone Encounter (Signed)
Referral sent in Epic to Cancer center

## 2015-10-10 NOTE — Telephone Encounter (Signed)
Agree with plan 

## 2015-10-13 ENCOUNTER — Encounter (HOSPITAL_COMMUNITY): Payer: Self-pay

## 2015-10-18 ENCOUNTER — Encounter (HOSPITAL_COMMUNITY): Payer: Self-pay | Admitting: Oncology

## 2015-10-18 ENCOUNTER — Encounter (HOSPITAL_COMMUNITY): Payer: BLUE CROSS/BLUE SHIELD

## 2015-10-18 ENCOUNTER — Encounter (HOSPITAL_COMMUNITY): Payer: BLUE CROSS/BLUE SHIELD | Attending: Oncology | Admitting: Oncology

## 2015-10-18 VITALS — BP 125/90 | HR 74 | Temp 98.4°F | Resp 16 | Wt 230.0 lb

## 2015-10-18 DIAGNOSIS — C189 Malignant neoplasm of colon, unspecified: Secondary | ICD-10-CM | POA: Insufficient documentation

## 2015-10-18 DIAGNOSIS — M1990 Unspecified osteoarthritis, unspecified site: Secondary | ICD-10-CM | POA: Insufficient documentation

## 2015-10-18 DIAGNOSIS — Z87891 Personal history of nicotine dependence: Secondary | ICD-10-CM | POA: Diagnosis not present

## 2015-10-18 DIAGNOSIS — K5792 Diverticulitis of intestine, part unspecified, without perforation or abscess without bleeding: Secondary | ICD-10-CM | POA: Insufficient documentation

## 2015-10-18 DIAGNOSIS — I10 Essential (primary) hypertension: Secondary | ICD-10-CM | POA: Diagnosis not present

## 2015-10-18 DIAGNOSIS — Z833 Family history of diabetes mellitus: Secondary | ICD-10-CM | POA: Insufficient documentation

## 2015-10-18 DIAGNOSIS — Z9889 Other specified postprocedural states: Secondary | ICD-10-CM | POA: Diagnosis not present

## 2015-10-18 DIAGNOSIS — D649 Anemia, unspecified: Secondary | ICD-10-CM | POA: Insufficient documentation

## 2015-10-18 DIAGNOSIS — Z79899 Other long term (current) drug therapy: Secondary | ICD-10-CM | POA: Insufficient documentation

## 2015-10-18 DIAGNOSIS — C187 Malignant neoplasm of sigmoid colon: Secondary | ICD-10-CM | POA: Insufficient documentation

## 2015-10-18 HISTORY — DX: Malignant neoplasm of sigmoid colon: C18.7

## 2015-10-18 LAB — COMPREHENSIVE METABOLIC PANEL
ALT: 17 U/L (ref 17–63)
ANION GAP: 9 (ref 5–15)
AST: 19 U/L (ref 15–41)
Albumin: 3.9 g/dL (ref 3.5–5.0)
Alkaline Phosphatase: 72 U/L (ref 38–126)
BUN: 15 mg/dL (ref 6–20)
CHLORIDE: 105 mmol/L (ref 101–111)
CO2: 25 mmol/L (ref 22–32)
Calcium: 9.2 mg/dL (ref 8.9–10.3)
Creatinine, Ser: 1.02 mg/dL (ref 0.61–1.24)
GFR calc non Af Amer: >60 mL/min (ref >60)
Glucose, Bld: 105 mg/dL — ABNORMAL HIGH (ref 65–99)
POTASSIUM: 4.2 mmol/L (ref 3.5–5.1)
SODIUM: 139 mmol/L (ref 135–145)
Total Bilirubin: 0.4 mg/dL (ref 0.3–1.2)
Total Protein: 7.6 g/dL (ref 6.5–8.1)

## 2015-10-18 LAB — CBC WITH DIFFERENTIAL/PLATELET
Basophils Absolute: 0.1 10*3/uL (ref 0.0–0.1)
Basophils Relative: 1 %
EOS ABS: 0.2 10*3/uL (ref 0.0–0.7)
EOS PCT: 2 %
HCT: 41.2 % (ref 39.0–52.0)
Hemoglobin: 13.3 g/dL (ref 13.0–17.0)
LYMPHS ABS: 2.2 10*3/uL (ref 0.7–4.0)
Lymphocytes Relative: 21 %
MCH: 25.6 pg — AB (ref 26.0–34.0)
MCHC: 32.3 g/dL (ref 30.0–36.0)
MCV: 79.4 fL (ref 78.0–100.0)
MONO ABS: 0.6 10*3/uL (ref 0.1–1.0)
Monocytes Relative: 6 %
Neutro Abs: 7.3 10*3/uL (ref 1.7–7.7)
Neutrophils Relative %: 70 %
PLATELETS: 419 10*3/uL — AB (ref 150–400)
RBC: 5.19 MIL/uL (ref 4.22–5.81)
RDW: 14.4 % (ref 11.5–15.5)
WBC: 10.4 10*3/uL (ref 4.0–10.5)

## 2015-10-18 LAB — FERRITIN: Ferritin: 54 ng/mL (ref 24–336)

## 2015-10-18 LAB — VITAMIN B12: VITAMIN B 12: 254 pg/mL (ref 180–914)

## 2015-10-18 LAB — FOLATE: FOLATE: 14.8 ng/mL (ref >5.9)

## 2015-10-18 MED ORDER — CAPECITABINE 500 MG PO TABS: 2000.0000 mg | ORAL_TABLET | Freq: Two times a day (BID) | ORAL | Status: DC

## 2015-10-18 NOTE — Patient Instructions (Addendum)
[facility] at [facility] Discharge Instructions  RECOMMENDATIONS MADE BY THE CONSULTANT AND ANY TEST RESULTS WILL BE SENT TO YOUR REFERRING PHYSICIAN.  Exam per Kirby Crigler PA-C and Dr. Natale Lay our nurse navigator will arrange your chemo teaching prior to  Starting chemo on May 19th Labs today- mainly to check your iron levels because your hemoglobin has decreased  Thank you for choosing [place] at [facility] to provide your oncology and hematology care.  To afford each patient quality time with our provider, please arrive at least 15 minutes before your scheduled appointment time.   Beginning January 23rd 2017 lab work for the [facility] will be done in the  Main lab at [facility] on 1st floor. If you have a lab appointment with the [facility] please come in thru the  Main Entrance and check in at the main information desk  You need to re-schedule your appointment should you arrive 10 or more minutes late.  We strive to give you quality time with our providers, and arriving late affects you and other patients whose appointments are after yours.  Also, if you no show three or more times for appointments you may be dismissed from the clinic at the providers discretion.     Again, thank you for choosing [facility].  Our hope is that these requests will decrease the amount of time that you wait before being seen by our physicians.       _____________________________________________________________  Should you have questions after your visit to [facility], please contact our office at [phone] between the hours of 8:30 a.m. and 4:30 p.m.  Voicemails left after 4:30 p.m. will not be returned until the following business day.  For prescription refill requests, have your pharmacy contact our office.         Resources For Cancer Patients and their Caregivers ? American Cancer Society: Can  assist with transportation, wigs, general needs, runs Look Good Feel Better.        2534478994 ? Cancer Care: Provides financial assistance, online support groups, medication/co-pay assistance.  1-800-813-HOPE (410)632-9516) ? [place] Assists [place] cancer patients and their families through emotional , educational and financial support.  (978)793-3334 ? [place] DSS Where to apply for food stamps, Medicaid and utility assistance. 650-652-7048 ? [facility]: Transportation to medical appointments. 360-215-2693 ? Social Security Administration: May apply for disability if have a Stage IV cancer. 564-524-4768 (520)397-8240 ? [employer], Disability and Transit Services: Assists with nutrition, care and transit needs. (365)827-3976

## 2015-10-18 NOTE — Progress Notes (Signed)
[facility] Hematology/Oncology Consultation   Name: Derek Rodriguez      MRN: [id number]    Location: Room/bed info not found  Date: 10/18/2015 Time:5:43 PM   REFERRING PHYSICIAN:  Aviva Signs, MD (Gen Surg)  REASON FOR CONSULT:  Adenocarcinoma of sigmoid colon.   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  HISTORY OF PRESENT ILLNESS:   Derek Rodriguez is a 55 year old white American man with a past medical history significant for diverticulitis and HTN who recently was diagnosed with a Stage IIIC ([id number]) adenocarcinoma of sigmoid colon cancer, S/P partial colectomy by Dr. Arnoldo Morale.    Adenocarcinoma of sigmoid colon (Derek Rodriguez)   09/09/2015 - 09/12/2015 Hospital Admission Diverticulitis of colon with perforation   09/12/2015 Imaging CT abd/pelvis- Continued severe inflammation of the proximal half of the sigmoid colon. There is a 2.6 cm oval low-attenuation structure extending between the posterior wall of the inflamed sigmoid colon and the dome of the bladder....   09/27/2015 Tumor Marker CEA: 2.5    10/03/2015 Procedure Partial colectomy, splenic flexure takedown by Dr. Arnoldo Morale   10/03/2015 Pathology Results FULL THICKNESS INVASIVE MODERATELY TO POORLY DIFFERENTIATED ADENOCARCINOMA WITH SURFACE ULCERATION. TUMOR INVADES THROUGH MUSCULARIS MUCOSA AND PERFORATES SEROSAL SURFACE. - TUMOR MEASURES 10.3 CM IN GREATEST DIMENSION. - EXTENSIVE LYMPH/VASCULAR INVASI   10/13/2015 Pathology Results MSI STABLE    I personally reviewed and went over laboratory results with the patient.  The results are noted within this dictation.  Preoperative CEA is WNL.  Anemia is noted.  I personally reviewed and went over radiographic studies with the patient.  The results are noted within this dictation.  CT scan of abd/pelvis does not demonstrate metastatic disease.  Chart reviewed.  Beginning in December 2016, patient developed a sharp, stabbing pain in abdomen.  He notes that it  would come and go, but sometimes, "it felt like some one twisted a knife in my abdomen."  He admits that he avoided physicians.  He reports, at that time, 10 bloody BMs per day.  He did come down with a sinusitis that lead him to his primary care provider.  He discussed this abdominal pain with his primary care provider.  He was subsequently referred to GI.  In the interim, the going diagnosis at that time was renal calculus.  As a result, CT scan was performed.  No renal calculi identified, but he had imaging showing acute diverticulitis.  He was treated with antibiotics.  He did see [facility] and underwent colonoscopy by Dr. Earley Brooke.  He reported to [facility] ED prior to his scheduled colonoscopy for abdominal pain.  Since he was scheduled approximately 4 days later for colonoscopy, he was discharged from the ED with pain medication.  He reports that his colonoscopy was only 1/2 completed due to the infection.  At his return appointment, he notes that Dr. Earley Brooke did not have pathology back and therefore, the patient decided to pursue a different route with regards to his care.  He reported to [facility] ED again at the end of March 2017 with diverticulitis.  He was seen by Dr. Arnoldo Morale (Gen Surg) for further evaluation as an inpatient.  Electively, the patient underwent partial colectomy on 10/03/2015 by Dr. Arnoldo Morale leading to the aforementioned diagnosis.  Review of Systems  Constitutional: Positive for weight loss (35 lbs weight loss since Dec 2016). Negative for fever, chills, malaise/fatigue and diaphoresis.  HENT: Negative.   Eyes: Negative.   Respiratory:  Negative.   Cardiovascular: Negative.   Gastrointestinal: Negative.   Genitourinary: Negative.   Musculoskeletal: Negative.   Skin: Negative.   Neurological: Negative.  Negative for weakness.  Endo/Heme/Allergies: Negative.   Psychiatric/Behavioral: Negative.   14 point review of systems was performed and is negative except as detailed under history of  present illness and above   PAST MEDICAL HISTORY:   Past Medical History  Diagnosis Date  . Diverticulitis   . Hypertension   . Arthritis   . Adenocarcinoma of sigmoid colon ([place]) 10/18/2015    ALLERGIES: No Known Allergies    MEDICATIONS: I have reviewed the patient's current medications.    Current Outpatient Prescriptions on File Prior to Visit  Medication Sig Dispense Refill  . lisinopril (PRINIVIL,ZESTRIL) 10 MG tablet Take 10 mg by mouth daily.    Marland Kitchen oxyCODONE-acetaminophen (PERCOCET) 7.5-325 MG tablet Take 1-2 tablets by mouth every 4 (four) hours as needed. 60 tablet 0  . ondansetron (ZOFRAN) 4 MG tablet Take 1 tablet (4 mg total) by mouth every 8 (eight) hours as needed for nausea or vomiting. (Patient not taking: Reported on 10/18/2015) 20 tablet 0   No current facility-administered medications on file prior to visit.     PAST SURGICAL HISTORY Past Surgical History  Procedure Laterality Date  . Partial colectomy N/A 10/03/2015    Procedure: PARTIAL COLECTOMY;  Surgeon: Aviva Signs, MD;  Location: [facility];  Service: General;  Laterality: N/A;    FAMILY HISTORY: Family History  Problem Relation Age of Onset  . Diabetes Father    No known family history of cancer Mother alive at age 49- healthy Father deceased at age of 19 from heart disease, complications of DM 23 son 26 years old- healthy 70 grandson 40 year old- healthy  SOCIAL HISTORY:  reports that he quit smoking about 30 years ago. His smoking use included Cigars and Pipe. He quit smokeless tobacco use about 3 months ago. His smokeless tobacco use included Snuff. He reports that he does not drink alcohol or use illicit drugs.  He works at [employer].  He is married x 37 years.  He is [employer].  PERFORMANCE STATUS: The patient's performance status is 0 - Asymptomatic  PHYSICAL EXAM: Most Recent Vital Signs: Blood pressure 125/90, pulse 74, temperature 98.4 F (36.9 C), temperature source Oral, resp. rate 16,  weight 230 lb (104.327 kg), SpO2 99 %. General appearance: alert, cooperative, appears stated age, no distress, mildly obese and accompanied by his wife. Head: Normocephalic, without obvious abnormality, atraumatic Eyes: negative findings: lids and lashes normal, conjunctivae and sclerae normal and corneas clear Throat: normal findings: lips normal without lesions, buccal mucosa normal, tongue midline and normal, soft palate, uvula, and tonsils normal and oropharynx pink & moist without lesions or evidence of thrush and abnormal findings: dentition: poor Neck: no adenopathy, supple, symmetrical, trachea midline and thyroid not enlarged, symmetric, no tenderness/mass/nodules Lungs: clear to auscultation bilaterally and normal percussion bilaterally Heart: regular rate and rhythm, S1, S2 normal, no murmur, click, rub or gallop Abdomen: soft, non-tender; bowel sounds normal; no masses,  no organomegaly and low-midline surgical site is well healed Extremities: extremities normal, atraumatic, no cyanosis or edema Skin: Skin color, texture, turgor normal. No rashes or lesions Neurologic: Alert and oriented X 3, normal strength and tone. Normal symmetric reflexes. Normal coordination and gait  LABORATORY DATA:  CBC    Component Value Date/Time   WBC 10.4 10/18/2015 1357   RBC 5.19 10/18/2015 1357   HGB 13.3 10/18/2015  1357   HCT 41.2 10/18/2015 1357   PLT 419* 10/18/2015 1357   MCV 79.4 10/18/2015 1357   MCH 25.6* 10/18/2015 1357   MCHC 32.3 10/18/2015 1357   RDW 14.4 10/18/2015 1357   LYMPHSABS 2.2 10/18/2015 1357   MONOABS 0.6 10/18/2015 1357   EOSABS 0.2 10/18/2015 1357   BASOSABS 0.1 10/18/2015 1357      Chemistry      Component Value Date/Time   NA 139 10/18/2015 1357   K 4.2 10/18/2015 1357   CL 105 10/18/2015 1357   CO2 25 10/18/2015 1357   BUN 15 10/18/2015 1357   CREATININE 1.02 10/18/2015 1357      Component Value Date/Time   CALCIUM 9.2 10/18/2015 1357   ALKPHOS 72  10/18/2015 1357   AST 19 10/18/2015 1357   ALT 17 10/18/2015 1357   BILITOT 0.4 10/18/2015 1357     Lab Results  Component Value Date   CEA 2.5 09/27/2015     RADIOGRAPHY:  09/09/2015  CLINICAL DATA: Abdominal pain, had colonoscopy at an outside facility recently, left lower quadrant pain  EXAM: CT ABDOMEN AND PELVIS WITH CONTRAST  TECHNIQUE: Multidetector CT imaging of the abdomen and pelvis was performed using the standard protocol following bolus administration of intravenous contrast.  CONTRAST: 118m OMNIPAQUE IOHEXOL 300 MG/ML SOLN, 524mOMNIPAQUE IOHEXOL 300 MG/ML SOLN  COMPARISON: 08/22/2015  FINDINGS: Lower chest: The visualized portions of the lung bases are clear.  Hepatobiliary: Mild diffuse steatosis of the liver. Gallbladder is normal.  Pancreas: Mild fatty atrophy of the pancreas.  Spleen: Spleen is normal  Adrenals/Urinary Tract: Adrenal glands are normal. Kidneys are normal. No hydronephrosis. Bladder shows wall thickening particularly at the dome similar to prior study.  Stomach/Bowel: There is severe wall thickening involving the sigmoid colon proximal half, the same area that was previously involved. There is surrounding inflammatory change. There is a nonobstructive bowel gas pattern. There is stool throughout the colon. No other bowel abnormalities.  Vascular/Lymphatic: No significant vascular abnormality. There are prominent lymph nodes in the peritoneal fat adjacent to the sigmoid colon measuring up to 9 mm with surrounding inflammatory change. At the root of the mesentery there are also mildly prominent lymph nodes measuring up to 10 mm.  Reproductive: No significant abnormalities  Other: There is a 2.6 cm mildly low-attenuation structure extending between the inferior wall the inflamed sigmoid colon can the dome of the bladder. This was present previously.  Musculoskeletal: No acute  findings  IMPRESSION: Continued severe inflammation of the proximal half of the sigmoid colon. There is a 2.6 cm oval low-attenuation structure extending between the posterior wall of the inflamed sigmoid colon and the dome of the bladder. This could represent a contained perforation or small abscess.   Electronically Signed  By: RaSkipper Cliche.D.  On: 09/09/2015 11:08       PATHOLOGY:    Diagnosis Colon, segmental resection, sigmoid - FULL THICKNESS INVASIVE MODERATELY TO POORLY DIFFERENTIATED ADENOCARCINOMA WITH SURFACE ULCERATION. 1 of 4 FINAL for MA[place]in[place]MARK ([phone](continued) - TUMOR INVADES THROUGH MUSCULARIS MUCOSA AND PERFORATES SEROSAL SURFACE. - TUMOR MEASURES 10.3 CM IN GREATEST DIMENSION. - EXTENSIVE LYMPH/VASCULAR INVASION IS IDENTIFIED. - DISTAL MARGIN DEMONSTRATES ADENOCARCINOMA INVOLVING THE LYMPH/VASCULAR SPACES. - PROXIMAL MARGIN IS NEGATIVE. - ELEVEN OF TWELVE LYMPH NODES ARE POSITIVE FOR METASTATIC ADENOCARCINOMA (11/12). - EXTRACAPSULAR EXTENSION IS PRESENT. 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  ASSESSMENT/PLAN:   Aden[place]moid colon ([place]  adenocarcinoma of sigmoid colon cancer, MSI STABLE, moderate to poorly differentiated, invading through the muscularis mucosa and perforating the serosal surface, measuring 10.3 CM in largest dimension, extensive LVI, 11/12 positive lymph nodes, extracapsular extension is present, positive tumor satellite nodules are noted.  Unfortunately he is at high risk for recurrence and I strongly emphasized the need for adjuvant therapy. I would also recommend potential referral near the completion of therapy for HIPEC evaluation at [facility]. He is certainly an appropriate candidate for therapy.  NCCN guidelines were reviewed in detail with the patient and his wife.    The patient does not want a port. He notes  he may be willing to take "the pills". In spite of discussing the importance and benefit of adjuvant chemotherapy in preventing recurrence and his high risk of recurrence he still seemed somewhat hesitant to proceed -- mostly from concerns about his ability to work.   He was planning on taking all of his FMLA now after his surgery.  I have urged him to return to work as soon as cleared by Dr. Arnoldo Morale and save some FMLA to use during chemotherapy.   Oncology history developed.  Staging completed in [facility] problem list.  I have reviewed the patient's op note.  I do not see a complete colonoscopy.  I will discuss with Dr. Arnoldo Morale.    Patient does not want a port.  Will give Oxaliplatin peripherally.    Calculated dose of Capecitabine is 2000 mg BID days 1-14 every 21 days.  Rx is printed and given to Angie.  Labs today: CBC diff, CMET, anemia panel.  CT chest w contrast is ordered to complete staging.  He will return for chemotherapy teaching.  He will return on 11/04/2015 for Day 1 of Cycle 1 of CAPEOX.    All questions were answered. The patient knows to call the clinic with any problems, questions or concerns. We can certainly see the patient much sooner if necessary.  This note is electronically signed by: Molli Hazard, MD  10/18/2015 5:43 PM

## 2015-10-18 NOTE — Assessment & Plan Note (Addendum)
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  Oncology history developed.  Staging completed in [facility] problem list.  I have reviewed all data with the patient including stage of disease.  I have reviewed the role of systemic chemotherapy.  I have reviewed the NCCN guidelines with the patient.  He is a candidate for systemic chemotherapy.  He is concerned about port placement and 5 FU infusional pump as he wishes to work during this time if able.  FOLFOX is recommended, but due to his concerned, we discussed an alternative option, CAPEOX.   I have reviewed the patient's op note.  I do not see a complete colonoscopy.  I will discuss with Dr. Arnoldo Morale.    Patient does not want a port.  Will give Oxaliplatin peripherally.    Calculated dose of Capecitabine is 2000 mg BID days 1-14 every 21 days.  Rx is printed and given to Angie.  Labs today: CBC diff, CMET, anemia panel.  CT chest w contrast is ordered to complete staging.  He will return for chemotherapy teaching.  He will return on 11/04/2015 for Day 1 of Cycle 1 of CAPEOX.

## 2015-10-19 ENCOUNTER — Ambulatory Visit (HOSPITAL_COMMUNITY): Payer: BLUE CROSS/BLUE SHIELD | Admitting: Hematology & Oncology

## 2015-10-19 LAB — IRON AND TIBC
Iron: 37 ug/dL — ABNORMAL LOW (ref 45–182)
SATURATION RATIOS: 10 % — AB (ref 17.9–39.5)
TIBC: 350 ug/dL (ref 250–450)
UIBC: 323 ug/dL

## 2015-10-20 ENCOUNTER — Other Ambulatory Visit (HOSPITAL_COMMUNITY): Payer: Self-pay | Admitting: *Deleted

## 2015-10-20 ENCOUNTER — Ambulatory Visit (HOSPITAL_COMMUNITY)
Admission: RE | Admit: 2015-10-20 | Discharge: 2015-10-20 | Disposition: A | Discharge status: Home / Self Care | Payer: BLUE CROSS/BLUE SHIELD | Source: Ambulatory Visit | Attending: Oncology | Admitting: Oncology

## 2015-10-20 ENCOUNTER — Ambulatory Visit (HOSPITAL_COMMUNITY): Payer: BLUE CROSS/BLUE SHIELD

## 2015-10-20 DIAGNOSIS — C187 Malignant neoplasm of sigmoid colon: Secondary | ICD-10-CM | POA: Diagnosis not present

## 2015-10-20 MED ORDER — IOPAMIDOL (ISOVUE-300) INJECTION 61%
75.0000 mL | Freq: Once | INTRAVENOUS | Status: AC | PRN
  Administered 2015-10-20: 75 mL via INTRAVENOUS

## 2015-10-20 MED ORDER — CAPECITABINE 500 MG PO TABS: ORAL_TABLET | ORAL | Status: DC

## 2015-10-22 ENCOUNTER — Encounter (HOSPITAL_COMMUNITY): Payer: Self-pay | Admitting: Oncology

## 2015-10-24 ENCOUNTER — Telehealth: Payer: Self-pay | Admitting: Internal Medicine

## 2015-10-24 NOTE — Telephone Encounter (Signed)
Ok.  Derek Rodriguez wait until then.  We will plan on starting chemotherapy as planned on 5/19.  Thanks for your help.  Let me know if there is anything you need from me/us.  TK

## 2015-10-24 NOTE — Telephone Encounter (Signed)
Discussed early post-op colonoscopy with Dr. Arnoldo Morale.  He recommended holding off for 3 months to allow for maturing of surgical anastomosis. Would, therefore, recommend his colonoscopy be performed around 01/02/16.  He also said if it is critically important to R/O a persisting occult lesion of some size sooner, an ACBE could be done.  Please call if you have questions.

## 2015-10-26 ENCOUNTER — Telehealth (HOSPITAL_COMMUNITY): Payer: Self-pay | Admitting: Hematology & Oncology

## 2015-10-26 NOTE — Telephone Encounter (Signed)
PER MATTIE AT [facility] WAS SHIPPED OUT ON 10/25/15.

## 2015-10-27 MED ORDER — ONDANSETRON HCL 8 MG PO TABS: 8.0000 mg | ORAL_TABLET | Freq: Three times a day (TID) | ORAL | Status: DC | PRN

## 2015-10-27 MED ORDER — PROCHLORPERAZINE MALEATE 10 MG PO TABS: 10.0000 mg | ORAL_TABLET | Freq: Four times a day (QID) | ORAL | Status: DC | PRN

## 2015-10-27 MED ORDER — LIDOCAINE-PRILOCAINE 2.5-2.5 % EX CREA: TOPICAL_CREAM | CUTANEOUS | Status: DC

## 2015-10-27 NOTE — Patient Instructions (Signed)
[facility]   CHEMOTHERAPY INSTRUCTIONS  Premeds: Aloxi - this is for nausea/vomiting prevention/reduction. Dexamethasone- steroid - given to reduce the risk of you having an allergic type reaction to the chemotherapy. Dex can cause you to feel energized, nervous/anxious/jittery, make you have trouble sleeping, and/or make you feel hot/flushed in the face/neck and/or look pink/red in the face/neck. These side effects will pass as the Dex wears off. (takes 20 minutes to infuse)  Oxaliplatin - anaphylactic reaction, neurotoxicity (i.e., headache, fatigue, difficulty sleeping, pain). Peripheral neuropathy (numbness/tingling/burning in hands/fingers/feet/toes) - will be aggravated by cold/cool temperatures. We need to know when you develop peripheral neuropathy so that we can monitor it and treat if necessary. Nausea/vomiting, diarrhea, bone marrow suppression (lowers white blood cells (fight infection), lowers red blood cells (make up your blood), lowers platelets (help blood to clot). Pulmonary fibrosis. Once you have received Oxaliplatin do NOT eat or drinking anything cold/cool for 5-10 days! Do NOT breathe in cold/cool air and do NOT touch anything cold for 5-10 days. The time frame varies from patient to patient on the length of time you must abstain from the above mentioned. Best advice is to wait at least 5 days before attempting to reintroduce cold/cool back into life. Slowly reintroduce cool/cold things! Wear gloves when getting items out of the refrigerator (of course, these would be things you are going to heat to eat)!      (takes 2 hours to infuse)    Xeloda - diarrhea, hand-foot syndrome (hands/feet can get red/tender/and skin can peel). Avoid friction and hot environments on hands/feet. Wear cotton socks. Lotion twice a day to hands/fingers/feet/toes with Udder cream. Mucositis (inflammation of any mucosal membrane can develop -this can occur in the  throat/mouth). Mouth sores, nausea/vomiting, anemia, fatigue can also develop. Take Imodium if diarrhea develops and contact us immediately - we will give you further instructions on how to take your Imodium. Xeloda usually comes with a teaching packet from the mail order/specialty pharmacy that will supply you with the drug that is really informative about diarrhea and hand-foot syndrome. No pregnant, child bearing age people, or animals should come into contact with this drug. Even though this is in pill form - it is still very powerful!!! If you should be instructed to discontinue taking this drug, bring the drug into the [facility] and we will dispose of it in a safe manner. Chemotherapy is a biohazard and must be disposed of properly. Do not touch this pill much. It would be best if the caregiver wore gloves while handling. Do not put this pill in with the rest of your pills in a pill box. Keep them in a separate pill box. You should take this medication within 30 minutes after eating your am and pm meals. No grapefruit or grapefruit juice should be consumed while on this medication.   Your dosage of Xeloda 500mg  tablets:   Take 4 tabs (2,000mg  total) by mouth in the am after a meal and in the pm after a meal. Take for 14 days in a row followed by a 7 day rest period. Repeat.   POTENTIAL SIDE EFFECTS OF TREATMENT: Increased Susceptibility to Infection, Vomiting, Constipation, Hair Thinning, Changes in Character of Skin and Nails (brittleness, dryness,etc.), Pigment Changes (darkening of nail beds, palms of hands, soles of feet, etc.), Bone Marrow Suppression, Abdominal Cramping, Nausea, Diarrhea, Sun Sensitivity and Mouth Sores    EDUCATIONAL MATERIALS GIVEN AND REVIEWED: Chemotherapy and You booklet Specific Instructions  Sheets: Oxaliplatin, Xeloda, Zofran, Compazine, Aloxi, Dexamethasone   SELF CARE ACTIVITIES WHILE ON CHEMOTHERAPY: Increase your fluid intake 48 hours prior to treatment and  drink at least 2 quarts per day after treatment., No alcohol intake., No aspirin or other medications unless approved by your oncologist., Eat foods that are light and easy to digest., No fried, fatty, or spicy foods immediately before or after treatment., Have teeth cleaned professionally before starting treatment. Keep dentures and partial plates clean., Use soft toothbrush and do not use mouthwashes that contain alcohol. Biotene is a good mouthwash that is available at most pharmacies or may be ordered by calling [phone]., Use warm salt water gargles (1 teaspoon salt per 1 quart warm water) before and after meals and at bedtime. Or you may rinse with 2 tablespoons of three -percent hydrogen peroxide mixed in eight ounces of water., Always use sunscreen with SPF (Sun Protection Factor) of 50 or higher., Use your nausea medication as directed to prevent nausea., Use your stool softener or laxative as directed to prevent constipation. and Use your anti-diarrheal medication as directed to stop diarrhea.  Please wash your hands for at least 30 seconds using warm soapy water. Handwashing is the #1 way to prevent the spread of germs. Stay away from sick people or people who are getting over a cold. If you develop respiratory systems such as green/yellow mucus production or productive cough or persistent cough let us know and we will see if you need an antibiotic. It is a good idea to keep a pair of gloves on when going into grocery stores/[employer] to decrease your risk of coming into contact with germs on the carts, etc. Carry alcohol hand gel with you at all times and use it frequently if out in public. All foods need to be cooked thoroughly. No raw foods. No medium or undercooked meats, eggs. If your food is cooked medium well, it does not need to be hot pink or saturated with bloody liquid at all. Vegetables and fruits need to be washed/rinsed under the faucet with a dish detergent before being consumed. You  can eat raw fruits and vegetables unless we tell you otherwise but it would be best if you cooked them or bought frozen. Do not eat off of salad bars or hot bars unless you really trust the cleanliness of the restaurant. If you need dental work, please let Dr. Whitney Muse know before you go for your appointment so that we can coordinate the best possible time for you in regards to your chemo regimen. You need to also let your dentist know that you are actively taking chemo. We may need to do labs prior to your dental appointment. We also want your bowels moving at least every other day. If this is not happening, we need to know so that we can get you on a bowel regimen to help you go.       MEDICATIONS: You have been given prescriptions for the following medications:  Zofran/Ondansetron 8mg  tablet. Take 1 tablet every 8 hours as needed for nausea/vomiting. (#1 nausea med to take, this can constipate)  Compazine/Prochlorperazine 10mg  tablet. Take 1 tablet every 6 hours as needed for nausea/vomiting. (#2 nausea med to take, this can make you sleepy)   EMLA cream. Apply a quarter size amount to port site 1 hour prior to chemo. Do not rub in. Cover with plastic wrap.   Over-the-Counter Meds:  Miralax 1 capful in 8 oz of fluid daily. May increase to  two times a day if needed. This is a stool softener. If this doesn't work proceed you can add:  Senokot S  - start with 1 tablet two times a day and increase to 4 tablets two times a day if needed. (total of 8 tablets in a 24 hour period). This is a stimulant laxative.   Call us if this does not help your bowels move.   Imodium 2mg  capsule. Take 2 capsules after the 1st loose stool and then 1 capsule every 2 hours until you go a total of 12 hours without having a loose stool. Call the [facility] if loose stools continue. If diarrhea occurs @ bedtime, take 2 capsules @ bedtime. Then take 2 capsules every 4 hours until morning. Call [facility].   SYMPTOMS TO REPORT AS SOON AS POSSIBLE AFTER TREATMENT:  FEVER GREATER THAN 100.5 F  CHILLS WITH OR WITHOUT FEVER  NAUSEA AND VOMITING THAT IS NOT CONTROLLED WITH YOUR NAUSEA MEDICATION  UNUSUAL SHORTNESS OF BREATH  UNUSUAL BRUISING OR BLEEDING  TENDERNESS IN MOUTH AND THROAT WITH OR WITHOUT PRESENCE OF ULCERS  URINARY PROBLEMS  BOWEL PROBLEMS  UNUSUAL RASH    Wear comfortable clothing and clothing appropriate for easy access to any Portacath or PICC line. Let us know if there is anything that we can do to make your therapy better!      I have been informed and understand all of the instructions given to me and have received a copy. I have been instructed to call the clinic [phone] or my family physician as soon as possible for continued medical care, if indicated. I do not have any more questions at this time but understand that I may call the [facility] or the Patient Navigator at [phone] during office hours should I have questions or need assistance in obtaining follow-up care.            Oxaliplatin Injection What is this medicine? OXALIPLATIN (ox AL i PLA tin) is a chemotherapy drug. It targets fast dividing cells, like cancer cells, and causes these cells to die. This medicine is used to treat cancers of the colon and rectum, and many other cancers. This medicine may be used for other purposes; ask your health care provider or pharmacist if you have questions. What should I tell my health care provider before I take this medicine? They need to know if you have any of these conditions: -kidney disease -an unusual or allergic reaction to oxaliplatin, other chemotherapy, other medicines, foods, dyes, or preservatives -pregnant or trying to get pregnant -breast-feeding How should I use this medicine? This drug is given as an infusion into a vein. It is administered in a hospital or clinic by a specially trained health care  professional. Talk to your pediatrician regarding the use of this medicine in children. Special care may be needed. Overdosage: If you think you have taken too much of this medicine contact a poison control center or emergency room at once. NOTE: This medicine is only for you. Do not share this medicine with others. What if I miss a dose? It is important not to miss a dose. Call your doctor or health care professional if you are unable to keep an appointment. What may interact with this medicine? -medicines to increase blood counts like filgrastim, pegfilgrastim, sargramostim -probenecid -some antibiotics like amikacin, gentamicin, neomycin, polymyxin B, streptomycin, tobramycin -zalcitabine Talk to your doctor or health care professional before taking any of these medicines: -acetaminophen -  aspirin -ibuprofen -ketoprofen -naproxen This list may not describe all possible interactions. Give your health care provider a list of all the medicines, herbs, non-prescription drugs, or dietary supplements you use. Also tell them if you smoke, drink alcohol, or use illegal drugs. Some items may interact with your medicine. What should I watch for while using this medicine? Your condition will be monitored carefully while you are receiving this medicine. You will need important blood work done while you are taking this medicine. This medicine can make you more sensitive to cold. Do not drink cold drinks or use ice. Cover exposed skin before coming in contact with cold temperatures or cold objects. When out in cold weather wear warm clothing and cover your mouth and nose to warm the air that goes into your lungs. Tell your doctor if you get sensitive to the cold. This drug may make you feel generally unwell. This is not uncommon, as chemotherapy can affect healthy cells as well as cancer cells. Report any side effects. Continue your course of treatment even though you feel ill unless your doctor tells you  to stop. In some cases, you may be given additional medicines to help with side effects. Follow all directions for their use. Call your doctor or health care professional for advice if you get a fever, chills or sore throat, or other symptoms of a cold or flu. Do not treat yourself. This drug decreases your body's ability to fight infections. Try to avoid being around people who are sick. This medicine may increase your risk to bruise or bleed. Call your doctor or health care professional if you notice any unusual bleeding. Be careful brushing and flossing your teeth or using a toothpick because you may get an infection or bleed more easily. If you have any dental work done, tell your dentist you are receiving this medicine. Avoid taking products that contain aspirin, acetaminophen, ibuprofen, naproxen, or ketoprofen unless instructed by your doctor. These medicines may hide a fever. Do not become pregnant while taking this medicine. Women should inform their doctor if they wish to become pregnant or think they might be pregnant. There is a potential for serious side effects to an unborn child. Talk to your health care professional or pharmacist for more information. Do not breast-feed an infant while taking this medicine. Call your doctor or health care professional if you get diarrhea. Do not treat yourself. What side effects may I notice from receiving this medicine? Side effects that you should report to your doctor or health care professional as soon as possible: -allergic reactions like skin rash, itching or hives, swelling of the face, lips, or tongue -low blood counts - This drug may decrease the number of white blood cells, red blood cells and platelets. You may be at increased risk for infections and bleeding. -signs of infection - fever or chills, cough, sore throat, pain or difficulty passing urine -signs of decreased platelets or bleeding - bruising, pinpoint red spots on the skin, black,  tarry stools, nosebleeds -signs of decreased red blood cells - unusually weak or tired, fainting spells, lightheadedness -breathing problems -chest pain, pressure -cough -diarrhea -jaw tightness -mouth sores -nausea and vomiting -pain, swelling, redness or irritation at the injection site -pain, tingling, numbness in the hands or feet -problems with balance, talking, walking -redness, blistering, peeling or loosening of the skin, including inside the mouth -trouble passing urine or change in the amount of urine Side effects that usually do not require medical attention (report  to your doctor or health care professional if they continue or are bothersome): -changes in vision -constipation -hair loss -loss of appetite -metallic taste in the mouth or changes in taste -stomach pain This list may not describe all possible side effects. Call your doctor for medical advice about side effects. You may report side effects to FDA at 1-800-FDA-1088. Where should I keep my medicine? This drug is given in a hospital or clinic and will not be stored at home. NOTE: This sheet is a summary. It may not cover all possible information. If you have questions about this medicine, talk to your doctor, pharmacist, or health care provider.    2016, Elsevier/Gold Standard. (2007-12-30 17:22:47) Capecitabine tablets What is this medicine? CAPECITABINE (ka pe SITE a been) is a chemotherapy drug. It slows the growth of cancer cells. This medicine is used to treat breast cancer, and also colon or rectal cancer. This medicine may be used for other purposes; ask your health care provider or pharmacist if you have questions. What should I tell my health care provider before I take this medicine? They need to know if you have any of these conditions: -bleeding or blood disorders -dihydropyrimidine dehydrogenase (DPD) deficiency -heart disease -infection (especially a virus infection such as chickenpox, cold  sores, or herpes) -kidney disease -liver disease -an unusual or allergic reaction to capecitabine, 5-fluorouracil, other medicines, foods, dyes, or preservatives -pregnant or trying to get pregnant -breast-feeding How should I use this medicine? Take this medicine by mouth with a glass of water, within 30 minutes of the end of a meal. Do not cut, crush or chew this medicine. Follow the directions on the prescription label. Take your medicine at regular intervals. Do not take it more often than directed. Do not stop taking except on your doctor's advice. Your doctor may want you to take a combination of 150 mg and 500 mg tablets for each dose. It is very important that you know how to correctly take your dose. Taking the wrong tablets could result in an overdose (too much medication) or underdose (too little medication). Talk to your pediatrician regarding the use of this medicine in children. Special care may be needed. Overdosage: If you think you have taken too much of this medicine contact a poison control center or emergency room at once. NOTE: This medicine is only for you. Do not share this medicine with others. What if I miss a dose? If you miss a dose, do not take the missed dose at all. Do not take double or extra doses. Instead, continue with your next scheduled dose and check with your doctor. What may interact with this medicine? -antacids with aluminum and/or magnesium -folic acid -leucovorin -medicines to increase blood counts like filgrastim, pegfilgrastim, sargramostim -phenytoin -vaccines -warfarin Talk to your doctor or health care professional before taking any of these medicines: -acetaminophen -aspirin -ibuprofen -ketoprofen -naproxen This list may not describe all possible interactions. Give your health care provider a list of all the medicines, herbs, non-prescription drugs, or dietary supplements you use. Also tell them if you smoke, drink alcohol, or use illegal  drugs. Some items may interact with your medicine. What should I watch for while using this medicine? Visit your doctor for checks on your progress. This drug may make you feel generally unwell. This is not uncommon, as chemotherapy can affect healthy cells as well as cancer cells. Report any side effects. Continue your course of treatment even though you feel ill unless your doctor tells  you to stop. In some cases, you may be given additional medicines to help with side effects. Follow all directions for their use. Call your doctor or health care professional for advice if you get a fever, chills or sore throat, or other symptoms of a cold or flu. Do not treat yourself. This drug decreases your body's ability to fight infections. Try to avoid being around people who are sick. This medicine may increase your risk to bruise or bleed. Call your doctor or health care professional if you notice any unusual bleeding. Be careful brushing and flossing your teeth or using a toothpick because you may get an infection or bleed more easily. If you have any dental work done, tell your dentist you are receiving this medicine. Avoid taking products that contain aspirin, acetaminophen, ibuprofen, naproxen, or ketoprofen unless instructed by your doctor. These medicines may hide a fever. Do not become pregnant while taking this medicine. Women should inform their doctor if they wish to become pregnant or think they might be pregnant. There is a potential for serious side effects to an unborn child. Talk to your health care professional or pharmacist for more information. Do not breast-feed an infant while taking this medicine. Men are advised not to father a child while taking this medicine. What side effects may I notice from receiving this medicine? Side effects that you should report to your doctor or health care professional as soon as possible: -allergic reactions like skin rash, itching or hives, swelling of the  face, lips, or tongue -low blood counts - this medicine may decrease the number of white blood cells, red blood cells and platelets. You may be at increased risk for infections and bleeding. -signs of infection - fever or chills, cough, sore throat, pain or difficulty passing urine -signs of decreased platelets or bleeding - bruising, pinpoint red spots on the skin, black, tarry stools, blood in the urine -signs of decreased red blood cells - unusually weak or tired, fainting spells, lightheadedness -breathing problems -changes in vision -chest pain -dark urine -diarrhea of more than 4 bowel movements in one day or any diarrhea at night; bloody or watery diarrhea -dizziness -mouth sores -nausea and vomiting -pain, tingling, numbness in the hands or feet -redness, swelling, or sores on hands or feet -stomach pain -vomiting -yellow color of skin or eyes Side effects that usually do not require medical attention (report to your doctor or health care professional if they continue or are bothersome): -constipation -diarrhea -dry or itchy skin -hair loss -loss of appetite -nausea -weak or tired This list may not describe all possible side effects. Call your doctor for medical advice about side effects. You may report side effects to FDA at 1-800-FDA-1088. Where should I keep my medicine? Keep out of the reach of children. Store at room temperature between 15 and 30 degrees C (59 and 86 degrees F). Keep container tightly closed. Throw away any unused medicine after the expiration date. NOTE: This sheet is a summary. It may not cover all possible information. If you have questions about this medicine, talk to your doctor, pharmacist, or health care provider.    2016, Elsevier/Gold Standard. (2014-07-19 17:03:30) Palonosetron Injection What is this medicine? PALONOSETRON (pal oh NOE se tron) is used to prevent nausea and vomiting caused by chemotherapy. It also helps prevent delayed nausea  and vomiting that may occur a few days after your treatment. This medicine may be used for other purposes; ask your health care provider or pharmacist  if you have questions. What should I tell my health care provider before I take this medicine? They need to know if you have any of these conditions: -an unusual or allergic reaction to palonosetron, dolasetron, granisetron, ondansetron, other medicines, foods, dyes, or preservatives -pregnant or trying to get pregnant -breast-feeding How should I use this medicine? This medicine is for infusion into a vein. It is given by a health care professional in a hospital or clinic setting. Talk to your pediatrician regarding the use of this medicine in children. While this drug may be prescribed for children as young as 1 month for selected conditions, precautions do apply. Overdosage: If you think you have taken too much of this medicine contact a poison control center or emergency room at once. NOTE: This medicine is only for you. Do not share this medicine with others. What if I miss a dose? This does not apply. What may interact with this medicine? -certain medicines for depression, anxiety, or psychotic disturbances -fentanyl -linezolid -MAOIs like Carbex, Eldepryl, Marplan, Nardil, and Parnate -methylene blue (injected into a vein) -tramadol This list may not describe all possible interactions. Give your health care provider a list of all the medicines, herbs, non-prescription drugs, or dietary supplements you use. Also tell them if you smoke, drink alcohol, or use illegal drugs. Some items may interact with your medicine. What should I watch for while using this medicine? Your condition will be monitored carefully while you are receiving this medicine. What side effects may I notice from receiving this medicine? Side effects that you should report to your doctor or health care professional as soon as possible: -allergic reactions like skin  rash, itching or hives, swelling of the face, lips, or tongue -breathing problems -confusion -dizziness -fast, irregular heartbeat -fever and chills -loss of balance or coordination -seizures -sweating -swelling of the hands and feet -tremors -unusually weak or tired Side effects that usually do not require medical attention (report to your doctor or health care professional if they continue or are bothersome): -constipation or diarrhea -headache This list may not describe all possible side effects. Call your doctor for medical advice about side effects. You may report side effects to FDA at 1-800-FDA-1088. Where should I keep my medicine? This drug is given in a hospital or clinic and will not be stored at home. NOTE: This sheet is a summary. It may not cover all possible information. If you have questions about this medicine, talk to your doctor, pharmacist, or health care provider.    2016, [employer]. (2013-04-10 10:38:36) Dexamethasone injection What is this medicine? DEXAMETHASONE (dex a METH a sone) is a corticosteroid. It is used to treat inflammation of the skin, joints, lungs, and other organs. Common conditions treated include asthma, allergies, and arthritis. It is also used for other conditions, like blood disorders and diseases of the adrenal glands. This medicine may be used for other purposes; ask your health care provider or pharmacist if you have questions. What should I tell my health care provider before I take this medicine? They need to know if you have any of these conditions: -blood clotting problems -Cushing's syndrome -diabetes -glaucoma -heart problems or disease -high blood pressure -infection like herpes, measles, tuberculosis, or chickenpox -kidney disease -liver disease -mental problems -myasthenia gravis -osteoporosis -previous heart attack -seizures -stomach, ulcer or intestine disease including colitis and  diverticulitis -thyroid problem -an unusual or allergic reaction to dexamethasone, corticosteroids, other medicines, lactose, foods, dyes, or preservatives -pregnant or trying to get  pregnant -breast-feeding How should I use this medicine? This medicine is for injection into a muscle, joint, lesion, soft tissue, or vein. It is given by a health care professional in a hospital or clinic setting. Talk to your pediatrician regarding the use of this medicine in children. Special care may be needed. Overdosage: If you think you have taken too much of this medicine contact a poison control center or emergency room at once. NOTE: This medicine is only for you. Do not share this medicine with others. What if I miss a dose? This may not apply. If you are having a series of injections over a prolonged period, try not to miss an appointment. Call your doctor or health care professional to reschedule if you are unable to keep an appointment. What may interact with this medicine? Do not take this medicine with any of the following medications: -mifepristone, RU-486 -vaccines This medicine may also interact with the following medications: -amphotericin B -antibiotics like clarithromycin, erythromycin, and troleandomycin -aspirin and aspirin-like drugs -barbiturates like phenobarbital -carbamazepine -cholestyramine -cholinesterase inhibitors like donepezil, galantamine, rivastigmine, and tacrine -cyclosporine -digoxin -diuretics -ephedrine -male hormones, like estrogens or progestins and birth control pills -indinavir -isoniazid -ketoconazole -medicines for diabetes -medicines that improve muscle tone or strength for conditions like myasthenia gravis -NSAIDs, medicines for pain and inflammation, like ibuprofen or naproxen -phenytoin -rifampin -thalidomide -warfarin This list may not describe all possible interactions. Give your health care provider a list of all the medicines, herbs,  non-prescription drugs, or dietary supplements you use. Also tell them if you smoke, drink alcohol, or use illegal drugs. Some items may interact with your medicine. What should I watch for while using this medicine? Your condition will be monitored carefully while you are receiving this medicine. If you are taking this medicine for a long time, carry an identification card with your name and address, the type and dose of your medicine, and your doctor's name and address. This medicine may increase your risk of getting an infection. Stay away from people who are sick. Tell your doctor or health care professional if you are around anyone with measles or chickenpox. Talk to your health care provider before you get any vaccines that you take this medicine. If you are going to have surgery, tell your doctor or health care professional that you have taken this medicine within the last twelve months. Ask your doctor or health care professional about your diet. You may need to lower the amount of salt you eat. The medicine can increase your blood sugar. If you are a diabetic check with your doctor if you need help adjusting the dose of your diabetic medicine. What side effects may I notice from receiving this medicine? Side effects that you should report to your doctor or health care professional as soon as possible: -allergic reactions like skin rash, itching or hives, swelling of the face, lips, or tongue -black or tarry stools -change in the amount of urine -changes in vision -confusion, excitement, restlessness, a false sense of well-being -fever, sore throat, sneezing, cough, or other signs of infection, wounds that will not heal -hallucinations -increased thirst -mental depression, mood swings, mistaken feelings of self importance or of being mistreated -pain in hips, back, ribs, arms, shoulders, or legs -pain, redness, or irritation at the injection site -redness, blistering, peeling or  loosening of the skin, including inside the mouth -rounding out of face -swelling of feet or lower legs -unusual bleeding or bruising -unusual tired or weak -wounds that  do not heal Side effects that usually do not require medical attention (report to your doctor or health care professional if they continue or are bothersome): -diarrhea or constipation -change in taste -headache -nausea, vomiting -skin problems, acne, thin and shiny skin -touble sleeping -unusual growth of hair on the face or body -weight gain This list may not describe all possible side effects. Call your doctor for medical advice about side effects. You may report side effects to FDA at 1-800-FDA-1088. Where should I keep my medicine? This drug is given in a hospital or clinic and will not be stored at home. NOTE: This sheet is a summary. It may not cover all possible information. If you have questions about this medicine, talk to your doctor, pharmacist, or health care provider.    2016, [employer]. (2007-09-25 14:04:12) Ondansetron tablets What is this medicine? ONDANSETRON (on DAN se tron) is used to treat nausea and vomiting caused by chemotherapy. It is also used to prevent or treat nausea and vomiting after surgery. This medicine may be used for other purposes; ask your health care provider or pharmacist if you have questions. What should I tell my health care provider before I take this medicine? They need to know if you have any of these conditions: -heart disease -history of irregular heartbeat -liver disease -low levels of magnesium or potassium in the blood -an unusual or allergic reaction to ondansetron, granisetron, other medicines, foods, dyes, or preservatives -pregnant or trying to get pregnant -breast-feeding How should I use this medicine? Take this medicine by mouth with a glass of water. Follow the directions on your prescription label. Take your doses at regular intervals. Do not  take your medicine more often than directed. Talk to your pediatrician regarding the use of this medicine in children. Special care may be needed. Overdosage: If you think you have taken too much of this medicine contact a poison control center or emergency room at once. NOTE: This medicine is only for you. Do not share this medicine with others. What if I miss a dose? If you miss a dose, take it as soon as you can. If it is almost time for your next dose, take only that dose. Do not take double or extra doses. What may interact with this medicine? Do not take this medicine with any of the following medications: -apomorphine -certain medicines for fungal infections like fluconazole, itraconazole, ketoconazole, posaconazole, voriconazole -cisapride -dofetilide -dronedarone -pimozide -thioridazine -ziprasidone This medicine may also interact with the following medications: -carbamazepine -certain medicines for depression, anxiety, or psychotic disturbances -fentanyl -linezolid -MAOIs like Carbex, Eldepryl, Marplan, Nardil, and Parnate -methylene blue (injected into a vein) -other medicines that prolong the QT interval (cause an abnormal heart rhythm) -phenytoin -rifampicin -tramadol This list may not describe all possible interactions. Give your health care provider a list of all the medicines, herbs, non-prescription drugs, or dietary supplements you use. Also tell them if you smoke, drink alcohol, or use illegal drugs. Some items may interact with your medicine. What should I watch for while using this medicine? Check with your doctor or health care professional right away if you have any sign of an allergic reaction. What side effects may I notice from receiving this medicine? Side effects that you should report to your doctor or health care professional as soon as possible: -allergic reactions like skin rash, itching or hives, swelling of the face, lips or tongue -breathing  problems -confusion -dizziness -fast or irregular heartbeat -feeling faint or lightheaded, falls -  fever and chills -loss of balance or coordination -seizures -sweating -swelling of the hands or feet -tightness in the chest -tremors -unusually weak or tired Side effects that usually do not require medical attention (report to your doctor or health care professional if they continue or are bothersome): -constipation or diarrhea -headache This list may not describe all possible side effects. Call your doctor for medical advice about side effects. You may report side effects to FDA at 1-800-FDA-1088. Where should I keep my medicine? Keep out of the reach of children. Store between 2 and 30 degrees C (36 and 86 degrees F). Throw away any unused medicine after the expiration date. NOTE: This sheet is a summary. It may not cover all possible information. If you have questions about this medicine, talk to your doctor, pharmacist, or health care provider.    2016, [employer]. (2013-03-11 16:27:45) Prochlorperazine tablets What is this medicine? PROCHLORPERAZINE (proe klor PER a zeen) helps to control severe nausea and vomiting. This medicine is also used to treat schizophrenia. It can also help patients who experience anxiety that is not due to psychological illness. This medicine may be used for other purposes; ask your health care provider or pharmacist if you have questions. What should I tell my health care provider before I take this medicine? They need to know if you have any of these conditions: -blood disorders or disease -dementia -liver disease or jaundice -Parkinson's disease -uncontrollable movement disorder -an unusual or allergic reaction to prochlorperazine, other medicines, foods, dyes, or preservatives -pregnant or trying to get pregnant -breast-feeding How should I use this medicine? Take this medicine by mouth with a glass of water. Follow the directions  on the prescription label. Take your doses at regular intervals. Do not take your medicine more often than directed. Do not stop taking this medicine suddenly. This can cause nausea, vomiting, and dizziness. Ask your doctor or health care professional for advice. Talk to your pediatrician regarding the use of this medicine in children. Special care may be needed. While this drug may be prescribed for children as young as 2 years for selected conditions, precautions do apply. Overdosage: If you think you have taken too much of this medicine contact a poison control center or emergency room at once. NOTE: This medicine is only for you. Do not share this medicine with others. What if I miss a dose? If you miss a dose, take it as soon as you can. If it is almost time for your next dose, take only that dose. Do not take double or extra doses. What may interact with this medicine? Do not take this medicine with any of the following medications: -amoxapine -antidepressants like citalopram, escitalopram, fluoxetine, paroxetine, and sertraline -deferoxamine -dofetilide -maprotiline -tricyclic antidepressants like amitriptyline, clomipramine, imipramine, nortiptyline and others This medicine may also interact with the following medications: -lithium -medicines for pain -phenytoin -propranolol -warfarin This list may not describe all possible interactions. Give your health care provider a list of all the medicines, herbs, non-prescription drugs, or dietary supplements you use. Also tell them if you smoke, drink alcohol, or use illegal drugs. Some items may interact with your medicine. What should I watch for while using this medicine? Visit your doctor or health care professional for regular checks on your progress. You may get drowsy or dizzy. Do not drive, use machinery, or do anything that needs mental alertness until you know how this medicine affects you. Do not stand or sit up quickly, especially  if you  are an older patient. This reduces the risk of dizzy or fainting spells. Alcohol may interfere with the effect of this medicine. Avoid alcoholic drinks. This medicine can reduce the response of your body to heat or cold. Dress warm in cold weather and stay hydrated in hot weather. If possible, avoid extreme temperatures like saunas, hot tubs, very hot or cold showers, or activities that can cause dehydration such as vigorous exercise. This medicine can make you more sensitive to the sun. Keep out of the sun. If you cannot avoid being in the sun, wear protective clothing and use sunscreen. Do not use sun lamps or tanning beds/booths. Your mouth may get dry. Chewing sugarless gum or sucking hard candy, and drinking plenty of water may help. Contact your doctor if the problem does not go away or is severe. What side effects may I notice from receiving this medicine? Side effects that you should report to your doctor or health care professional as soon as possible: -blurred vision -breast enlargement in men or women -breast milk in women who are not breast-feeding -chest pain, fast or irregular heartbeat -confusion, restlessness -dark yellow or brown urine -difficulty breathing or swallowing -dizziness or fainting spells -drooling, shaking, movement difficulty (shuffling walk) or rigidity -fever, chills, sore throat -involuntary or uncontrollable movements of the eyes, mouth, head, arms, and legs -seizures -stomach area pain -unusually weak or tired -unusual bleeding or bruising -yellowing of skin or eyes Side effects that usually do not require medical attention (report to your doctor or health care professional if they continue or are bothersome): -difficulty passing urine -difficulty sleeping -headache -sexual dysfunction -skin rash, or itching This list may not describe all possible side effects. Call your doctor for medical advice about side effects. You may report side effects to  FDA at 1-800-FDA-1088. Where should I keep my medicine? Keep out of the reach of children. Store at room temperature between 15 and 30 degrees C (59 and 86 degrees F). Protect from light. Throw away any unused medicine after the expiration date. NOTE: This sheet is a summary. It may not cover all possible information. If you have questions about this medicine, talk to your doctor, pharmacist, or health care provider.    2016, [employer]. (2011-10-23 16:59:39)

## 2015-10-28 ENCOUNTER — Encounter (HOSPITAL_COMMUNITY): Payer: BLUE CROSS/BLUE SHIELD

## 2015-10-28 NOTE — Patient Instructions (Signed)
Derek Rodriguez   CHEMOTHERAPY INSTRUCTIONS  Premeds: Aloxi - this is for nausea/vomiting prevention/reduction. Dexamethasone- steroid - given to reduce the risk of you having an allergic type reaction to the chemotherapy. Dex can cause you to feel energized, nervous/anxious/jittery, make you have trouble sleeping, and/or make you feel hot/flushed in the face/neck and/or look pink/red in the face/neck. These side effects will pass as the Dex wears off. (takes 20 minutes to infuse)  Oxaliplatin - anaphylactic reaction, neurotoxicity (i.e., headache, fatigue, difficulty sleeping, pain). Peripheral neuropathy (numbness/tingling/burning in hands/fingers/feet/toes) - will be aggravated by cold/cool temperatures. We need to know when you develop peripheral neuropathy so that we can monitor it and treat if necessary. Nausea/vomiting, diarrhea, bone marrow suppression (lowers white blood cells (fight infection), lowers red blood cells (make up your blood), lowers platelets (help blood to clot). Pulmonary fibrosis. Once you have received Oxaliplatin do NOT eat or drinking anything cold/cool for 5-10 days! Do NOT breathe in cold/cool air and do NOT touch anything cold for 5-10 days. The time frame varies from patient to patient on the length of time you must abstain from the above mentioned. Best advice is to wait at least 5 days before attempting to reintroduce cold/cool back into life. Slowly reintroduce cool/cold things! Wear gloves when getting items out of the refrigerator (of course, these would be things you are going to heat to eat)!      (takes 2 hours to infuse)    Xeloda - diarrhea, hand-foot syndrome (hands/feet can get red/tender/and skin can peel). Avoid friction and hot environments on hands/feet. Wear cotton socks. Lotion twice a day to hands/fingers/feet/toes with Udder cream. Mucositis (inflammation of any mucosal membrane can develop -this can occur in the  throat/mouth). Mouth sores, nausea/vomiting, anemia, fatigue can also develop. Take Imodium if diarrhea develops and contact us immediately - we will give you further instructions on how to take your Imodium. Xeloda usually comes with a teaching packet from the mail order/specialty pharmacy that will supply you with the drug that is really informative about diarrhea and hand-foot syndrome. No pregnant, child bearing age people, or animals should come into contact with this drug. Even though this is in pill form - it is still very powerful!!! If you should be instructed to discontinue taking this drug, bring the drug into the [facility] and we will dispose of it in a safe manner. Chemotherapy is a biohazard and must be disposed of properly. Do not touch this pill much. It would be best if the caregiver wore gloves while handling. Do not put this pill in with the rest of your pills in a pill box. Keep them in a separate pill box. You should take this medication within 30 minutes after eating your am and pm meals. No grapefruit or grapefruit juice should be consumed while on this medication.   Your dosage of Xeloda 500mg  tablets:   Take 4 tabs (2,000mg  total) by mouth in the am after a meal and in the pm after a meal. Take for 14 days in a row followed by a 7 day rest period. Repeat.   POTENTIAL SIDE EFFECTS OF TREATMENT: Increased Susceptibility to Infection, Vomiting, Constipation, Hair Thinning, Changes in Character of Skin and Nails (brittleness, dryness,etc.), Pigment Changes (darkening of nail beds, palms of hands, soles of feet, etc.), Bone Marrow Suppression, Abdominal Cramping, Nausea, Diarrhea, Sun Sensitivity and Mouth Sores    EDUCATIONAL MATERIALS GIVEN AND REVIEWED: Chemotherapy and You booklet Specific Instructions  Sheets: Oxaliplatin, Xeloda, Zofran, Compazine, Aloxi, Dexamethasone   SELF CARE ACTIVITIES WHILE ON CHEMOTHERAPY: Increase your fluid intake 48 hours prior to treatment and  drink at least 2 quarts per day after treatment., No alcohol intake., No aspirin or other medications unless approved by your oncologist., Eat foods that are light and easy to digest., No fried, fatty, or spicy foods immediately before or after treatment., Have teeth cleaned professionally before starting treatment. Keep dentures and partial plates clean., Use soft toothbrush and do not use mouthwashes that contain alcohol. Biotene is a good mouthwash that is available at most pharmacies or may be ordered by calling [phone]., Use warm salt water gargles (1 teaspoon salt per 1 quart warm water) before and after meals and at bedtime. Or you may rinse with 2 tablespoons of three -percent hydrogen peroxide mixed in eight ounces of water., Always use sunscreen with SPF (Sun Protection Factor) of 50 or higher., Use your nausea medication as directed to prevent nausea., Use your stool softener or laxative as directed to prevent constipation. and Use your anti-diarrheal medication as directed to stop diarrhea.  Please wash your hands for at least 30 seconds using warm soapy water. Handwashing is the #1 way to prevent the spread of germs. Stay away from sick people or people who are getting over a cold. If you develop respiratory systems such as green/yellow mucus production or productive cough or persistent cough let us know and we will see if you need an antibiotic. It is a good idea to keep a pair of gloves on when going into grocery stores/[employer] to decrease your risk of coming into contact with germs on the carts, etc. Carry alcohol hand gel with you at all times and use it frequently if out in public. All foods need to be cooked thoroughly. No raw foods. No medium or undercooked meats, eggs. If your food is cooked medium well, it does not need to be hot pink or saturated with bloody liquid at all. Vegetables and fruits need to be washed/rinsed under the faucet with a dish detergent before being consumed. You  can eat raw fruits and vegetables unless we tell you otherwise but it would be best if you cooked them or bought frozen. Do not eat off of salad bars or hot bars unless you really trust the cleanliness of the restaurant. If you need dental work, please let Dr. Whitney Muse know before you go for your appointment so that we can coordinate the best possible time for you in regards to your chemo regimen. You need to also let your dentist know that you are actively taking chemo. We may need to do labs prior to your dental appointment. We also want your bowels moving at least every other day. If this is not happening, we need to know so that we can get you on a bowel regimen to help you go.       MEDICATIONS: You have been given prescriptions for the following medications:  Zofran/Ondansetron 8mg  tablet. Take 1 tablet every 8 hours as needed for nausea/vomiting. (#1 nausea med to take, this can constipate)  Compazine/Prochlorperazine 10mg  tablet. Take 1 tablet every 6 hours as needed for nausea/vomiting. (#2 nausea med to take, this can make you sleepy)   EMLA cream. Apply a quarter size amount to port site 1 hour prior to chemo. Do not rub in. Cover with plastic wrap.   Over-the-Counter Meds:  Miralax 1 capful in 8 oz of fluid daily. May increase to  two times a day if needed. This is a stool softener. If this doesn't work proceed you can add:  Senokot S  - start with 1 tablet two times a day and increase to 4 tablets two times a day if needed. (total of 8 tablets in a 24 hour period). This is a stimulant laxative.   Call us if this does not help your bowels move.   Imodium 2mg  capsule. Take 2 capsules after the 1st loose stool and then 1 capsule every 2 hours until you go a total of 12 hours without having a loose stool. Call the [facility] if loose stools continue. If diarrhea occurs @ bedtime, take 2 capsules @ bedtime. Then take 2 capsules every 4 hours until morning. Call [facility].   SYMPTOMS TO REPORT AS SOON AS POSSIBLE AFTER TREATMENT:  FEVER GREATER THAN 100.5 F  CHILLS WITH OR WITHOUT FEVER  NAUSEA AND VOMITING THAT IS NOT CONTROLLED WITH YOUR NAUSEA MEDICATION  UNUSUAL SHORTNESS OF BREATH  UNUSUAL BRUISING OR BLEEDING  TENDERNESS IN MOUTH AND THROAT WITH OR WITHOUT PRESENCE OF ULCERS  URINARY PROBLEMS  BOWEL PROBLEMS  UNUSUAL RASH    Wear comfortable clothing and clothing appropriate for easy access to any Portacath or PICC line. Let us know if there is anything that we can do to make your therapy better!      I have been informed and understand all of the instructions given to me and have received a copy. I have been instructed to call the clinic [phone] or my family physician as soon as possible for continued medical care, if indicated. I do not have any more questions at this time but understand that I may call the [facility] or the Patient Navigator at [phone] during office hours should I have questions or need assistance in obtaining follow-up care.            Oxaliplatin Injection What is this medicine? OXALIPLATIN (ox AL i PLA tin) is a chemotherapy drug. It targets fast dividing cells, like cancer cells, and causes these cells to die. This medicine is used to treat cancers of the colon and rectum, and many other cancers. This medicine may be used for other purposes; ask your health care provider or pharmacist if you have questions. What should I tell my health care provider before I take this medicine? They need to know if you have any of these conditions: -kidney disease -an unusual or allergic reaction to oxaliplatin, other chemotherapy, other medicines, foods, dyes, or preservatives -pregnant or trying to get pregnant -breast-feeding How should I use this medicine? This drug is given as an infusion into a vein. It is administered in a hospital or clinic by a specially trained health care  professional. Talk to your pediatrician regarding the use of this medicine in children. Special care may be needed. Overdosage: If you think you have taken too much of this medicine contact a poison control center or emergency room at once. NOTE: This medicine is only for you. Do not share this medicine with others. What if I miss a dose? It is important not to miss a dose. Call your doctor or health care professional if you are unable to keep an appointment. What may interact with this medicine? -medicines to increase blood counts like filgrastim, pegfilgrastim, sargramostim -probenecid -some antibiotics like amikacin, gentamicin, neomycin, polymyxin B, streptomycin, tobramycin -zalcitabine Talk to your doctor or health care professional before taking any of these medicines: -acetaminophen -  aspirin -ibuprofen -ketoprofen -naproxen This list may not describe all possible interactions. Give your health care provider a list of all the medicines, herbs, non-prescription drugs, or dietary supplements you use. Also tell them if you smoke, drink alcohol, or use illegal drugs. Some items may interact with your medicine. What should I watch for while using this medicine? Your condition will be monitored carefully while you are receiving this medicine. You will need important blood work done while you are taking this medicine. This medicine can make you more sensitive to cold. Do not drink cold drinks or use ice. Cover exposed skin before coming in contact with cold temperatures or cold objects. When out in cold weather wear warm clothing and cover your mouth and nose to warm the air that goes into your lungs. Tell your doctor if you get sensitive to the cold. This drug may make you feel generally unwell. This is not uncommon, as chemotherapy can affect healthy cells as well as cancer cells. Report any side effects. Continue your course of treatment even though you feel ill unless your doctor tells you  to stop. In some cases, you may be given additional medicines to help with side effects. Follow all directions for their use. Call your doctor or health care professional for advice if you get a fever, chills or sore throat, or other symptoms of a cold or flu. Do not treat yourself. This drug decreases your body's ability to fight infections. Try to avoid being around people who are sick. This medicine may increase your risk to bruise or bleed. Call your doctor or health care professional if you notice any unusual bleeding. Be careful brushing and flossing your teeth or using a toothpick because you may get an infection or bleed more easily. If you have any dental work done, tell your dentist you are receiving this medicine. Avoid taking products that contain aspirin, acetaminophen, ibuprofen, naproxen, or ketoprofen unless instructed by your doctor. These medicines may hide a fever. Do not become pregnant while taking this medicine. Women should inform their doctor if they wish to become pregnant or think they might be pregnant. There is a potential for serious side effects to an unborn child. Talk to your health care professional or pharmacist for more information. Do not breast-feed an infant while taking this medicine. Call your doctor or health care professional if you get diarrhea. Do not treat yourself. What side effects may I notice from receiving this medicine? Side effects that you should report to your doctor or health care professional as soon as possible: -allergic reactions like skin rash, itching or hives, swelling of the face, lips, or tongue -low blood counts - This drug may decrease the number of white blood cells, red blood cells and platelets. You may be at increased risk for infections and bleeding. -signs of infection - fever or chills, cough, sore throat, pain or difficulty passing urine -signs of decreased platelets or bleeding - bruising, pinpoint red spots on the skin, black,  tarry stools, nosebleeds -signs of decreased red blood cells - unusually weak or tired, fainting spells, lightheadedness -breathing problems -chest pain, pressure -cough -diarrhea -jaw tightness -mouth sores -nausea and vomiting -pain, swelling, redness or irritation at the injection site -pain, tingling, numbness in the hands or feet -problems with balance, talking, walking -redness, blistering, peeling or loosening of the skin, including inside the mouth -trouble passing urine or change in the amount of urine Side effects that usually do not require medical attention (report  to your doctor or health care professional if they continue or are bothersome): -changes in vision -constipation -hair loss -loss of appetite -metallic taste in the mouth or changes in taste -stomach pain This list may not describe all possible side effects. Call your doctor for medical advice about side effects. You may report side effects to FDA at 1-800-FDA-1088. Where should I keep my medicine? This drug is given in a hospital or clinic and will not be stored at home. NOTE: This sheet is a summary. It may not cover all possible information. If you have questions about this medicine, talk to your doctor, pharmacist, or health care provider.    2016, Elsevier/Gold Standard. (2007-12-30 17:22:47) Capecitabine tablets What is this medicine? CAPECITABINE (ka pe SITE a been) is a chemotherapy drug. It slows the growth of cancer cells. This medicine is used to treat breast cancer, and also colon or rectal cancer. This medicine may be used for other purposes; ask your health care provider or pharmacist if you have questions. What should I tell my health care provider before I take this medicine? They need to know if you have any of these conditions: -bleeding or blood disorders -dihydropyrimidine dehydrogenase (DPD) deficiency -heart disease -infection (especially a virus infection such as chickenpox, cold  sores, or herpes) -kidney disease -liver disease -an unusual or allergic reaction to capecitabine, 5-fluorouracil, other medicines, foods, dyes, or preservatives -pregnant or trying to get pregnant -breast-feeding How should I use this medicine? Take this medicine by mouth with a glass of water, within 30 minutes of the end of a meal. Do not cut, crush or chew this medicine. Follow the directions on the prescription label. Take your medicine at regular intervals. Do not take it more often than directed. Do not stop taking except on your doctor's advice. Your doctor may want you to take a combination of 150 mg and 500 mg tablets for each dose. It is very important that you know how to correctly take your dose. Taking the wrong tablets could result in an overdose (too much medication) or underdose (too little medication). Talk to your pediatrician regarding the use of this medicine in children. Special care may be needed. Overdosage: If you think you have taken too much of this medicine contact a poison control center or emergency room at once. NOTE: This medicine is only for you. Do not share this medicine with others. What if I miss a dose? If you miss a dose, do not take the missed dose at all. Do not take double or extra doses. Instead, continue with your next scheduled dose and check with your doctor. What may interact with this medicine? -antacids with aluminum and/or magnesium -folic acid -leucovorin -medicines to increase blood counts like filgrastim, pegfilgrastim, sargramostim -phenytoin -vaccines -warfarin Talk to your doctor or health care professional before taking any of these medicines: -acetaminophen -aspirin -ibuprofen -ketoprofen -naproxen This list may not describe all possible interactions. Give your health care provider a list of all the medicines, herbs, non-prescription drugs, or dietary supplements you use. Also tell them if you smoke, drink alcohol, or use illegal  drugs. Some items may interact with your medicine. What should I watch for while using this medicine? Visit your doctor for checks on your progress. This drug may make you feel generally unwell. This is not uncommon, as chemotherapy can affect healthy cells as well as cancer cells. Report any side effects. Continue your course of treatment even though you feel ill unless your doctor tells  you to stop. In some cases, you may be given additional medicines to help with side effects. Follow all directions for their use. Call your doctor or health care professional for advice if you get a fever, chills or sore throat, or other symptoms of a cold or flu. Do not treat yourself. This drug decreases your body's ability to fight infections. Try to avoid being around people who are sick. This medicine may increase your risk to bruise or bleed. Call your doctor or health care professional if you notice any unusual bleeding. Be careful brushing and flossing your teeth or using a toothpick because you may get an infection or bleed more easily. If you have any dental work done, tell your dentist you are receiving this medicine. Avoid taking products that contain aspirin, acetaminophen, ibuprofen, naproxen, or ketoprofen unless instructed by your doctor. These medicines may hide a fever. Do not become pregnant while taking this medicine. Women should inform their doctor if they wish to become pregnant or think they might be pregnant. There is a potential for serious side effects to an unborn child. Talk to your health care professional or pharmacist for more information. Do not breast-feed an infant while taking this medicine. Men are advised not to father a child while taking this medicine. What side effects may I notice from receiving this medicine? Side effects that you should report to your doctor or health care professional as soon as possible: -allergic reactions like skin rash, itching or hives, swelling of the  face, lips, or tongue -low blood counts - this medicine may decrease the number of white blood cells, red blood cells and platelets. You may be at increased risk for infections and bleeding. -signs of infection - fever or chills, cough, sore throat, pain or difficulty passing urine -signs of decreased platelets or bleeding - bruising, pinpoint red spots on the skin, black, tarry stools, blood in the urine -signs of decreased red blood cells - unusually weak or tired, fainting spells, lightheadedness -breathing problems -changes in vision -chest pain -dark urine -diarrhea of more than 4 bowel movements in one day or any diarrhea at night; bloody or watery diarrhea -dizziness -mouth sores -nausea and vomiting -pain, tingling, numbness in the hands or feet -redness, swelling, or sores on hands or feet -stomach pain -vomiting -yellow color of skin or eyes Side effects that usually do not require medical attention (report to your doctor or health care professional if they continue or are bothersome): -constipation -diarrhea -dry or itchy skin -hair loss -loss of appetite -nausea -weak or tired This list may not describe all possible side effects. Call your doctor for medical advice about side effects. You may report side effects to FDA at 1-800-FDA-1088. Where should I keep my medicine? Keep out of the reach of children. Store at room temperature between 15 and 30 degrees C (59 and 86 degrees F). Keep container tightly closed. Throw away any unused medicine after the expiration date. NOTE: This sheet is a summary. It may not cover all possible information. If you have questions about this medicine, talk to your doctor, pharmacist, or health care provider.    2016, Elsevier/Gold Standard. (2014-07-19 17:03:30) Palonosetron Injection What is this medicine? PALONOSETRON (pal oh NOE se tron) is used to prevent nausea and vomiting caused by chemotherapy. It also helps prevent delayed nausea  and vomiting that may occur a few days after your treatment. This medicine may be used for other purposes; ask your health care provider or pharmacist  if you have questions. What should I tell my health care provider before I take this medicine? They need to know if you have any of these conditions: -an unusual or allergic reaction to palonosetron, dolasetron, granisetron, ondansetron, other medicines, foods, dyes, or preservatives -pregnant or trying to get pregnant -breast-feeding How should I use this medicine? This medicine is for infusion into a vein. It is given by a health care professional in a hospital or clinic setting. Talk to your pediatrician regarding the use of this medicine in children. While this drug may be prescribed for children as young as 1 month for selected conditions, precautions do apply. Overdosage: If you think you have taken too much of this medicine contact a poison control center or emergency room at once. NOTE: This medicine is only for you. Do not share this medicine with others. What if I miss a dose? This does not apply. What may interact with this medicine? -certain medicines for depression, anxiety, or psychotic disturbances -fentanyl -linezolid -MAOIs like Carbex, Eldepryl, Marplan, Nardil, and Parnate -methylene blue (injected into a vein) -tramadol This list may not describe all possible interactions. Give your health care provider a list of all the medicines, herbs, non-prescription drugs, or dietary supplements you use. Also tell them if you smoke, drink alcohol, or use illegal drugs. Some items may interact with your medicine. What should I watch for while using this medicine? Your condition will be monitored carefully while you are receiving this medicine. What side effects may I notice from receiving this medicine? Side effects that you should report to your doctor or health care professional as soon as possible: -allergic reactions like skin  rash, itching or hives, swelling of the face, lips, or tongue -breathing problems -confusion -dizziness -fast, irregular heartbeat -fever and chills -loss of balance or coordination -seizures -sweating -swelling of the hands and feet -tremors -unusually weak or tired Side effects that usually do not require medical attention (report to your doctor or health care professional if they continue or are bothersome): -constipation or diarrhea -headache This list may not describe all possible side effects. Call your doctor for medical advice about side effects. You may report side effects to FDA at 1-800-FDA-1088. Where should I keep my medicine? This drug is given in a hospital or clinic and will not be stored at home. NOTE: This sheet is a summary. It may not cover all possible information. If you have questions about this medicine, talk to your doctor, pharmacist, or health care provider.    2016, Elsevier/Gold Standard. (2013-04-10 10:38:36) Dexamethasone injection What is this medicine? DEXAMETHASONE (dex a METH a sone) is a corticosteroid. It is used to treat inflammation of the skin, joints, lungs, and other organs. Common conditions treated include asthma, allergies, and arthritis. It is also used for other conditions, like blood disorders and diseases of the adrenal glands. This medicine may be used for other purposes; ask your health care provider or pharmacist if you have questions. What should I tell my health care provider before I take this medicine? They need to know if you have any of these conditions: -blood clotting problems -Cushing's syndrome -diabetes -glaucoma -heart problems or disease -high blood pressure -infection like herpes, measles, tuberculosis, or chickenpox -kidney disease -liver disease -mental problems -myasthenia gravis -osteoporosis -previous heart attack -seizures -stomach, ulcer or intestine disease including colitis and  diverticulitis -thyroid problem -an unusual or allergic reaction to dexamethasone, corticosteroids, other medicines, lactose, foods, dyes, or preservatives -pregnant or trying to get  pregnant -breast-feeding How should I use this medicine? This medicine is for injection into a muscle, joint, lesion, soft tissue, or vein. It is given by a health care professional in a hospital or clinic setting. Talk to your pediatrician regarding the use of this medicine in children. Special care may be needed. Overdosage: If you think you have taken too much of this medicine contact a poison control center or emergency room at once. NOTE: This medicine is only for you. Do not share this medicine with others. What if I miss a dose? This may not apply. If you are having a series of injections over a prolonged period, try not to miss an appointment. Call your doctor or health care professional to reschedule if you are unable to keep an appointment. What may interact with this medicine? Do not take this medicine with any of the following medications: -mifepristone, RU-486 -vaccines This medicine may also interact with the following medications: -amphotericin B -antibiotics like clarithromycin, erythromycin, and troleandomycin -aspirin and aspirin-like drugs -barbiturates like phenobarbital -carbamazepine -cholestyramine -cholinesterase inhibitors like donepezil, galantamine, rivastigmine, and tacrine -cyclosporine -digoxin -diuretics -ephedrine -male hormones, like estrogens or progestins and birth control pills -indinavir -isoniazid -ketoconazole -medicines for diabetes -medicines that improve muscle tone or strength for conditions like myasthenia gravis -NSAIDs, medicines for pain and inflammation, like ibuprofen or naproxen -phenytoin -rifampin -thalidomide -warfarin This list may not describe all possible interactions. Give your health care provider a list of all the medicines, herbs,  non-prescription drugs, or dietary supplements you use. Also tell them if you smoke, drink alcohol, or use illegal drugs. Some items may interact with your medicine. What should I watch for while using this medicine? Your condition will be monitored carefully while you are receiving this medicine. If you are taking this medicine for a long time, carry an identification card with your name and address, the type and dose of your medicine, and your doctor's name and address. This medicine may increase your risk of getting an infection. Stay away from people who are sick. Tell your doctor or health care professional if you are around anyone with measles or chickenpox. Talk to your health care provider before you get any vaccines that you take this medicine. If you are going to have surgery, tell your doctor or health care professional that you have taken this medicine within the last twelve months. Ask your doctor or health care professional about your diet. You may need to lower the amount of salt you eat. The medicine can increase your blood sugar. If you are a diabetic check with your doctor if you need help adjusting the dose of your diabetic medicine. What side effects may I notice from receiving this medicine? Side effects that you should report to your doctor or health care professional as soon as possible: -allergic reactions like skin rash, itching or hives, swelling of the face, lips, or tongue -black or tarry stools -change in the amount of urine -changes in vision -confusion, excitement, restlessness, a false sense of well-being -fever, sore throat, sneezing, cough, or other signs of infection, wounds that will not heal -hallucinations -increased thirst -mental depression, mood swings, mistaken feelings of self importance or of being mistreated -pain in hips, back, ribs, arms, shoulders, or legs -pain, redness, or irritation at the injection site -redness, blistering, peeling or  loosening of the skin, including inside the mouth -rounding out of face -swelling of feet or lower legs -unusual bleeding or bruising -unusual tired or weak -wounds that  do not heal Side effects that usually do not require medical attention (report to your doctor or health care professional if they continue or are bothersome): -diarrhea or constipation -change in taste -headache -nausea, vomiting -skin problems, acne, thin and shiny skin -touble sleeping -unusual growth of hair on the face or body -weight gain This list may not describe all possible side effects. Call your doctor for medical advice about side effects. You may report side effects to FDA at 1-800-FDA-1088. Where should I keep my medicine? This drug is given in a hospital or clinic and will not be stored at home. NOTE: This sheet is a summary. It may not cover all possible information. If you have questions about this medicine, talk to your doctor, pharmacist, or health care provider.    2016, Elsevier/Gold Standard. (2007-09-25 14:04:12) Ondansetron tablets What is this medicine? ONDANSETRON (on DAN se tron) is used to treat nausea and vomiting caused by chemotherapy. It is also used to prevent or treat nausea and vomiting after surgery. This medicine may be used for other purposes; ask your health care provider or pharmacist if you have questions. What should I tell my health care provider before I take this medicine? They need to know if you have any of these conditions: -heart disease -history of irregular heartbeat -liver disease -low levels of magnesium or potassium in the blood -an unusual or allergic reaction to ondansetron, granisetron, other medicines, foods, dyes, or preservatives -pregnant or trying to get pregnant -breast-feeding How should I use this medicine? Take this medicine by mouth with a glass of water. Follow the directions on your prescription label. Take your doses at regular intervals. Do not  take your medicine more often than directed. Talk to your pediatrician regarding the use of this medicine in children. Special care may be needed. Overdosage: If you think you have taken too much of this medicine contact a poison control center or emergency room at once. NOTE: This medicine is only for you. Do not share this medicine with others. What if I miss a dose? If you miss a dose, take it as soon as you can. If it is almost time for your next dose, take only that dose. Do not take double or extra doses. What may interact with this medicine? Do not take this medicine with any of the following medications: -apomorphine -certain medicines for fungal infections like fluconazole, itraconazole, ketoconazole, posaconazole, voriconazole -cisapride -dofetilide -dronedarone -pimozide -thioridazine -ziprasidone This medicine may also interact with the following medications: -carbamazepine -certain medicines for depression, anxiety, or psychotic disturbances -fentanyl -linezolid -MAOIs like Carbex, Eldepryl, Marplan, Nardil, and Parnate -methylene blue (injected into a vein) -other medicines that prolong the QT interval (cause an abnormal heart rhythm) -phenytoin -rifampicin -tramadol This list may not describe all possible interactions. Give your health care provider a list of all the medicines, herbs, non-prescription drugs, or dietary supplements you use. Also tell them if you smoke, drink alcohol, or use illegal drugs. Some items may interact with your medicine. What should I watch for while using this medicine? Check with your doctor or health care professional right away if you have any sign of an allergic reaction. What side effects may I notice from receiving this medicine? Side effects that you should report to your doctor or health care professional as soon as possible: -allergic reactions like skin rash, itching or hives, swelling of the face, lips or tongue -breathing  problems -confusion -dizziness -fast or irregular heartbeat -feeling faint or lightheaded, falls -  fever and chills -loss of balance or coordination -seizures -sweating -swelling of the hands or feet -tightness in the chest -tremors -unusually weak or tired Side effects that usually do not require medical attention (report to your doctor or health care professional if they continue or are bothersome): -constipation or diarrhea -headache This list may not describe all possible side effects. Call your doctor for medical advice about side effects. You may report side effects to FDA at 1-800-FDA-1088. Where should I keep my medicine? Keep out of the reach of children. Store between 2 and 30 degrees C (36 and 86 degrees F). Throw away any unused medicine after the expiration date. NOTE: This sheet is a summary. It may not cover all possible information. If you have questions about this medicine, talk to your doctor, pharmacist, or health care provider.    2016, Elsevier/Gold Standard. (2013-03-11 16:27:45) Prochlorperazine tablets What is this medicine? PROCHLORPERAZINE (proe klor PER a zeen) helps to control severe nausea and vomiting. This medicine is also used to treat schizophrenia. It can also help patients who experience anxiety that is not due to psychological illness. This medicine may be used for other purposes; ask your health care provider or pharmacist if you have questions. What should I tell my health care provider before I take this medicine? They need to know if you have any of these conditions: -blood disorders or disease -dementia -liver disease or jaundice -Parkinson's disease -uncontrollable movement disorder -an unusual or allergic reaction to prochlorperazine, other medicines, foods, dyes, or preservatives -pregnant or trying to get pregnant -breast-feeding How should I use this medicine? Take this medicine by mouth with a glass of water. Follow the directions  on the prescription label. Take your doses at regular intervals. Do not take your medicine more often than directed. Do not stop taking this medicine suddenly. This can cause nausea, vomiting, and dizziness. Ask your doctor or health care professional for advice. Talk to your pediatrician regarding the use of this medicine in children. Special care may be needed. While this drug may be prescribed for children as young as 2 years for selected conditions, precautions do apply. Overdosage: If you think you have taken too much of this medicine contact a poison control center or emergency room at once. NOTE: This medicine is only for you. Do not share this medicine with others. What if I miss a dose? If you miss a dose, take it as soon as you can. If it is almost time for your next dose, take only that dose. Do not take double or extra doses. What may interact with this medicine? Do not take this medicine with any of the following medications: -amoxapine -antidepressants like citalopram, escitalopram, fluoxetine, paroxetine, and sertraline -deferoxamine -dofetilide -maprotiline -tricyclic antidepressants like amitriptyline, clomipramine, imipramine, nortiptyline and others This medicine may also interact with the following medications: -lithium -medicines for pain -phenytoin -propranolol -warfarin This list may not describe all possible interactions. Give your health care provider a list of all the medicines, herbs, non-prescription drugs, or dietary supplements you use. Also tell them if you smoke, drink alcohol, or use illegal drugs. Some items may interact with your medicine. What should I watch for while using this medicine? Visit your doctor or health care professional for regular checks on your progress. You may get drowsy or dizzy. Do not drive, use machinery, or do anything that needs mental alertness until you know how this medicine affects you. Do not stand or sit up quickly, especially  if you  are an older patient. This reduces the risk of dizzy or fainting spells. Alcohol may interfere with the effect of this medicine. Avoid alcoholic drinks. This medicine can reduce the response of your body to heat or cold. Dress warm in cold weather and stay hydrated in hot weather. If possible, avoid extreme temperatures like saunas, hot tubs, very hot or cold showers, or activities that can cause dehydration such as vigorous exercise. This medicine can make you more sensitive to the sun. Keep out of the sun. If you cannot avoid being in the sun, wear protective clothing and use sunscreen. Do not use sun lamps or tanning beds/booths. Your mouth may get dry. Chewing sugarless gum or sucking hard candy, and drinking plenty of water may help. Contact your doctor if the problem does not go away or is severe. What side effects may I notice from receiving this medicine? Side effects that you should report to your doctor or health care professional as soon as possible: -blurred vision -breast enlargement in men or women -breast milk in women who are not breast-feeding -chest pain, fast or irregular heartbeat -confusion, restlessness -dark yellow or brown urine -difficulty breathing or swallowing -dizziness or fainting spells -drooling, shaking, movement difficulty (shuffling walk) or rigidity -fever, chills, sore throat -involuntary or uncontrollable movements of the eyes, mouth, head, arms, and legs -seizures -stomach area pain -unusually weak or tired -unusual bleeding or bruising -yellowing of skin or eyes Side effects that usually do not require medical attention (report to your doctor or health care professional if they continue or are bothersome): -difficulty passing urine -difficulty sleeping -headache -sexual dysfunction -skin rash, or itching This list may not describe all possible side effects. Call your doctor for medical advice about side effects. You may report side effects to  FDA at 1-800-FDA-1088. Where should I keep my medicine? Keep out of the reach of children. Store at room temperature between 15 and 30 degrees C (59 and 86 degrees F). Protect from light. Throw away any unused medicine after the expiration date. NOTE: This sheet is a summary. It may not cover all possible information. If you have questions about this medicine, talk to your doctor, pharmacist, or health care provider.    2016, Elsevier/Gold Standard. (2011-10-23 16:59:39)

## 2015-11-01 ENCOUNTER — Other Ambulatory Visit (HOSPITAL_COMMUNITY): Payer: Self-pay | Admitting: Oncology

## 2015-11-01 ENCOUNTER — Encounter (HOSPITAL_COMMUNITY): Payer: BLUE CROSS/BLUE SHIELD

## 2015-11-02 NOTE — Progress Notes (Signed)
Chemo teaching done and consent signed for Oxaliplatin and Xeloda. Calendar given to patient. Pt has nausea meds. To return Friday for chemo.

## 2015-11-04 ENCOUNTER — Encounter (HOSPITAL_BASED_OUTPATIENT_CLINIC_OR_DEPARTMENT_OTHER): Payer: BLUE CROSS/BLUE SHIELD

## 2015-11-04 VITALS — BP 148/96 | HR 66 | Temp 98.2°F | Resp 16 | Wt 237.4 lb

## 2015-11-04 DIAGNOSIS — Z5111 Encounter for antineoplastic chemotherapy: Secondary | ICD-10-CM | POA: Diagnosis not present

## 2015-11-04 DIAGNOSIS — C187 Malignant neoplasm of sigmoid colon: Secondary | ICD-10-CM

## 2015-11-04 LAB — CBC WITH DIFFERENTIAL/PLATELET
Basophils Absolute: 0 10*3/uL (ref 0.0–0.1)
Basophils Relative: 0 %
Eosinophils Absolute: 0.2 10*3/uL (ref 0.0–0.7)
Eosinophils Relative: 3 %
HEMATOCRIT: 40.1 % (ref 39.0–52.0)
HEMOGLOBIN: 13.2 g/dL (ref 13.0–17.0)
LYMPHS ABS: 1.6 10*3/uL (ref 0.7–4.0)
LYMPHS PCT: 19 %
MCH: 26 pg (ref 26.0–34.0)
MCHC: 32.9 g/dL (ref 30.0–36.0)
MCV: 79.1 fL (ref 78.0–100.0)
Monocytes Absolute: 0.8 10*3/uL (ref 0.1–1.0)
Monocytes Relative: 9 %
NEUTROS PCT: 69 %
Neutro Abs: 5.8 10*3/uL (ref 1.7–7.7)
Platelets: 217 10*3/uL (ref 150–400)
RBC: 5.07 MIL/uL (ref 4.22–5.81)
RDW: 15.7 % — ABNORMAL HIGH (ref 11.5–15.5)
WBC: 8.4 10*3/uL (ref 4.0–10.5)

## 2015-11-04 LAB — COMPREHENSIVE METABOLIC PANEL
ALT: 17 U/L (ref 17–63)
AST: 17 U/L (ref 15–41)
Albumin: 3.7 g/dL (ref 3.5–5.0)
Alkaline Phosphatase: 67 U/L (ref 38–126)
Anion gap: 8 (ref 5–15)
BUN: 13 mg/dL (ref 6–20)
CALCIUM: 8.9 mg/dL (ref 8.9–10.3)
CO2: 22 mmol/L (ref 22–32)
CREATININE: 0.87 mg/dL (ref 0.61–1.24)
Chloride: 104 mmol/L (ref 101–111)
Glucose, Bld: 152 mg/dL — ABNORMAL HIGH (ref 65–99)
Potassium: 3.6 mmol/L (ref 3.5–5.1)
Sodium: 134 mmol/L — ABNORMAL LOW (ref 135–145)
Total Bilirubin: 0.6 mg/dL (ref 0.3–1.2)
Total Protein: 7.1 g/dL (ref 6.5–8.1)

## 2015-11-04 MED ORDER — DEXTROSE 5 % IV SOLN
Freq: Once | INTRAVENOUS | Status: AC
  Administered 2015-11-04: 14:00:00 via INTRAVENOUS

## 2015-11-04 MED ORDER — PALONOSETRON HCL INJECTION 0.25 MG/5ML
0.2500 mg | Freq: Once | INTRAVENOUS | Status: AC
  Administered 2015-11-04: 0.25 mg via INTRAVENOUS
  Filled 2015-11-04: qty 5

## 2015-11-04 MED ORDER — OXALIPLATIN CHEMO INJECTION 100 MG/20ML
130.0000 mg/m2 | Freq: Once | INTRAVENOUS | Status: AC
  Administered 2015-11-04: 300 mg via INTRAVENOUS
  Filled 2015-11-04: qty 60

## 2015-11-04 MED ORDER — DEXAMETHASONE SODIUM PHOSPHATE 100 MG/10ML IJ SOLN
10.0000 mg | Freq: Once | INTRAMUSCULAR | Status: AC
  Administered 2015-11-04: 10 mg via INTRAVENOUS
  Filled 2015-11-04: qty 1

## 2015-11-04 NOTE — Patient Instructions (Signed)
[facility] Discharge Instructions for Patients Receiving Chemotherapy   Beginning January 23rd 2017 lab work for the [facility] will be done in the  Main lab at [facility] on 1st floor. If you have a lab appointment with the [facility] please come in thru the  Main Entrance and check in at the main information desk   Today you received the following chemotherapy agent: Oxalipatin.   Oxaliplatin Injection What is this medicine? OXALIPLATIN (ox AL i PLA tin) is a chemotherapy drug. It targets fast dividing cells, like cancer cells, and causes these cells to die. This medicine is used to treat cancers of the colon and rectum, and many other cancers. This medicine may be used for other purposes; ask your health care provider or pharmacist if you have questions. What should I tell my health care provider before I take this medicine? They need to know if you have any of these conditions: -kidney disease -an unusual or allergic reaction to oxaliplatin, other chemotherapy, other medicines, foods, dyes, or preservatives  How should I use this medicine? This drug is given as an infusion into a vein. It is administered in a hospital or clinic by a specially trained health care professional. Talk to your pediatrician regarding the use of this medicine in children. Special care may be needed. Overdosage: If you think you have taken too much of this medicine contact a poison control center or emergency room at once. NOTE: This medicine is only for you. Do not share this medicine with others. What if I miss a dose? It is important not to miss a dose. Call your doctor or health care professional if you are unable to keep an appointment. What may interact with this medicine? -medicines to increase blood counts like filgrastim, pegfilgrastim, sargramostim -probenecid -some antibiotics like amikacin, gentamicin, neomycin, polymyxin B, streptomycin, tobramycin -zalcitabine Talk to  your doctor or health care professional before taking any of these medicines: -acetaminophen -aspirin -ibuprofen -ketoprofen -naproxen This list may not describe all possible interactions. Give your health care provider a list of all the medicines, herbs, non-prescription drugs, or dietary supplements you use. Also tell them if you smoke, drink alcohol, or use illegal drugs. Some items may interact with your medicine. What should I watch for while using this medicine? Your condition will be monitored carefully while you are receiving this medicine. You will need important blood work done while you are taking this medicine. This medicine can make you more sensitive to cold. Do not drink cold drinks or use ice. Cover exposed skin before coming in contact with cold temperatures or cold objects. When out in cold weather wear warm clothing and cover your mouth and nose to warm the air that goes into your lungs. Tell your doctor if you get sensitive to the cold. This drug may make you feel generally unwell. This is not uncommon, as chemotherapy can affect healthy cells as well as cancer cells. Report any side effects. Continue your course of treatment even though you feel ill unless your doctor tells you to stop. In some cases, you may be given additional medicines to help with side effects. Follow all directions for their use. Call your doctor or health care professional for advice if you get a fever, chills or sore throat, or other symptoms of a cold or flu. Do not treat yourself. This drug decreases your body's ability to fight infections. Try to avoid being around people who are sick. This medicine may increase  your risk to bruise or bleed. Call your doctor or health care professional if you notice any unusual bleeding. Be careful brushing and flossing your teeth or using a toothpick because you may get an infection or bleed more easily. If you have any dental work done, tell your dentist you are  receiving this medicine. Avoid taking products that contain aspirin, acetaminophen, ibuprofen, naproxen, or ketoprofen unless instructed by your doctor. These medicines may hide a fever. Do not become pregnant while taking this medicine. Women should inform their doctor if they wish to become pregnant or think they might be pregnant. There is a potential for serious side effects to an unborn child. Talk to your health care professional or pharmacist for more information. Do not breast-feed an infant while taking this medicine. Call your doctor or health care professional if you get diarrhea. Do not treat yourself. What side effects may I notice from receiving this medicine? Side effects that you should report to your doctor or health care professional as soon as possible: -allergic reactions like skin rash, itching or hives, swelling of the face, lips, or tongue -low blood counts - This drug may decrease the number of white blood cells, red blood cells and platelets. You may be at increased risk for infections and bleeding. -signs of infection - fever or chills, cough, sore throat, pain or difficulty passing urine -signs of decreased platelets or bleeding - bruising, pinpoint red spots on the skin, black, tarry stools, nosebleeds -signs of decreased red blood cells - unusually weak or tired, fainting spells, lightheadedness -breathing problems -chest pain, pressure -cough -diarrhea -jaw tightness -mouth sores -nausea and vomiting -pain, swelling, redness or irritation at the injection site -pain, tingling, numbness in the hands or feet -problems with balance, talking, walking -redness, blistering, peeling or loosening of the skin, including inside the mouth -trouble passing urine or change in the amount of urine Side effects that usually do not require medical attention (report to your doctor or health care professional if they continue or are bothersome): -changes in  vision -constipation -hair loss -loss of appetite -metallic taste in the mouth or changes in taste -stomach pain This list may not describe all possible side effects. Call your doctor for medical advice about side effects. You may report side effects to FDA at 1-800-FDA-1088. Where should I keep my medicine? This drug is given in a hospital or clinic and will not be stored at home. NOTE: This sheet is a summary. It may not cover all possible information. If you have questions about this medicine, talk to your doctor, pharmacist, or health care provider.    2016, [employer]vier/[employer] Standard. (2007-12-30 17:22:47)    If you develop nausea and vomiting, or diarrhea that is not controlled by your medication, call the clinic.  The clinic phone number is [phone]. Office hours are Monday-Friday 8:30am-5:00pm.  BELOW ARE SYMPTOMS THAT SHOULD BE REPORTED IMMEDIATELY:  *FEVER GREATER THAN 101.0 F  *CHILLS WITH OR WITHOUT FEVER  NAUSEA AND VOMITING THAT IS NOT CONTROLLED WITH YOUR NAUSEA MEDICATION  *UNUSUAL SHORTNESS OF BREATH  *UNUSUAL BRUISING OR BLEEDING  TENDERNESS IN MOUTH AND THROAT WITH OR WITHOUT PRESENCE OF ULCERS  *URINARY PROBLEMS  *BOWEL PROBLEMS  UNUSUAL RASH Items with * indicate a potential emergency and should be followed up as soon as possible. If you have an emergency after office hours please contact your primary care physician or go to the nearest emergency department.  Please call the clinic during office hours if  you have any questions or concerns.   You may also contact the Patient Navigator at [phone] should you have any questions or need assistance in obtaining follow up care.      Resources For Cancer Patients and their Caregivers ? American Cancer Society: Can assist with transportation, wigs, general needs, runs Look Good Feel Better.        418-021-7473 ? Cancer Care: Provides financial assistance, online support groups,  medication/co-pay assistance.  1-800-813-HOPE 6844241750) ? [employer] Assists [place] cancer patients and their families through emotional , educational and financial support.  (854) 830-1261 ? [place] DSS Where to apply for food stamps, Medicaid and utility assistance. 779-699-6089 ? [facility]: Transportation to medical appointments. (307) 025-7867 ? Social Security Administration: May apply for disability if have a Stage IV cancer. 7172100554 (337)391-9553 ? [employer], Disability and Transit Services: Assists with nutrition, care and transit needs. 276-693-5236

## 2015-11-05 ENCOUNTER — Telehealth (HOSPITAL_COMMUNITY): Payer: Self-pay | Admitting: *Deleted

## 2015-11-05 NOTE — Telephone Encounter (Signed)
24h call back: Patient had some indigestion in the middle of the night but got through it. He did have some nausea and had to take a nausea pill this morning. Feet felt the cold sensation of the floor this morning and had to put his socks on. Patient instructed that he could take some over the counter Prilosec if needed. He is currently taking Zantac for his stomach acid. Pt states that he is just getting used to the "cold" thing.

## 2015-11-08 ENCOUNTER — Ambulatory Visit (HOSPITAL_COMMUNITY): Payer: BLUE CROSS/BLUE SHIELD | Admitting: Oncology

## 2015-11-08 NOTE — Progress Notes (Signed)
NO SHOW  Jerzee Jerome, PA-C 11/08/2015 10:20 AM

## 2015-11-09 ENCOUNTER — Encounter (HOSPITAL_COMMUNITY): Payer: Self-pay | Admitting: Emergency Medicine

## 2015-11-09 ENCOUNTER — Emergency Department (HOSPITAL_COMMUNITY): Payer: BLUE CROSS/BLUE SHIELD

## 2015-11-09 ENCOUNTER — Emergency Department (HOSPITAL_COMMUNITY)
Admission: EM | Admit: 2015-11-09 | Discharge: 2015-11-09 | Disposition: A | Discharge status: Home / Self Care | Payer: BLUE CROSS/BLUE SHIELD | Attending: Emergency Medicine | Admitting: Emergency Medicine

## 2015-11-09 DIAGNOSIS — R110 Nausea: Secondary | ICD-10-CM | POA: Diagnosis present

## 2015-11-09 DIAGNOSIS — M1990 Unspecified osteoarthritis, unspecified site: Secondary | ICD-10-CM | POA: Diagnosis not present

## 2015-11-09 DIAGNOSIS — Z87891 Personal history of nicotine dependence: Secondary | ICD-10-CM | POA: Insufficient documentation

## 2015-11-09 DIAGNOSIS — I10 Essential (primary) hypertension: Secondary | ICD-10-CM | POA: Insufficient documentation

## 2015-11-09 LAB — COMPREHENSIVE METABOLIC PANEL
ALT: 17 U/L (ref 17–63)
AST: 18 U/L (ref 15–41)
Albumin: 4.2 g/dL (ref 3.5–5.0)
Alkaline Phosphatase: 78 U/L (ref 38–126)
Anion gap: 9 (ref 5–15)
BUN: 24 mg/dL — AB (ref 6–20)
CHLORIDE: 102 mmol/L (ref 101–111)
CO2: 23 mmol/L (ref 22–32)
CREATININE: 1.2 mg/dL (ref 0.61–1.24)
Calcium: 9.7 mg/dL (ref 8.9–10.3)
GFR calc non Af Amer: >60 mL/min (ref >60)
Glucose, Bld: 122 mg/dL — ABNORMAL HIGH (ref 65–99)
Potassium: 4.1 mmol/L (ref 3.5–5.1)
SODIUM: 134 mmol/L — AB (ref 135–145)
Total Bilirubin: 0.9 mg/dL (ref 0.3–1.2)
Total Protein: 8.1 g/dL (ref 6.5–8.1)

## 2015-11-09 LAB — URINALYSIS, ROUTINE W REFLEX MICROSCOPIC
BILIRUBIN URINE: NEGATIVE
GLUCOSE, UA: NEGATIVE mg/dL
HGB URINE DIPSTICK: NEGATIVE
KETONES UR: NEGATIVE mg/dL
Leukocytes, UA: NEGATIVE
Nitrite: NEGATIVE
PH: 5.5 (ref 5.0–8.0)
Protein, ur: NEGATIVE mg/dL
Specific Gravity, Urine: >1.03 — ABNORMAL HIGH (ref 1.005–1.030)

## 2015-11-09 LAB — CBC WITH DIFFERENTIAL/PLATELET
Basophils Absolute: 0 10*3/uL (ref 0.0–0.1)
Basophils Relative: 0 %
EOS PCT: 2 %
Eosinophils Absolute: 0.2 10*3/uL (ref 0.0–0.7)
HEMATOCRIT: 44.3 % (ref 39.0–52.0)
Hemoglobin: 15 g/dL (ref 13.0–17.0)
LYMPHS PCT: 22 %
Lymphs Abs: 2.5 10*3/uL (ref 0.7–4.0)
MCH: 26.3 pg (ref 26.0–34.0)
MCHC: 33.9 g/dL (ref 30.0–36.0)
MCV: 77.7 fL — AB (ref 78.0–100.0)
MONO ABS: 1.4 10*3/uL — AB (ref 0.1–1.0)
MONOS PCT: 12 %
Neutro Abs: 7.2 10*3/uL (ref 1.7–7.7)
Neutrophils Relative %: 64 %
Platelets: 310 10*3/uL (ref 150–400)
RBC: 5.7 MIL/uL (ref 4.22–5.81)
RDW: 15 % (ref 11.5–15.5)
WBC: 11.3 10*3/uL — AB (ref 4.0–10.5)

## 2015-11-09 LAB — LIPASE, BLOOD: Lipase: 41 U/L (ref 11–51)

## 2015-11-09 MED ORDER — ONDANSETRON 4 MG PO TBDP: 4.0000 mg | ORAL_TABLET | Freq: Three times a day (TID) | ORAL | Status: DC | PRN

## 2015-11-09 MED ORDER — ONDANSETRON HCL 4 MG/2ML IJ SOLN
4.0000 mg | INTRAMUSCULAR | Status: DC | PRN
  Administered 2015-11-09: 4 mg via INTRAVENOUS
  Filled 2015-11-09: qty 2

## 2015-11-09 MED ORDER — PROMETHAZINE HCL 25 MG RE SUPP: 25.0000 mg | Freq: Four times a day (QID) | RECTAL | Status: DC | PRN

## 2015-11-09 MED ORDER — SODIUM CHLORIDE 0.9 % IV SOLN
INTRAVENOUS | Status: DC
  Administered 2015-11-09: 20:00:00 via INTRAVENOUS

## 2015-11-09 MED ORDER — SODIUM CHLORIDE 0.9 % IV BOLUS (SEPSIS)
500.0000 mL | Freq: Once | INTRAVENOUS | Status: AC
  Administered 2015-11-09: 500 mL via INTRAVENOUS

## 2015-11-09 NOTE — ED Provider Notes (Signed)
CSN: [id number]     Arrival date & time 11/09/15  1820 History   First MD Initiated Contact with Patient 11/09/15 1845     Chief Complaint  Patient presents with  . Nausea    HPI Pt was seen at 1850. Per pt, c/o gradual onset and persistence of constant nausea and poor PO intake that began 3 days ago. States he had his first dose of chemotherapy 5 days ago. Pt has been taking zofran and compazine without improvement of his symptoms. Denies vomiting/diarrhea, no abd pain, no CP/SOB, no back pain, no fevers, no black or blood in stools.     Past Medical History  Diagnosis Date  . Diverticulitis   . Hypertension   . Arthritis   . 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 Partial colectomy N/A 10/03/2015    Procedure: PARTIAL COLECTOMY;  Surgeon: Aviva Signs, MD;  Location: [facility];  Service: General;  Laterality: N/A;   Family History  Problem Relation Age of Onset  . Diabetes Father    Social History  Substance Use Topics  . Smoking status: Former Smoker    Types: Cigars, Pipe    Quit date: 09/26/1985  . Smokeless tobacco: Former Systems developer    Types: Snuff    Quit date: 06/29/2015  . Alcohol Use: No     Comment: occ    Review of Systems ROS: Statement: All systems negative except as marked or noted in the HPI; Constitutional: Negative for fever and chills. ; ; Eyes: Negative for eye pain, redness and discharge. ; ; ENMT: Negative for ear pain, hoarseness, nasal congestion, sinus pressure and sore throat. ; ; Cardiovascular: Negative for chest pain, palpitations, diaphoresis, dyspnea and peripheral edema. ; ; Respiratory: Negative for cough, wheezing and stridor. ; ; Gastrointestinal: +nausea, decreased PO intake. Negative for vomiting, diarrhea, abdominal pain, blood in stool, hematemesis, jaundice and rectal bleeding. . ; ; Genitourinary: Negative for dysuria, flank pain and hematuria. ; ; Musculoskeletal: Negative for back  pain and neck pain. Negative for swelling and trauma.; ; Skin: Negative for pruritus, rash, abrasions, blisters, bruising and skin lesion.; ; Neuro: Negative for headache, lightheadedness and neck stiffness. Negative for weakness, altered level of consciousness, altered mental status, extremity weakness, paresthesias, involuntary movement, seizure and syncope.      Allergies  Review of patient's allergies indicates no known allergies.  Home Medications   Prior to Admission medications   Medication Sig Start Date End Date Taking? Authorizing Provider  capecitabine (XELODA) 500 MG tablet Take 4 tabs (2,000mg  total) by mouth in the am after a meal and in the pm after a meal. Take for 14 days in a row followed by a 7 day rest period. 10/20/15   Baird Cancer, PA-C  lisinopril (PRINIVIL,ZESTRIL) 10 MG tablet Take 10 mg by mouth daily.    Historical Provider, MD  ondansetron (ZOFRAN) 4 MG tablet Take 1 tablet (4 mg total) by mouth every 8 (eight) hours as needed for nausea or vomiting. Patient not taking: Reported on 10/18/2015 10/07/15   Aviva Signs, MD  ondansetron [facility]) 8 MG tablet Take 1 tablet (8 mg total) by mouth every 8 (eight) hours as needed for nausea or vomiting. 10/27/15   Patrici Ranks, MD  OXALIPLATIN IV Inject into the vein. (chemo) To be given once every 21 days    Historical Provider, MD  oxyCODONE-acetaminophen (PERCOCET) 7.5-325 MG tablet Take 1-2 tablets by mouth every  4 (four) hours as needed. 10/07/15   Aviva Signs, MD  prochlorperazine (COMPAZINE) 10 MG tablet Take 1 tablet (10 mg total) by mouth every 6 (six) hours as needed for nausea or vomiting. 10/27/15   Patrici Ranks, MD   BP 123/86 mmHg  Pulse 96  Temp(Src) 98.3 F (36.8 C) (Oral)  Resp 20  Ht 6' (1.829 m)  Wt 235 lb (106.595 kg)  BMI 31.86 kg/m2  SpO2 99%   19:49 Orthostatic Vital Signs KP  Orthostatic Lying  - BP- Lying: 117/78 mmHg ; Pulse- Lying: 87  Orthostatic Sitting - BP- Sitting: 127/84 mmHg  ; Pulse- Sitting: 90  Orthostatic Standing at 0 minutes - BP- Standing at 0 minutes: 105/76 mmHg ; Pulse- Standing at 0 minutes: 95     Patient Vitals for the past 24 hrs:  BP Temp Temp src Pulse Resp SpO2 Height Weight  11/09/15 2043 117/78 mmHg - - 87 20 98 % - -  11/09/15 1825 123/86 mmHg 98.3 F (36.8 C) Oral 96 20 99 % 6' (1.829 m) 235 lb (106.595 kg)     Physical Exam 1855: Physical examination:  Nursing notes reviewed; Vital signs and O2 SAT reviewed;  Constitutional: Well developed, Well nourished, Well hydrated, In no acute distress; Head:  Normocephalic, atraumatic; Eyes: EOMI, PERRL, No scleral icterus; ENMT: Mouth and pharynx normal, Mucous membranes moist; Neck: Supple, Full range of motion, No lymphadenopathy; Cardiovascular: Regular rate and rhythm, No gallop; Respiratory: Breath sounds clear & equal bilaterally, No wheezes.  Speaking full sentences with ease, Normal respiratory effort/excursion; Chest: Nontender, Movement normal; Abdomen: Soft, Nontender, Nondistended, Normal bowel sounds; Genitourinary: No CVA tenderness; Extremities: Pulses normal, No tenderness, No edema, No calf edema or asymmetry.; Neuro: AA&Ox3, Major CN grossly intact.  Speech clear. No gross focal motor or sensory deficits in extremities.; Skin: Color normal, Warm, Dry.   ED Course  Procedures (including critical care time) Labs Review   Imaging Review  I have personally reviewed and evaluated these images and lab results as part of my medical decision-making.   EKG Interpretation None      MDM  MDM Reviewed: previous chart, nursing note and vitals Reviewed previous: labs Interpretation: labs and x-ray     Results for orders placed or performed during the hospital encounter of 11/09/15  Comprehensive metabolic panel  Result Value Ref Range   Sodium 134 (L) 135 - 145 mmol/L   Potassium 4.1 3.5 - 5.1 mmol/L   Chloride 102 101 - 111 mmol/L   CO2 23 22 - 32 mmol/L   Glucose, Bld 122  (H) 65 - 99 mg/dL   BUN 24 (H) 6 - 20 mg/dL   Creatinine, Ser 1.20 0.61 - 1.24 mg/dL   Calcium 9.7 8.9 - 10.3 mg/dL   Total Protein 8.1 6.5 - 8.1 g/dL   Albumin 4.2 3.5 - 5.0 g/dL   AST 18 15 - 41 U/L   ALT 17 17 - 63 U/L   Alkaline Phosphatase 78 38 - 126 U/L   Total Bilirubin 0.9 0.3 - 1.2 mg/dL   GFR calc non Af Amer >60 >60 mL/min   GFR calc Af Amer >60 >60 mL/min   Anion gap 9 5 - 15  Lipase, blood  Result Value Ref Range   Lipase 41 11 - 51 U/L  CBC with Differential  Result Value Ref Range   WBC 11.3 (H) 4.0 - 10.5 K/uL   RBC 5.70 4.22 - 5.81 MIL/uL   Hemoglobin 15.0 13.0 -  17.0 g/dL   HCT 44.3 39.0 - 52.0 %   MCV 77.7 (L) 78.0 - 100.0 fL   MCH 26.3 26.0 - 34.0 pg   MCHC 33.9 30.0 - 36.0 g/dL   RDW 15.0 11.5 - 15.5 %   Platelets 310 150 - 400 K/uL   Neutrophils Relative % 64 %   Neutro Abs 7.2 1.7 - 7.7 K/uL   Lymphocytes Relative 22 %   Lymphs Abs 2.5 0.7 - 4.0 K/uL   Monocytes Relative 12 %   Monocytes Absolute 1.4 (H) 0.1 - 1.0 K/uL   Eosinophils Relative 2 %   Eosinophils Absolute 0.2 0.0 - 0.7 K/uL   Basophils Relative 0 %   Basophils Absolute 0.0 0.0 - 0.1 K/uL  Urinalysis, Routine w reflex microscopic  Result Value Ref Range   Color, Urine YELLOW YELLOW   APPearance CLEAR CLEAR   Specific Gravity, Urine >1.030 (H) 1.005 - 1.030   pH 5.5 5.0 - 8.0   Glucose, UA NEGATIVE NEGATIVE mg/dL   Hgb urine dipstick NEGATIVE NEGATIVE   Bilirubin Urine NEGATIVE NEGATIVE   Ketones, ur NEGATIVE NEGATIVE mg/dL   Protein, ur NEGATIVE NEGATIVE mg/dL   Nitrite NEGATIVE NEGATIVE   Leukocytes, UA NEGATIVE NEGATIVE   Dg Abd Acute W/chest 11/09/2015  CLINICAL DATA:  Nausea and weakness after chemotherapy treatment. EXAM: DG ABDOMEN ACUTE W/ 1V CHEST COMPARISON:  None. FINDINGS: There is no evidence of dilated bowel loops or free intraperitoneal air. No radiopaque calculi or other significant radiographic abnormality is seen. Heart size and mediastinal contours are within  normal limits. Both lungs are clear. IMPRESSION: Negative abdominal radiographs.  No acute cardiopulmonary disease. Electronically Signed   By: Abelardo Diesel M.D.   On: 11/09/2015 20:18    2150:  Pt without orthostatic symptoms during VS. IVF and IV anti-emetic given. Abd remains benign, VSS. Pt has tol PO well while in the ED without N/V.  No stooling while in the ED.  Feels better and wants to go home now. No clear indication for admission at this time. Encouraged to eat small/frequent meals and room temp fluids; verb understanding. Dx and testing d/w pt and family.  Questions answered.  Verb understanding, agreeable to d/c home with outpt f/u.     Francine Graven, DO 11/12/15 2001

## 2015-11-09 NOTE — ED Notes (Signed)
Patient tolerated po fluids without any difficulties.

## 2015-11-09 NOTE — ED Notes (Signed)
Pt received initial chemo treatment on Friday for colon cancer. Pt c/o nausea and inability to eat/drink since Monday.

## 2015-11-09 NOTE — Discharge Instructions (Signed)
Take the prescriptions as directed.  Increase your fluid intake (ie:  Gatoraide) for the next few days, as discussed.  Eat a bland diet and advance to your regular diet slowly as you can tolerate it.  Call your Oncologist tomorrow to schedule a follow up appointment in the next 2 days.  Return to the Emergency Department immediately if not improving (or even worsening) despite taking the medicines as prescribed, any black or bloody stool or vomit, if you develop a fever over "101," or for any other concerns.

## 2015-11-11 ENCOUNTER — Encounter (HOSPITAL_COMMUNITY): Payer: Self-pay | Admitting: Hematology & Oncology

## 2015-11-11 ENCOUNTER — Encounter (HOSPITAL_BASED_OUTPATIENT_CLINIC_OR_DEPARTMENT_OTHER): Payer: BLUE CROSS/BLUE SHIELD | Admitting: Hematology & Oncology

## 2015-11-11 ENCOUNTER — Ambulatory Visit (HOSPITAL_COMMUNITY): Payer: BLUE CROSS/BLUE SHIELD | Admitting: Hematology & Oncology

## 2015-11-11 VITALS — BP 127/83 | HR 94 | Temp 98.3°F | Resp 16 | Wt 229.5 lb

## 2015-11-11 DIAGNOSIS — C779 Secondary and unspecified malignant neoplasm of lymph node, unspecified: Secondary | ICD-10-CM

## 2015-11-11 DIAGNOSIS — C187 Malignant neoplasm of sigmoid colon: Secondary | ICD-10-CM

## 2015-11-11 MED ORDER — LORAZEPAM 0.5 MG PO TABS: 0.5000 mg | ORAL_TABLET | Freq: Four times a day (QID) | ORAL | Status: DC | PRN

## 2015-11-11 NOTE — Patient Instructions (Addendum)
[place] at [facility] Discharge Instructions  RECOMMENDATIONS MADE BY THE CONSULTANT AND ANY TEST RESULTS WILL BE SENT TO YOUR REFERRING PHYSICIAN.  Please call [facility] @ [phone]. This is Amy the scheduler's number - please ask her to let me know that you are calling.   FOLFOX would be the name of your new treatment regimen. You would come once every 14 days. You wear the pump for 46 hours. You would start chemo on Friday and have the pump removed on Sunday.   Your next scheduled treatment would start June 9.    Thank you for choosing [place] at [facility] to provide your oncology and hematology care.  To afford each patient quality time with our provider, please arrive at least 15 minutes before your scheduled appointment time.   Beginning January 23rd 2017 lab work for the [facility] will be done in the  Main lab at [facility] on 1st floor. If you have a lab appointment with the [facility] please come in thru the  Main Entrance and check in at the main information desk  You need to re-schedule your appointment should you arrive 10 or more minutes late.  We strive to give you quality time with our providers, and arriving late affects you and other patients whose appointments are after yours.  Also, if you no show three or more times for appointments you may be dismissed from the clinic at the providers discretion.     Again, thank you for choosing [facility].  Our hope is that these requests will decrease the amount of time that you wait before being seen by our physicians.       _____________________________________________________________  Should you have questions after your visit to [facility], please contact our office at [phone] between the hours of 8:30 a.m. and 4:30 p.m.  Voicemails left after 4:30 p.m. will not be returned until the following business day.  For prescription refill  requests, have your pharmacy contact our office.         Resources For Cancer Patients and their Caregivers ? American Cancer Society: Can assist with transportation, wigs, general needs, runs Look Good Feel Better.        (815) 571-4482 ? Cancer Care: Provides financial assistance, online support groups, medication/co-pay assistance.  1-800-813-HOPE 517-595-3745) ? [place] Assists [place] cancer patients and their families through emotional , educational and financial support.  (848) 453-1545 ? [place] DSS Where to apply for food stamps, Medicaid and utility assistance. 330 235 8418 ? [facility]: Transportation to medical appointments. (220) 715-4685 ? Social Security Administration: May apply for disability if have a Stage IV cancer. 386-608-0488 205-191-4164 ? [employer], Disability and Transit Services: Assists with nutrition, care and transit needs. [place] Support Programs: @10RELATIVEDAYS @ > Cancer Support Group  2nd Tuesday of the month 1pm-2pm, Journey Room  > Creative Journey  3rd Tuesday of the month 1130am-1pm, Journey Room  > Look Good Feel Better  1st Wednesday of the month 10am-12 noon, Journey Room (Call [place] to register [phone])

## 2015-11-11 NOTE — Progress Notes (Signed)
[facility] Hematology/Oncology Consultation   Name: Derek Rodriguez      MRN: [id number]     Date: 11/15/2015 Time:5:36 PM   REFERRING PHYSICIAN:  Aviva Signs, MD (Gen Surg)  REASON FOR CONSULT:  Adenocarcinoma of sigmoid colon.   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  HISTORY OF PRESENT ILLNESS:   Derek Rodriguez is a 55 year old white American man with a past medical history significant for diverticulitis and HTN who recently was diagnosed with a Stage IIIC ([id number]) adenocarcinoma of sigmoid colon cancer, S/P partial colectomy by Dr. Arnoldo Morale.    Adenocarcinoma of sigmoid colon ([place])   09/09/2015 - 09/12/2015 Hospital Admission Diverticulitis of colon with perforation   09/12/2015 Imaging CT abd/pelvis- Continued severe inflammation of the proximal half of the sigmoid colon. There is a 2.6 cm oval low-attenuation structure extending between the posterior wall of the inflamed sigmoid colon and the dome of the bladder....   09/27/2015 Tumor Marker CEA: 2.5    10/03/2015 Procedure Partial colectomy, splenic flexure takedown by Dr. Arnoldo Morale   10/03/2015 Pathology Results FULL THICKNESS INVASIVE MODERATELY TO POORLY DIFFERENTIATED ADENOCARCINOMA WITH SURFACE ULCERATION. TUMOR INVADES THROUGH MUSCULARIS MUCOSA AND PERFORATES SEROSAL SURFACE. - TUMOR MEASURES 10.3 CM IN GREATEST DIMENSION. - EXTENSIVE LYMPH/VASCULAR INVASI   10/13/2015 Pathology Results MSI STABLE   10/20/2015 Imaging CT chest- Negative. No evidence of metastatic disease or other active disease within the thorax.   11/04/2015 - 11/04/2015 Chemotherapy CapeOX   11/12/2015 Miscellaneous ED visit with nausea and decreased po intake   Derek Rodriguez is accompanied by his wife.  While discussing switching chemotherapy to FOLFOX, the patient states "What if I'm just done?". I reviewed and went over the pathology report with the patient to explain how important continued treatment is with the high risk  and how extensive his disease is. He says, "I cannot be that sick every week"  He isapprehensive, noting "I ain't gonna be able to go to work with the bag on". He is also apprehensive about having a port placed.   This past week was rough, and "I can't go through another week like this". He quit "taking those pills" Monday. The first day he got sick was Monday. He did not come in as he thought he would get better. He went to the ED on Wednesday night.  His wife states he only has 2 weeks left of FMLA. This is concerning as he says, "I know for a fact I cannot go to work with the bag on".   Currently he denies fever, nausea, vomiting, diarrhea, mouth sores or constipation.   Review of Systems  Constitutional: Positive for weight loss (11 lbs weight loss since 10/18/15). Negative for fever, chills, malaise/fatigue and diaphoresis.  HENT: Negative.   Eyes: Negative.   Respiratory: Negative.   Cardiovascular: Negative.   Gastrointestinal: Negative.   Genitourinary: Negative.   Musculoskeletal: Negative.   Skin: Negative.   Neurological: Negative.  Negative for weakness.  Endo/Heme/Allergies: Negative.   Psychiatric/Behavioral: Negative.   14 point review of systems was performed and is negative except as detailed under history of present illness and above   PAST MEDICAL HISTORY:   Past Medical History  Diagnosis Date  . Diverticulitis   . Hypertension   . Arthritis   . Adenocarcinoma of sigmoid colon ([place]) 10/18/2015    ALLERGIES: No Known Allergies    MEDICATIONS: I have reviewed the patient's current medications.  Current Outpatient Prescriptions on File Prior to Visit  Medication Sig Dispense Refill  . capecitabine (XELODA) 500 MG tablet Take 4 tabs (2,032m total) by mouth in the am after a meal and in the pm after a meal. Take for 14 days in a row followed by a 7 day rest period. (Patient not taking: Reported on 11/11/2015) 112 tablet 1  . ibuprofen (ADVIL,MOTRIN) 200 MG tablet  Take 400 mg by mouth every 6 (six) hours as needed for mild pain or moderate pain.    .Marland Kitchenlisinopril (PRINIVIL,ZESTRIL) 10 MG tablet Take 10 mg by mouth daily.    . ondansetron (ZOFRAN ODT) 4 MG disintegrating tablet Take 1 tablet (4 mg total) by mouth every 8 (eight) hours as needed for nausea or vomiting. (Patient not taking: Reported on 11/11/2015) 6 tablet 0  . ondansetron (ZOFRAN) 4 MG tablet Take 1 tablet (4 mg total) by mouth every 8 (eight) hours as needed for nausea or vomiting. (Patient not taking: Reported on 11/09/2015) 20 tablet 0  . ondansetron (ZOFRAN) 8 MG tablet Take 1 tablet (8 mg total) by mouth every 8 (eight) hours as needed for nausea or vomiting. 30 tablet 2  . OXALIPLATIN IV Inject into the vein. (chemo) To be given once every 21 days    . oxyCODONE-acetaminophen (PERCOCET) 7.5-325 MG tablet Take 1-2 tablets by mouth every 4 (four) hours as needed. (Patient not taking: Reported on 11/11/2015) 60 tablet 0  . Probiotic Product (PLondon CAPS Take 1 capsule by mouth daily.    . prochlorperazine (COMPAZINE) 10 MG tablet Take 1 tablet (10 mg total) by mouth every 6 (six) hours as needed for nausea or vomiting. 30 tablet 2  . promethazine (PHENERGAN) 25 MG suppository Place 1 suppository (25 mg total) rectally every 6 (six) hours as needed for nausea or vomiting. 6 each 0  . ranitidine (ZANTAC) 150 MG tablet Take 150 mg by mouth daily as needed for heartburn.     No current facility-administered medications on file prior to visit.     PAST SURGICAL HISTORY Past Surgical History  Procedure Laterality Date  . Partial colectomy N/A 10/03/2015    Procedure: PARTIAL COLECTOMY;  Surgeon: MAviva Signs MD;  Location: [facility];  Service: General;  Laterality: N/A;    FAMILY HISTORY: Family History  Problem Relation Age of Onset  . Diabetes Father    No known family history of cancer Mother alive at age 56 healthy Father deceased at age of 780from heart disease,  complications of DM 166son 56years old- healthy 134grandson 56year old- healthy  SOCIAL HISTORY:  reports that he quit smoking about 30 years ago. His smoking use included Cigars and Pipe. He quit smokeless tobacco use about 4 months ago. His smokeless tobacco use included Snuff. He reports that he does not drink alcohol or use illicit drugs.  He works at [employer]  He is married x 37 years.  He is [employer].  PERFORMANCE STATUS: The patient's performance status is 0 - Asymptomatic  PHYSICAL EXAM: Most Recent Vital Signs: Blood pressure 127/83, pulse 94, temperature 98.3 F (36.8 C), resp. rate 16, weight 229 lb 8 oz (104.101 kg), SpO2 99 %. General appearance: alert, cooperative, appears stated age, no distress, mildly obese and accompanied by his wife. Head: Normocephalic, without obvious abnormality, atraumatic Eyes: negative findings: lids and lashes normal, conjunctivae and sclerae normal and corneas clear Throat: normal findings: lips normal without lesions, buccal mucosa normal, tongue midline and  normal, soft palate, uvula, and tonsils normal and oropharynx pink & moist without lesions or evidence of thrush and abnormal findings: dentition: poor Neck: no adenopathy, supple, symmetrical, trachea midline and thyroid not enlarged, symmetric, no tenderness/mass/nodules Lungs: clear to auscultation bilaterally and normal percussion bilaterally Heart: regular rate and rhythm, S1, S2 normal, no murmur, click, rub or gallop Abdomen: soft, non-tender; bowel sounds normal; no masses,  no organomegaly and low-midline surgical site is well healed Extremities: extremities normal, atraumatic, no cyanosis or edema Skin: Skin color, texture, turgor normal. No rashes or lesions Neurologic: Alert and oriented X 3, normal strength and tone. Normal symmetric reflexes. Normal coordination and gait  LABORATORY DATA:  I have reviewed the data as listed. CBC    Component Value Date/Time   WBC 11.3*  11/09/2015 1908   RBC 5.70 11/09/2015 1908   HGB 15.0 11/09/2015 1908   HCT 44.3 11/09/2015 1908   PLT 310 11/09/2015 1908   MCV 77.7* 11/09/2015 1908   MCH 26.3 11/09/2015 1908   MCHC 33.9 11/09/2015 1908   RDW 15.0 11/09/2015 1908   LYMPHSABS 2.5 11/09/2015 1908   MONOABS 1.4* 11/09/2015 1908   EOSABS 0.2 11/09/2015 1908   BASOSABS 0.0 11/09/2015 1908      Chemistry      Component Value Date/Time   NA 134* 11/09/2015 1908   K 4.1 11/09/2015 1908   CL 102 11/09/2015 1908   CO2 23 11/09/2015 1908   BUN 24* 11/09/2015 1908   CREATININE 1.20 11/09/2015 1908      Component Value Date/Time   CALCIUM 9.7 11/09/2015 1908   ALKPHOS 78 11/09/2015 1908   AST 18 11/09/2015 1908   ALT 17 11/09/2015 1908   BILITOT 0.9 11/09/2015 1908     Lab Results  Component Value Date   CEA 2.5 09/27/2015     RADIOGRAPHY: I have personally reviewed the radiological images as listed and agreed with the findings in the report.  Study Result     CLINICAL DATA: Nausea and weakness after chemotherapy treatment.  EXAM: DG ABDOMEN ACUTE W/ 1V CHEST  COMPARISON: None.  FINDINGS: There is no evidence of dilated bowel loops or free intraperitoneal air. No radiopaque calculi or other significant radiographic abnormality is seen. Heart size and mediastinal contours are within normal limits. Both lungs are clear.  IMPRESSION: Negative abdominal radiographs. No acute cardiopulmonary disease.   Electronically Signed  By: Abelardo Diesel M.D.  On: 11/09/2015 20:18    Study Result     CLINICAL DATA: Newly diagnosed sigmoid colon adenocarcinoma. Staging.  EXAM: CT CHEST WITH CONTRAST  TECHNIQUE: Multidetector CT imaging of the chest was performed during intravenous contrast administration.  CONTRAST: 88m ISOVUE-300 IOPAMIDOL (ISOVUE-300) INJECTION 61%  COMPARISON: None.  FINDINGS: Mediastinum/Lymph Nodes: No masses, pathologically enlarged lymph nodes, or  other significant abnormality.  Lungs/Pleura: No pulmonary mass, infiltrate, or effusion.  Upper abdomen: No acute findings.  Musculoskeletal: No chest wall mass or suspicious bone lesions identified.  IMPRESSION: Negative. No evidence of metastatic disease or other active disease within the thorax.   Electronically Signed  By: JEarle GellM.D.  On: 10/20/2015 18:07     PATHOLOGY:    Diagnosis Colon, segmental resection, sigmoid - FULL THICKNESS INVASIVE MODERATELY TO POORLY DIFFERENTIATED ADENOCARCINOMA WITH SURFACE ULCERATION. 1 of 4 FINAL for MNewcastle Scott ([phone] Diagnosis(continued) - TUMOR INVADES THROUGH MUSCULARIS MUCOSA AND PERFORATES SEROSAL SURFACE. - TUMOR MEASURES 10.3 CM IN GREATEST DIMENSION. - EXTENSIVE LYMPH/VASCULAR INVASION IS IDENTIFIED. - DISTAL MARGIN DEMONSTRATES ADENOCARCINOMA INVOLVING  THE LYMPH/VASCULAR SPACES. - PROXIMAL MARGIN IS NEGATIVE. - ELEVEN OF TWELVE LYMPH NODES ARE POSITIVE FOR METASTATIC ADENOCARCINOMA (11/12). - EXTRACAPSULAR EXTENSION IS PRESENT. 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  Unfortunately he is at high risk for recurrence and I strongly emphasized the need for adjuvant therapy. I would also recommend potential referral near the completion of therapy for HIPEC evaluation at [facility]. He is certainly an appropriate candidate for therapy.  NCCN guidelines were reviewed in detail with the patient and his wife.    I  again reminded him that when we discussed chemotherapy, I explained that patients typically tolerate FOLFOX better. I have strongly encouraged him to consider additional therapy given his pathology. I reviewed his path report again with the patient.  He unfortunately used a lot of his FMLA after his surgery and will only have 2 weeks left to use during chemotherapy. I had encouraged him to save as much FMLA as he could to use during chemotherapy.   The patient continues to be apprehensive about switching to FOLFOX and having a port placed.   The patient spoke with Hildred Alamin, our patient navigator, today. He will contact her with a decision on how he would like to proceed with treatment. Based on what he decides, we will set up a follow up appointment.   All questions were answered. The patient knows to call the clinic with any problems, questions or concerns. We can certainly see the patient much sooner if necessary.  This document serves as a record of services personally performed by Ancil Linsey, MD. It was created on her behalf by Arlyce Harman, a trained medical scribe. The creation of this record is based on the scribe's personal observations and the provider's statements to them. This document has been checked and approved by the attending provider.  I have reviewed the above documentation for accuracy and completeness, and I agree with the above.  This note is electronically signed by: Molli Hazard, MD  11/15/2015 5:36 PM

## 2015-11-15 ENCOUNTER — Other Ambulatory Visit (HOSPITAL_COMMUNITY): Payer: Self-pay | Admitting: *Deleted

## 2015-11-15 ENCOUNTER — Encounter (HOSPITAL_COMMUNITY): Payer: Self-pay | Admitting: Hematology & Oncology

## 2015-11-15 DIAGNOSIS — C187 Malignant neoplasm of sigmoid colon: Secondary | ICD-10-CM

## 2015-11-15 DIAGNOSIS — G4700 Insomnia, unspecified: Secondary | ICD-10-CM

## 2015-11-15 MED ORDER — LIDOCAINE-PRILOCAINE 2.5-2.5 % EX CREA: TOPICAL_CREAM | CUTANEOUS | Status: AC

## 2015-11-15 MED ORDER — ZOLPIDEM TARTRATE 10 MG PO TABS: 10.0000 mg | ORAL_TABLET | Freq: Every evening | ORAL | Status: DC | PRN

## 2015-11-16 ENCOUNTER — Encounter (HOSPITAL_COMMUNITY): Payer: Self-pay

## 2015-11-16 ENCOUNTER — Ambulatory Visit: Payer: BLUE CROSS/BLUE SHIELD | Admitting: Gastroenterology

## 2015-11-16 NOTE — H&P (Signed)
Derek Rodriguez is an 55 y.o. male.   Chief Complaint: Colon carcinoma, need for central venous access HPI: Patient is a 55 year old white male who recently underwent a partial colectomy for sigmoid colon carcinoma who now presents for a Port-A-Cath insertion as he is about to undergo chemotherapy.  Past Medical History  Diagnosis Date  . Diverticulitis   . Hypertension   . Arthritis   . Adenocarcinoma of sigmoid colon ([place]) 10/18/2015    Past Surgical History  Procedure Laterality Date  . Partial colectomy N/A 10/03/2015    Procedure: PARTIAL COLECTOMY;  Surgeon: Aviva Signs, MD;  Location: [facility];  Service: General;  Laterality: N/A;    Family History  Problem Relation Age of Onset  . Diabetes Father    Social History:  reports that he quit smoking about 30 years ago. His smoking use included Cigars and Pipe. He quit smokeless tobacco use about 4 months ago. His smokeless tobacco use included Snuff. He reports that he does not drink alcohol or use illicit drugs.  Allergies: No Known Allergies  No prescriptions prior to admission    No results found for this or any previous visit (from the past 48 hour(s)). No results found.  Review of Systems  Constitutional: Positive for malaise/fatigue.  HENT: Negative.   Eyes: Negative.   Respiratory: Negative.   Cardiovascular: Negative.   Gastrointestinal: Negative.   Genitourinary: Negative.   Musculoskeletal: Positive for myalgias.  Skin: Negative.   Psychiatric/Behavioral: Negative.     There were no vitals taken for this visit. Physical Exam  Vitals reviewed. Constitutional: He is oriented to person, place, and time. He appears well-developed and well-nourished.  HENT:  Head: Normocephalic and atraumatic.  Neck: Normal range of motion. Neck supple.  Cardiovascular: Normal rate, regular rhythm and normal heart sounds.   Respiratory: Effort normal and breath sounds normal.  GI: Soft. Bowel sounds are normal. He exhibits no  distension. There is no tenderness. There is no rebound.  Well-healed midline surgical scar present.  Musculoskeletal: Normal range of motion.  Neurological: He is alert and oriented to person, place, and time.  Skin: Skin is warm and dry.     Assessment/Plan Impression: Colon carcinoma, need for central venous access for chemotherapy Plan: Patient will undergo Port-A-Cath insertion on 11/18/2015. The risks and benefits of the procedure including bleeding, infection, and pneumothorax were fully explained to the patient, who gave informed consent.  [name], MD 11/16/2015, 8:46 AM

## 2015-11-17 ENCOUNTER — Encounter (HOSPITAL_COMMUNITY)
Admission: RE | Admit: 2015-11-17 | Discharge: 2015-11-17 | Disposition: A | Discharge status: Home / Self Care | Payer: BLUE CROSS/BLUE SHIELD | Source: Ambulatory Visit | Attending: General Surgery | Admitting: General Surgery

## 2015-11-17 ENCOUNTER — Inpatient Hospital Stay (HOSPITAL_COMMUNITY): Admission: RE | Admit: 2015-11-17 | Payer: BLUE CROSS/BLUE SHIELD | Source: Ambulatory Visit

## 2015-11-17 ENCOUNTER — Other Ambulatory Visit (HOSPITAL_COMMUNITY): Payer: BLUE CROSS/BLUE SHIELD

## 2015-11-18 ENCOUNTER — Ambulatory Visit (HOSPITAL_COMMUNITY): Payer: BLUE CROSS/BLUE SHIELD | Admitting: Anesthesiology

## 2015-11-18 ENCOUNTER — Ambulatory Visit (HOSPITAL_COMMUNITY): Payer: BLUE CROSS/BLUE SHIELD

## 2015-11-18 ENCOUNTER — Inpatient Hospital Stay (HOSPITAL_COMMUNITY): Payer: BLUE CROSS/BLUE SHIELD

## 2015-11-18 ENCOUNTER — Encounter (HOSPITAL_COMMUNITY)
Admission: RE | Disposition: A | Discharge status: Home / Self Care | Payer: Self-pay | Source: Ambulatory Visit | Attending: General Surgery

## 2015-11-18 ENCOUNTER — Encounter (HOSPITAL_COMMUNITY): Payer: Self-pay | Admitting: *Deleted

## 2015-11-18 ENCOUNTER — Ambulatory Visit (HOSPITAL_COMMUNITY)
Admission: RE | Admit: 2015-11-18 | Discharge: 2015-11-18 | Disposition: A | Discharge status: Home / Self Care | Payer: BLUE CROSS/BLUE SHIELD | Source: Ambulatory Visit | Attending: General Surgery | Admitting: General Surgery

## 2015-11-18 DIAGNOSIS — I10 Essential (primary) hypertension: Secondary | ICD-10-CM | POA: Insufficient documentation

## 2015-11-18 DIAGNOSIS — Z9049 Acquired absence of other specified parts of digestive tract: Secondary | ICD-10-CM | POA: Diagnosis not present

## 2015-11-18 DIAGNOSIS — M1990 Unspecified osteoarthritis, unspecified site: Secondary | ICD-10-CM | POA: Diagnosis not present

## 2015-11-18 DIAGNOSIS — C189 Malignant neoplasm of colon, unspecified: Secondary | ICD-10-CM | POA: Diagnosis present

## 2015-11-18 DIAGNOSIS — Z87891 Personal history of nicotine dependence: Secondary | ICD-10-CM | POA: Diagnosis not present

## 2015-11-18 DIAGNOSIS — Z95828 Presence of other vascular implants and grafts: Secondary | ICD-10-CM

## 2015-11-18 HISTORY — PX: PORTACATH PLACEMENT: SHX2246

## 2015-11-18 SURGERY — INSERTION, TUNNELED CENTRAL VENOUS DEVICE, WITH PORT
Anesthesia: Monitor Anesthesia Care | Site: Chest

## 2015-11-18 MED ORDER — FENTANYL CITRATE (PF) 100 MCG/2ML IJ SOLN
INTRAMUSCULAR | Status: AC
  Filled 2015-11-18: qty 2

## 2015-11-18 MED ORDER — PROPOFOL 10 MG/ML IV BOLUS
INTRAVENOUS | Status: AC
  Filled 2015-11-18: qty 40

## 2015-11-18 MED ORDER — KETOROLAC TROMETHAMINE 30 MG/ML IJ SOLN
30.0000 mg | Freq: Once | INTRAMUSCULAR | Status: AC
  Administered 2015-11-18: 30 mg via INTRAVENOUS

## 2015-11-18 MED ORDER — LIDOCAINE HCL (PF) 1 % IJ SOLN
INTRAMUSCULAR | Status: AC
  Filled 2015-11-18: qty 5

## 2015-11-18 MED ORDER — FENTANYL CITRATE (PF) 100 MCG/2ML IJ SOLN
25.0000 ug | INTRAMUSCULAR | Status: AC
  Administered 2015-11-18 (×2): 25 ug via INTRAVENOUS

## 2015-11-18 MED ORDER — LIDOCAINE HCL (CARDIAC) 10 MG/ML IV SOLN
INTRAVENOUS | Status: DC | PRN
  Administered 2015-11-18: 50 mg via INTRAVENOUS

## 2015-11-18 MED ORDER — CEFAZOLIN SODIUM-DEXTROSE 2-4 GM/100ML-% IV SOLN
INTRAVENOUS | Status: AC
  Filled 2015-11-18: qty 100

## 2015-11-18 MED ORDER — LIDOCAINE HCL (PF) 1 % IJ SOLN
INTRAMUSCULAR | Status: AC
  Filled 2015-11-18: qty 30

## 2015-11-18 MED ORDER — LACTATED RINGERS IV SOLN
INTRAVENOUS | Status: DC
  Administered 2015-11-18: 1000 mL via INTRAVENOUS

## 2015-11-18 MED ORDER — HEPARIN SOD (PORK) LOCK FLUSH 100 UNIT/ML IV SOLN
INTRAVENOUS | Status: DC | PRN
  Administered 2015-11-18: 500 [IU]

## 2015-11-18 MED ORDER — KETOROLAC TROMETHAMINE 30 MG/ML IJ SOLN
INTRAMUSCULAR | Status: AC
  Filled 2015-11-18: qty 1

## 2015-11-18 MED ORDER — PROPOFOL 10 MG/ML IV BOLUS
INTRAVENOUS | Status: DC | PRN
  Administered 2015-11-18: 10.4 mg via INTRAVENOUS
  Administered 2015-11-18: 21 mg via INTRAVENOUS

## 2015-11-18 MED ORDER — ONDANSETRON HCL 4 MG/2ML IJ SOLN: 4.0000 mg | Freq: Once | INTRAMUSCULAR | Status: DC | PRN

## 2015-11-18 MED ORDER — MIDAZOLAM HCL 2 MG/2ML IJ SOLN
INTRAMUSCULAR | Status: AC
  Filled 2015-11-18: qty 2

## 2015-11-18 MED ORDER — MIDAZOLAM HCL 2 MG/2ML IJ SOLN
1.0000 mg | INTRAMUSCULAR | Status: DC | PRN
  Administered 2015-11-18: 2 mg via INTRAVENOUS

## 2015-11-18 MED ORDER — FENTANYL CITRATE (PF) 100 MCG/2ML IJ SOLN: 25.0000 ug | INTRAMUSCULAR | Status: DC | PRN

## 2015-11-18 MED ORDER — LIDOCAINE HCL (PF) 1 % IJ SOLN
INTRAMUSCULAR | Status: DC | PRN
  Administered 2015-11-18: 7 mL

## 2015-11-18 MED ORDER — OXYCODONE-ACETAMINOPHEN 7.5-325 MG PO TABS: 1.0000 | ORAL_TABLET | ORAL | Status: DC | PRN

## 2015-11-18 MED ORDER — CHLORHEXIDINE GLUCONATE 4 % EX LIQD: 1.0000 "application " | Freq: Once | CUTANEOUS | Status: DC

## 2015-11-18 MED ORDER — HEPARIN SOD (PORK) LOCK FLUSH 100 UNIT/ML IV SOLN
INTRAVENOUS | Status: AC
  Filled 2015-11-18: qty 5

## 2015-11-18 MED ORDER — FENTANYL CITRATE (PF) 100 MCG/2ML IJ SOLN
INTRAMUSCULAR | Status: DC | PRN
  Administered 2015-11-18: 25 ug via INTRAVENOUS

## 2015-11-18 MED ORDER — PROPOFOL 500 MG/50ML IV EMUL
INTRAVENOUS | Status: DC | PRN
  Administered 2015-11-18: 75 ug/kg/min via INTRAVENOUS

## 2015-11-18 MED ORDER — CEFAZOLIN SODIUM-DEXTROSE 2-3 GM-% IV SOLR
INTRAVENOUS | Status: DC | PRN
  Administered 2015-11-18: 2 g via INTRAVENOUS

## 2015-11-18 MED ORDER — SODIUM CHLORIDE 0.9 % IV SOLN
INTRAVENOUS | Status: DC | PRN
  Administered 2015-11-18: 5 mL

## 2015-11-18 SURGICAL SUPPLY — 29 items
BAG DECANTER FOR FLEXI CONT (MISCELLANEOUS) ×3 IMPLANT
BAG HAMPER (MISCELLANEOUS) ×3 IMPLANT
CHLORAPREP W/TINT 10.5 ML (MISCELLANEOUS) ×3 IMPLANT
CLOTH BEACON ORANGE TIMEOUT ST (SAFETY) ×3 IMPLANT
COVER LIGHT HANDLE STERIS (MISCELLANEOUS) ×6 IMPLANT
DECANTER SPIKE VIAL GLASS SM (MISCELLANEOUS) ×3 IMPLANT
DERMABOND ADVANCED (GAUZE/BANDAGES/DRESSINGS) ×2
DERMABOND ADVANCED .7 DNX12 (GAUZE/BANDAGES/DRESSINGS) ×1 IMPLANT
DRAPE C-ARM FOLDED MOBILE STRL (DRAPES) ×3 IMPLANT
ELECT REM PT RETURN 9FT ADLT (ELECTROSURGICAL) ×3
ELECTRODE REM PT RTRN 9FT ADLT (ELECTROSURGICAL) ×1 IMPLANT
GLOVE BIOGEL PI IND STRL 7.0 (GLOVE) ×1 IMPLANT
GLOVE BIOGEL PI INDICATOR 7.0 (GLOVE) ×2
GLOVE SURG SS PI 7.5 STRL IVOR (GLOVE) ×3 IMPLANT
GOWN STRL REUS W/TWL LRG LVL3 (GOWN DISPOSABLE) ×6 IMPLANT
IV NS 500ML (IV SOLUTION) ×2
IV NS 500ML BAXH (IV SOLUTION) ×1 IMPLANT
KIT PORT POWER 8FR ISP MRI (Port) ×3 IMPLANT
KIT ROOM TURNOVER APOR (KITS) ×3 IMPLANT
MANIFOLD NEPTUNE II (INSTRUMENTS) ×3 IMPLANT
NEEDLE HYPO 25X1 1.5 SAFETY (NEEDLE) ×3 IMPLANT
PACK MINOR (CUSTOM PROCEDURE TRAY) ×3 IMPLANT
PAD ARMBOARD 7.5X6 YLW CONV (MISCELLANEOUS) ×3 IMPLANT
SET BASIN LINEN APH (SET/KITS/TRAYS/PACK) ×3 IMPLANT
SUT VIC AB 3-0 SH 27 (SUTURE) ×2
SUT VIC AB 3-0 SH 27X BRD (SUTURE) ×1 IMPLANT
SUT VIC AB 4-0 PS2 27 (SUTURE) ×3 IMPLANT
SYR 20CC LL (SYRINGE) ×3 IMPLANT
SYR CONTROL 10ML LL (SYRINGE) ×3 IMPLANT

## 2015-11-18 NOTE — Interval H&P Note (Signed)
History and Physical Interval Note:  11/18/2015 8:37 AM  Derek Rodriguez  has presented today for surgery, with the diagnosis of colon cancer  The various methods of treatment have been discussed with the patient and family. After consideration of risks, benefits and other options for treatment, the patient has consented to  Procedure(s): INSERTION PORT-A-CATH (N/A) as a surgical intervention .  The patient's history has been reviewed, patient examined, no change in status, stable for surgery.  I have reviewed the patient's chart and labs.  Questions were answered to the patient's satisfaction.     Aviva Signs A

## 2015-11-18 NOTE — Anesthesia Postprocedure Evaluation (Signed)
Anesthesia Post Note  Patient: Derek Rodriguez  Procedure(s) Performed: Procedure(s) (LRB): INSERTION PORT-A-CATH LEFT SUBCLAVIAN (N/A)  Patient location during evaluation: Short Stay Anesthesia Type: MAC Level of consciousness: awake and alert Pain management: pain level controlled Vital Signs Assessment: post-procedure vital signs reviewed and stable Respiratory status: spontaneous breathing Cardiovascular status: blood pressure returned to baseline Anesthetic complications: no    Last Vitals:  Filed Vitals:   11/18/15 0946 11/18/15 1005  BP: 115/77 115/87  Pulse: 51 55  Temp:  36.5 C  Resp: 12 16    Last Pain: There were no vitals filed for this visit.               Drucie Opitz

## 2015-11-18 NOTE — Anesthesia Preprocedure Evaluation (Signed)
Anesthesia Evaluation  Patient identified by MRN, date of birth, ID band Patient awake    Reviewed: Allergy & Precautions, NPO status , Patient's Chart, lab work & pertinent test results  Airway Mallampati: II  TM Distance: >3 FB     Dental  (+) Teeth Intact, Dental Advisory Given   Pulmonary former smoker,           Cardiovascular hypertension, Pt. on medications  Rhythm:Regular Rate:Normal     Neuro/Psych    GI/Hepatic negative GI ROS,   Endo/Other    Renal/GU      Musculoskeletal   Abdominal   Peds  Hematology   Anesthesia Other Findings   Reproductive/Obstetrics                             Anesthesia Physical Anesthesia Plan  ASA: II  Anesthesia Plan: MAC   Post-op Pain Management:    Induction: Intravenous  Airway Management Planned: Simple Face Mask  Additional Equipment:   Intra-op Plan:   Post-operative Plan:   Informed Consent: I have reviewed the patients History and Physical, chart, labs and discussed the procedure including the risks, benefits and alternatives for the proposed anesthesia with the patient or authorized representative who has indicated his/her understanding and acceptance.     Plan Discussed with:   Anesthesia Plan Comments:         Anesthesia Quick Evaluation

## 2015-11-18 NOTE — Op Note (Signed)
Patient:  Derek Rodriguez  DOB:  August 01, 1960  MRN:  [id number]   Preop Diagnosis:  Colon carcinoma, need for central venous access  Postop Diagnosis:  Same  Procedure:  Port-A-Cath insertion  Surgeon:  Aviva Signs, M.D.  Anes:  Mac  Indications:  Patient is a 55 year old white male who was recently diagnosed with colon carcinoma who is about to undergo chemotherapy and needs central venous access. The risks and benefits of the procedure including bleeding, infection, and the possibility of pneumothorax were fully explained to the patient, who gave informed consent.  Procedure note:  The patient was placed the supine position. After monitored anesthesia care was given, the left upper chest was prepped and draped using the usual sterile technique with DuraPrep. Surgical site confirmation was performed. 1% Xylocaine was used for local anesthesia.  A transverse incision was made below the left clavicle. A subcutaneous pocket was then formed. A needle is advanced into the left subclavian vein using the Seldinger technique without difficulty. A guidewire was then advanced into the right atrium on the fluoroscopic guidance. An introducer peel-away sheath were placed over the guidewire. The catheter was then inserted. Through the peel-away sheath the peel-away sheath was removed. The catheter was then attached to the port and the port placed in subcutaneous pocket. Adequate positioning was confirmed by fluoroscopy. Good backflow blood was noted in the port. The port was flushed with heparin flush. Subcutaneous layer was reapproximated using a 3-0 Vicryl interrupted suture. The skin was closed using a 4-0 Vicryl subcuticular suture. Dermabond was applied.  All tape and needle counts were correct at the end of the procedure. Patient was transferred to PACU in stable condition. A chest x-ray will be performed at that time.  Complications:  None  EBL:  Minimal  Specimen:  None

## 2015-11-18 NOTE — Anesthesia Procedure Notes (Signed)
Procedure Name: MAC Date/Time: 11/18/2015 8:41 AM Performed by: Vista Deck Pre-anesthesia Checklist: Patient identified, Emergency Drugs available, Suction available, Timeout performed and Patient being monitored Patient Re-evaluated:Patient Re-evaluated prior to inductionOxygen Delivery Method: Non-rebreather mask

## 2015-11-18 NOTE — Discharge Instructions (Signed)
Implanted Port Insertion, Care After °Refer to this sheet in the next few weeks. These instructions provide you with information on caring for yourself after your procedure. Your health care provider may also give you more specific instructions. Your treatment has been planned according to current medical practices, but problems sometimes occur. Call your health care provider if you have any problems or questions after your procedure. °WHAT TO EXPECT AFTER THE PROCEDURE °After your procedure, it is typical to have the following:  °· Discomfort at the port insertion site. Ice packs to the area will help. °· Bruising on the skin over the port. This will subside in 3-4 days. °HOME CARE INSTRUCTIONS °· After your port is placed, you will get a manufacturer's information card. The card has information about your port. Keep this card with you at all times.   °· Know what kind of port you have. There are many types of ports available.   °· Wear a medical alert bracelet in case of an emergency. This can help alert health care workers that you have a port.   °· The port can stay in for as long as your health care provider believes it is necessary.   °· A home health care nurse may give medicines and take care of the port.   °· You or a family member can get special training and directions for giving medicine and taking care of the port at home.   °SEEK MEDICAL CARE IF:  °· Your port does not flush or you are unable to get a blood return.   °· You have a fever or chills. °SEEK IMMEDIATE MEDICAL CARE IF: °· You have new fluid or pus coming from your incision.   °· You notice a bad smell coming from your incision site.   °· You have swelling, pain, or more redness at the incision or port site.   °· You have chest pain or shortness of breath. °  °This information is not intended to replace advice given to you by your health care provider. Make sure you discuss any questions you have with your health care provider. °  °Document  Released: 03/25/2013 Document Revised: 06/09/2013 Document Reviewed: 03/25/2013 °Elsevier Interactive Patient Education ©2016 [employer] °Implanted Port Home Guide °An implanted port is a type of central line that is placed under the skin. Central lines are used to provide IV access when treatment or nutrition needs to be given through a person's veins. Implanted ports are used for long-term IV access. An implanted port may be placed because:  °· You need IV medicine that would be irritating to the small veins in your hands or arms.   °· You need long-term IV medicines, such as antibiotics.   °· You need IV nutrition for a long period.   °· You need frequent blood draws for lab tests.   °· You need dialysis.   °Implanted ports are usually placed in the chest area, but they can also be placed in the upper arm, the abdomen, or the leg. An implanted port has two main parts:  °· Reservoir. The reservoir is round and will appear as a small, raised area under your skin. The reservoir is the part where a needle is inserted to give medicines or draw blood.   °· Catheter. The catheter is a thin, flexible tube that extends from the reservoir. The catheter is placed into a large vein. Medicine that is inserted into the reservoir goes into the catheter and then into the vein.   °HOW WILL I CARE FOR MY INCISION SITE? °Do not get the   incision site wet. Bathe or shower as directed by your health care provider.  °HOW IS MY PORT ACCESSED? °Special steps must be taken to access the port:  °· Before the port is accessed, a numbing cream can be placed on the skin. This helps numb the skin over the port site.   °· Your health care provider uses a sterile technique to access the port. °· Your health care provider must put on a mask and sterile gloves. °· The skin over your port is cleaned carefully with an antiseptic and allowed to dry. °· The port is gently pinched between sterile gloves, and a needle is inserted into the  port. °· Only "non-coring" port needles should be used to access the port. Once the port is accessed, a blood return should be checked. This helps ensure that the port is in the vein and is not clogged.   °· If your port needs to remain accessed for a constant infusion, a clear (transparent) bandage will be placed over the needle site. The bandage and needle will need to be changed every week, or as directed by your health care provider.   °· Keep the bandage covering the needle clean and dry. Do not get it wet. Follow your health care provider's instructions on how to take a shower or bath while the port is accessed.   °· If your port does not need to stay accessed, no bandage is needed over the port.   °WHAT IS FLUSHING? °Flushing helps keep the port from getting clogged. Follow your health care provider's instructions on how and when to flush the port. Ports are usually flushed with saline solution or a medicine called heparin. The need for flushing will depend on how the port is used.  °· If the port is used for intermittent medicines or blood draws, the port will need to be flushed:   °· After medicines have been given.   °· After blood has been drawn.   °· As part of routine maintenance.   °· If a constant infusion is running, the port may not need to be flushed.   °HOW LONG WILL MY PORT STAY IMPLANTED? °The port can stay in for as long as your health care provider thinks it is needed. When it is time for the port to come out, surgery will be done to remove it. The procedure is similar to the one performed when the port was put in.  °WHEN SHOULD I SEEK IMMEDIATE MEDICAL CARE? °When you have an implanted port, you should seek immediate medical care if:  °· You notice a bad smell coming from the incision site.   °· You have swelling, redness, or drainage at the incision site.   °· You have more swelling or pain at the port site or the surrounding area.   °· You have a fever that is not controlled with  medicine. °  °This information is not intended to replace advice given to you by your health care provider. Make sure you discuss any questions you have with your health care provider. °  °Document Released: 06/04/2005 Document Revised: 03/25/2013 Document Reviewed: 02/09/2013 °Elsevier Interactive Patient Education ©2016 Elsevier Inc[employer] ° °

## 2015-11-18 NOTE — Transfer of Care (Signed)
Immediate Anesthesia Transfer of Care Note  Patient: Derek Rodriguez  Procedure(s) Performed: Procedure(s): INSERTION PORT-A-CATH LEFT SUBCLAVIAN (N/A)  Patient Location: PACU  Anesthesia Type:MAC  Level of Consciousness: awake and alert   Airway & Oxygen Therapy: Patient Spontanous Breathing  Post-op Assessment: Report given to RN and Post -op Vital signs reviewed and stable  Post vital signs: Reviewed  Last Vitals:  Filed Vitals:   11/18/15 0835 11/18/15 0840  BP: 114/82   Temp:    Resp: 12 13    Last Pain: There were no vitals filed for this visit.    Patients Stated Pain Goal: 7 ([id number])  Complications: No apparent anesthesia complications

## 2015-11-21 ENCOUNTER — Other Ambulatory Visit (HOSPITAL_COMMUNITY): Payer: Self-pay | Admitting: *Deleted

## 2015-11-21 ENCOUNTER — Encounter (HOSPITAL_COMMUNITY): Payer: Self-pay | Admitting: General Surgery

## 2015-11-25 ENCOUNTER — Encounter (HOSPITAL_COMMUNITY): Payer: Self-pay | Admitting: Hematology & Oncology

## 2015-11-25 ENCOUNTER — Encounter (HOSPITAL_COMMUNITY): Payer: BLUE CROSS/BLUE SHIELD | Attending: Oncology

## 2015-11-25 ENCOUNTER — Encounter (HOSPITAL_BASED_OUTPATIENT_CLINIC_OR_DEPARTMENT_OTHER): Payer: BLUE CROSS/BLUE SHIELD | Admitting: Hematology & Oncology

## 2015-11-25 VITALS — BP 122/73 | HR 61 | Temp 98.2°F | Resp 16 | Wt 239.2 lb

## 2015-11-25 VITALS — BP 118/76 | HR 79 | Temp 97.9°F | Resp 18

## 2015-11-25 DIAGNOSIS — C187 Malignant neoplasm of sigmoid colon: Secondary | ICD-10-CM | POA: Insufficient documentation

## 2015-11-25 DIAGNOSIS — Z833 Family history of diabetes mellitus: Secondary | ICD-10-CM | POA: Insufficient documentation

## 2015-11-25 DIAGNOSIS — Z79899 Other long term (current) drug therapy: Secondary | ICD-10-CM | POA: Insufficient documentation

## 2015-11-25 DIAGNOSIS — R112 Nausea with vomiting, unspecified: Secondary | ICD-10-CM | POA: Diagnosis not present

## 2015-11-25 DIAGNOSIS — M1990 Unspecified osteoarthritis, unspecified site: Secondary | ICD-10-CM | POA: Diagnosis not present

## 2015-11-25 DIAGNOSIS — K5792 Diverticulitis of intestine, part unspecified, without perforation or abscess without bleeding: Secondary | ICD-10-CM | POA: Diagnosis not present

## 2015-11-25 DIAGNOSIS — Z9889 Other specified postprocedural states: Secondary | ICD-10-CM | POA: Diagnosis not present

## 2015-11-25 DIAGNOSIS — I10 Essential (primary) hypertension: Secondary | ICD-10-CM | POA: Insufficient documentation

## 2015-11-25 DIAGNOSIS — D649 Anemia, unspecified: Secondary | ICD-10-CM | POA: Diagnosis not present

## 2015-11-25 DIAGNOSIS — Z87891 Personal history of nicotine dependence: Secondary | ICD-10-CM | POA: Diagnosis not present

## 2015-11-25 DIAGNOSIS — C779 Secondary and unspecified malignant neoplasm of lymph node, unspecified: Secondary | ICD-10-CM | POA: Diagnosis not present

## 2015-11-25 DIAGNOSIS — Z5111 Encounter for antineoplastic chemotherapy: Secondary | ICD-10-CM | POA: Diagnosis not present

## 2015-11-25 DIAGNOSIS — T451X5A Adverse effect of antineoplastic and immunosuppressive drugs, initial encounter: Secondary | ICD-10-CM

## 2015-11-25 LAB — CBC WITH DIFFERENTIAL/PLATELET
BASOS ABS: 0 10*3/uL (ref 0.0–0.1)
BASOS PCT: 1 %
EOS ABS: 0.1 10*3/uL (ref 0.0–0.7)
EOS PCT: 3 %
HCT: 36.2 % — ABNORMAL LOW (ref 39.0–52.0)
Hemoglobin: 12.1 g/dL — ABNORMAL LOW (ref 13.0–17.0)
LYMPHS PCT: 28 %
Lymphs Abs: 1.2 10*3/uL (ref 0.7–4.0)
MCH: 26.7 pg (ref 26.0–34.0)
MCHC: 33.4 g/dL (ref 30.0–36.0)
MCV: 79.7 fL (ref 78.0–100.0)
Monocytes Absolute: 0.6 10*3/uL (ref 0.1–1.0)
Monocytes Relative: 15 %
Neutro Abs: 2.3 10*3/uL (ref 1.7–7.7)
Neutrophils Relative %: 53 %
PLATELETS: 241 10*3/uL (ref 150–400)
RBC: 4.54 MIL/uL (ref 4.22–5.81)
RDW: 15.7 % — ABNORMAL HIGH (ref 11.5–15.5)
WBC: 4.2 10*3/uL (ref 4.0–10.5)

## 2015-11-25 LAB — COMPREHENSIVE METABOLIC PANEL
ALBUMIN: 3.5 g/dL (ref 3.5–5.0)
ALT: 15 U/L — AB (ref 17–63)
AST: 19 U/L (ref 15–41)
Alkaline Phosphatase: 72 U/L (ref 38–126)
Anion gap: 6 (ref 5–15)
BUN: 17 mg/dL (ref 6–20)
CALCIUM: 8.6 mg/dL — AB (ref 8.9–10.3)
CO2: 23 mmol/L (ref 22–32)
CREATININE: 0.86 mg/dL (ref 0.61–1.24)
Chloride: 106 mmol/L (ref 101–111)
GFR calc Af Amer: >60 mL/min (ref >60)
GFR calc non Af Amer: >60 mL/min (ref >60)
GLUCOSE: 139 mg/dL — AB (ref 65–99)
POTASSIUM: 3.9 mmol/L (ref 3.5–5.1)
SODIUM: 135 mmol/L (ref 135–145)
TOTAL PROTEIN: 6.7 g/dL (ref 6.5–8.1)
Total Bilirubin: 0.4 mg/dL (ref 0.3–1.2)

## 2015-11-25 MED ORDER — SODIUM CHLORIDE 0.9 % IV SOLN
2400.0000 mg/m2 | INTRAVENOUS | Status: DC
  Administered 2015-11-25: 5500 mg via INTRAVENOUS
  Filled 2015-11-25: qty 100

## 2015-11-25 MED ORDER — SODIUM CHLORIDE 0.9 % IV SOLN
Freq: Once | INTRAVENOUS | Status: AC
  Administered 2015-11-25: 11:00:00 via INTRAVENOUS
  Filled 2015-11-25: qty 5

## 2015-11-25 MED ORDER — HEPARIN SOD (PORK) LOCK FLUSH 100 UNIT/ML IV SOLN
500.0000 [IU] | Freq: Once | INTRAVENOUS | Status: DC | PRN
  Filled 2015-11-25: qty 5

## 2015-11-25 MED ORDER — SODIUM CHLORIDE 0.9% FLUSH: 10.0000 mL | INTRAVENOUS | Status: DC | PRN

## 2015-11-25 MED ORDER — OXALIPLATIN CHEMO INJECTION 100 MG/20ML
85.0000 mg/m2 | Freq: Once | INTRAVENOUS | Status: AC
  Administered 2015-11-25: 195 mg via INTRAVENOUS
  Filled 2015-11-25: qty 39

## 2015-11-25 MED ORDER — PALONOSETRON HCL INJECTION 0.25 MG/5ML
0.2500 mg | Freq: Once | INTRAVENOUS | Status: AC
  Administered 2015-11-25: 0.25 mg via INTRAVENOUS
  Filled 2015-11-25: qty 5

## 2015-11-25 MED ORDER — DEXTROSE 5 % IV SOLN
Freq: Once | INTRAVENOUS | Status: AC
  Administered 2015-11-25: 10:00:00 via INTRAVENOUS

## 2015-11-25 MED ORDER — LORAZEPAM 2 MG/ML IJ SOLN
0.5000 mg | Freq: Once | INTRAMUSCULAR | Status: AC
  Administered 2015-11-25: 0.5 mg via INTRAVENOUS
  Filled 2015-11-25: qty 1

## 2015-11-25 MED ORDER — LEUCOVORIN CALCIUM INJECTION 350 MG
400.0000 mg/m2 | Freq: Once | INTRAMUSCULAR | Status: AC
  Administered 2015-11-25: 920 mg via INTRAVENOUS
  Filled 2015-11-25: qty 46

## 2015-11-25 MED ORDER — FLUOROURACIL CHEMO INJECTION 2.5 GM/50ML
400.0000 mg/m2 | Freq: Once | INTRAVENOUS | Status: AC
  Administered 2015-11-25: 900 mg via INTRAVENOUS
  Filled 2015-11-25: qty 18

## 2015-11-25 MED ORDER — LORAZEPAM 0.5 MG PO TABS: 0.5000 mg | ORAL_TABLET | Freq: Four times a day (QID) | ORAL | Status: DC | PRN

## 2015-11-25 NOTE — Progress Notes (Signed)
[facility] Hematology/Oncology Consultation   Name: Derek Rodriguez      MRN: [id number]     Date: 11/29/2015 Time:7:56 AM   REFERRING PHYSICIAN:  Aviva Signs, MD (Gen Surg)  REASON FOR CONSULT:  Adenocarcinoma of sigmoid colon.   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    Adenocarcinoma of sigmoid colon ([place])   09/09/2015 - 09/12/2015 Hospital Admission Diverticulitis of colon with perforation   09/12/2015 Imaging CT abd/pelvis- Continued severe inflammation of the proximal half of the sigmoid colon. There is a 2.6 cm oval low-attenuation structure extending between the posterior wall of the inflamed sigmoid colon and the dome of the bladder....   09/27/2015 Tumor Marker CEA: 2.5    10/03/2015 Procedure Partial colectomy, splenic flexure takedown by Dr. Arnoldo Morale   10/03/2015 Pathology Results FULL THICKNESS INVASIVE MODERATELY TO POORLY DIFFERENTIATED ADENOCARCINOMA WITH SURFACE ULCERATION. TUMOR INVADES THROUGH MUSCULARIS MUCOSA AND PERFORATES SEROSAL SURFACE. - TUMOR MEASURES 10.3 CM IN GREATEST DIMENSION. - EXTENSIVE LYMPH/VASCULAR INVASI   10/13/2015 Pathology Results MSI STABLE   10/20/2015 Imaging CT chest- Negative. No evidence of metastatic disease or other active disease within the thorax.   11/04/2015 - 11/04/2015 Chemotherapy CapeOX   11/12/2015 Miscellaneous ED visit with nausea and decreased po intake   11/18/2015 Procedure Port-A-Cath insertion by Dr. Arnoldo Morale   11/25/2015 Treatment Plan Change From Xelox to FOLFOX   11/25/2015 -  Chemotherapy FOLFOX    HISTORY OF PRESENT ILLNESS:   Derek Rodriguez is a 55 year old white American man with a past medical history significant for diverticulitis and HTN who recently was diagnosed with a Stage IIIC ([id number]) adenocarcinoma of sigmoid colon cancer, S/P partial colectomy by Dr. Arnoldo Morale.  Derek Rodriguez returns to the [facility] today accompanied by his wife. He is in a treatment room reclining in a  chair.  He was advised to try to treat nausea before it comes, and to drink plenty of fluids.  His wife notes that he's been taking Ativan in the morning and this has been very helpful for his nausea. He notes "that's the one thing that has worked." "That's really been helping; helping my nerves too."  During the physical exam, he confirms that his belly is okay. He says he's been doing okay for about a week. He has been eating well. Sleeping well. No diarrhea. No headaches. No vomiting. Weight is up 10 pounds.   Review of Systems  Constitutional: Negative for fever, chills, weight loss, malaise/fatigue and diaphoresis.  HENT: Negative.  Negative for congestion and nosebleeds.   Eyes: Negative.   Respiratory: Negative.  Negative for cough, hemoptysis and sputum production.   Cardiovascular: Negative.  Negative for chest pain and palpitations.  Gastrointestinal: Negative.  Negative for heartburn, nausea, vomiting, abdominal pain, diarrhea, constipation, blood in stool and melena.  Genitourinary: Negative.   Musculoskeletal: Negative.  Negative for myalgias and neck pain.  Skin: Negative.  Negative for rash.  Neurological: Negative.  Negative for weakness and headaches.  Endo/Heme/Allergies: Negative.   Psychiatric/Behavioral: The patient is nervous/anxious.   14 point review of systems was performed and is negative except as detailed under history of present illness and above   PAST MEDICAL HISTORY:   Past Medical History  Diagnosis Date  . Diverticulitis   . Hypertension   . Arthritis   . Adenocarcinoma of sigmoid colon ([place]) 10/18/2015    ALLERGIES: No Known Allergies    MEDICATIONS: I have reviewed the patient's  current medications.    Current Outpatient Prescriptions on File Prior to Visit  Medication Sig Dispense Refill  . ibuprofen (ADVIL,MOTRIN) 200 MG tablet Take 400 mg by mouth every 6 (six) hours as needed for mild pain or moderate pain.    Marland Kitchen lidocaine-prilocaine  (EMLA) cream Apply a quarter size amount to port site 1 hour prior to chemo. Do not rub in. Cover with plastic wrap. 30 g 3  . lisinopril (PRINIVIL,ZESTRIL) 10 MG tablet Take 10 mg by mouth daily.    . ondansetron (ZOFRAN) 8 MG tablet Take 1 tablet (8 mg total) by mouth every 8 (eight) hours as needed for nausea or vomiting. 30 tablet 2  . OXALIPLATIN IV Inject into the vein. (chemo) To be given once every 21 days    . oxyCODONE-acetaminophen (PERCOCET) 7.5-325 MG tablet Take 1-2 tablets by mouth every 4 (four) hours as needed. 30 tablet 0  . Probiotic Product ([place]) CAPS Take 1 capsule by mouth daily.    . promethazine (PHENERGAN) 25 MG suppository Place 1 suppository (25 mg total) rectally every 6 (six) hours as needed for nausea or vomiting. 6 each 0  . ranitidine (ZANTAC) 150 MG tablet Take 150 mg by mouth daily as needed for heartburn.    . zolpidem (AMBIEN) 10 MG tablet Take 1 tablet (10 mg total) by mouth at bedtime as needed for sleep. 30 tablet 0   No current facility-administered medications on file prior to visit.     PAST SURGICAL HISTORY Past Surgical History  Procedure Laterality Date  . Partial colectomy N/A 10/03/2015    Procedure: PARTIAL COLECTOMY;  Surgeon: Aviva Signs, MD;  Location: [facility];  Service: General;  Laterality: N/A;  . Portacath placement N/A 11/18/2015    Procedure: INSERTION PORT-A-CATH LEFT SUBCLAVIAN;  Surgeon: Aviva Signs, MD;  Location: [facility];  Service: General;  Laterality: N/A;    FAMILY HISTORY: Family History  Problem Relation Age of Onset  . Diabetes Father    No known family history of cancer Mother alive at age 54- healthy Father deceased at age of 13 from heart disease, complications of DM 59 son 37 years old- healthy 5 grandson 72 year old- healthy  SOCIAL HISTORY:  reports that he quit smoking about 30 years ago. His smoking use included Cigars and Pipe. He quit smokeless tobacco use about 5 months ago. His smokeless  tobacco use included Snuff. He reports that he does not drink alcohol or use illicit drugs.  He works at [employer].  He is married x 37 years.  He is [employer].  PERFORMANCE STATUS: The patient's performance status is 0 - Asymptomatic   PHYSICAL EXAM: Most Recent Vital Signs: Blood pressure 122/73, pulse 61, temperature 98.2 F (36.8 C), temperature source Oral, resp. rate 16, weight 239 lb 3.2 oz (108.5 kg), SpO2 99 %. General appearance: alert, cooperative, appears stated age, no distress, mildly obese and accompanied by his wife. Head: Normocephalic, without obvious abnormality, atraumatic Eyes: negative findings: lids and lashes normal, conjunctivae and sclerae normal and corneas clear Throat: normal findings: lips normal without lesions, buccal mucosa normal, tongue midline and normal, soft palate, uvula, and tonsils normal and oropharynx pink & moist without lesions or evidence of thrush and abnormal findings: dentition: poor Neck: no adenopathy, supple, symmetrical, trachea midline and thyroid not enlarged, symmetric, no tenderness/mass/nodules Lungs: clear to auscultation bilaterally and normal percussion bilaterally Heart: regular rate and rhythm, S1, S2 normal, no murmur, click, rub or gallop Abdomen: soft, non-tender;  bowel sounds normal; no masses,  no organomegaly and low-midline surgical site is well healed Extremities: extremities normal, atraumatic, no cyanosis or edema Skin: Skin color, texture, turgor normal. No rashes or lesions Neurologic: Alert and oriented X 3, normal strength and tone. Normal symmetric reflexes. Normal coordination and gait  LABORATORY DATA:  I have reviewed the data as listed. CBC    Component Value Date/Time   WBC 4.2 11/25/2015 0910   RBC 4.54 11/25/2015 0910   HGB 12.1* 11/25/2015 0910   HCT 36.2* 11/25/2015 0910   PLT 241 11/25/2015 0910   MCV 79.7 11/25/2015 0910   MCH 26.7 11/25/2015 0910   MCHC 33.4 11/25/2015 0910   RDW 15.7*  11/25/2015 0910   LYMPHSABS 1.2 11/25/2015 0910   MONOABS 0.6 11/25/2015 0910   EOSABS 0.1 11/25/2015 0910   BASOSABS 0.0 11/25/2015 0910      Chemistry      Component Value Date/Time   NA 135 11/25/2015 0910   K 3.9 11/25/2015 0910   CL 106 11/25/2015 0910   CO2 23 11/25/2015 0910   BUN 17 11/25/2015 0910   CREATININE 0.86 11/25/2015 0910      Component Value Date/Time   CALCIUM 8.6* 11/25/2015 0910   ALKPHOS 72 11/25/2015 0910   AST 19 11/25/2015 0910   ALT 15* 11/25/2015 0910   BILITOT 0.4 11/25/2015 0910     Lab Results  Component Value Date   CEA 2.5 09/27/2015     RADIOGRAPHY: I have personally reviewed the radiological images as listed and agreed with the findings in the report.  Study Result     CLINICAL DATA: Nausea and weakness after chemotherapy treatment.  EXAM: DG ABDOMEN ACUTE W/ 1V CHEST  COMPARISON: None.  FINDINGS: There is no evidence of dilated bowel loops or free intraperitoneal air. No radiopaque calculi or other significant radiographic abnormality is seen. Heart size and mediastinal contours are within normal limits. Both lungs are clear.  IMPRESSION: Negative abdominal radiographs. No acute cardiopulmonary disease.   Electronically Signed  By: Abelardo Diesel M.D.  On: 11/09/2015 20:18    Study Result     CLINICAL DATA: Newly diagnosed sigmoid colon adenocarcinoma. Staging.  EXAM: CT CHEST WITH CONTRAST  TECHNIQUE: Multidetector CT imaging of the chest was performed during intravenous contrast administration.  CONTRAST: 5m ISOVUE-300 IOPAMIDOL (ISOVUE-300) INJECTION 61%  COMPARISON: None.  FINDINGS: Mediastinum/Lymph Nodes: No masses, pathologically enlarged lymph nodes, or other significant abnormality.  Lungs/Pleura: No pulmonary mass, infiltrate, or effusion.  Upper abdomen: No acute findings.  Musculoskeletal: No chest wall mass or suspicious bone  lesions identified.  IMPRESSION: Negative. No evidence of metastatic disease or other active disease within the thorax.   Electronically Signed  By: JEarle GellM.D.  On: 10/20/2015 18:07     PATHOLOGY:    Diagnosis Colon, segmental resection, sigmoid - FULL THICKNESS INVASIVE MODERATELY TO POORLY DIFFERENTIATED ADENOCARCINOMA WITH SURFACE ULCERATION. 1 of 4 FINAL [place] ([phone] Diagnosis(continued) - TUMOR INVADES THROUGH MUSCULARIS MUCOSA AND PERFORATES SEROSAL SURFACE. - TUMOR MEASURES 10.3 CM IN GREATEST DIMENSION. - EXTENSIVE LYMPH/VASCULAR INVASION IS IDENTIFIED. - DISTAL MARGIN DEMONSTRATES ADENOCARCINOMA INVOLVING THE LYMPH/VASCULAR SPACES. - PROXIMAL MARGIN IS NEGATIVE. - ELEVEN OF TWELVE LYMPH NODES ARE POSITIVE FOR METASTATIC ADENOCARCINOMA (11/12). - EXTRACAPSULAR EXTENSION IS PRESENT. 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  ASSESSMENT/PLAN:   Adenocarcinoma of sigmoid colon ([place], high risk stage III Intolerance to CAPEOX Chemotherapy induced nausea and vomiting  Stage IIIC  adenocarcinoma of sigmoid colon cancer, MSI STABLE, moderate to poorly differentiated, invading through the muscularis mucosa and perforating the serosal surface, measuring 10.3 CM in largest dimension, extensive LVI, 11/12 positive lymph nodes, extracapsular extension is present, positive tumor satellite nodules are noted.  Unfortunately he is at high risk for recurrence and I strongly emphasized the need for adjuvant therapy. I would also recommend potential referral near the completion of therapy for HIPEC evaluation at [facility]. Ermias agreed to try FOLFOX after he was intolerant of XELOX. He is here today for cycle #1.  I have added EMEND to his regimen given that he presented to the ED after his last cycle with nausea and vomiting. In addition he had a 10 pound weight loss. I have encouraged him to come  in with nausea/vomiting that is not relieved with anti-emetics he has at home.   He will need a refill on his Ativan.  Will return to the clinic in one week to assess tolerance.  All questions were answered. The patient knows to call the clinic with any problems, questions or concerns. We can certainly see the patient much sooner if necessary.  This document serves as a record of services personally performed by Ancil Linsey, MD. It was created on her behalf by Toni Amend, a trained medical scribe. The creation of this record is based on the scribe's personal observations and the provider'sstatements to them. This document has been checked and approved by the attending provider.  I have reviewed the above documentation for accuracy and completeness, and I agree with the above.  This note is electronically signed by: Molli Hazard, MD  11/29/2015 7:56 AM

## 2015-11-25 NOTE — Patient Instructions (Addendum)
[employer] [facility] [employer] Discharge Instructions for Patients Receiving Chemotherapy   Beginning January 23rd 2017 lab work for the [facility] will be done in the  Main lab at [facility] on 1st floor. If you have a lab appointment with the [facility] please come in thru the  Main Entrance and check in at the main information desk   Today you received the following chemotherapy agents:  Oxaliplatin, leucovorin, and 21fu  To help prevent nausea and vomiting after your treatment, we encourage you to take your nausea medication as prescribed.   If you develop nausea and vomiting, or diarrhea that is not controlled by your medication, call the clinic.  The clinic phone number is [phone]. Office hours are Monday-Friday 8:30am-5:00pm.  BELOW ARE SYMPTOMS THAT SHOULD BE REPORTED IMMEDIATELY:  *FEVER GREATER THAN 101.0 F  *CHILLS WITH OR WITHOUT FEVER  NAUSEA AND VOMITING THAT IS NOT CONTROLLED WITH YOUR NAUSEA MEDICATION  *UNUSUAL SHORTNESS OF BREATH  *UNUSUAL BRUISING OR BLEEDING  TENDERNESS IN MOUTH AND THROAT WITH OR WITHOUT PRESENCE OF ULCERS  *URINARY PROBLEMS  *BOWEL PROBLEMS  UNUSUAL RASH Items with * indicate a potential emergency and should be followed up as soon as possible. If you have an emergency after office hours please contact your primary care physician or go to the nearest emergency department.  Please call the clinic during office hours if you have any questions or concerns.   You may also contact the Patient Navigator at [phone] should you have any questions or need assistance in obtaining follow up care.      Resources For Cancer Patients and their Caregivers ? American Cancer Society: Can assist with transportation, wigs, general needs, runs Look Good Feel Better.        651 064 2575 ? Cancer Care: Provides financial assistance, online support groups, medication/co-pay assistance.  1-800-813-HOPE 865-380-2141) ? [facility] Assists [place] cancer patients and their families through emotional , educational and financial support.  413-378-7718 ? [place] DSS Where to apply for food stamps, Medicaid and utility assistance. 629-483-7002 ? RCATS: Transportation to medical appointments. 610-196-7577 ? Social Security Administration: May apply for disability if have a Stage IV cancer. 7855563328 7571124657 ? [employer], Disability and Transit Services: Assists with nutrition, care and transit needs. 806-233-1487        Fluorouracil, 5-FU injection What is this medicine? FLUOROURACIL, 5-FU (flure oh YOOR a sil) is a chemotherapy drug. It slows the growth of cancer cells. This medicine is used to treat many types of cancer like breast cancer, colon or rectal cancer, pancreatic cancer, and stomach cancer. This medicine may be used for other purposes; ask your health care provider or pharmacist if you have questions. What should I tell my health care provider before I take this medicine? They need to know if you have any of these conditions: -blood disorders -dihydropyrimidine dehydrogenase (DPD) deficiency -infection (especially a virus infection such as chickenpox, cold sores, or herpes) -kidney disease -liver disease -malnourished, poor nutrition -recent or ongoing radiation therapy -an unusual or allergic reaction to fluorouracil, other chemotherapy, other medicines, foods, dyes, or preservatives -pregnant or trying to get pregnant -breast-feeding How should I use this medicine? This drug is given as an infusion or injection into a vein. It is administered in a hospital or clinic by a specially trained health care professional. Talk to your pediatrician regarding the use of this medicine in children. Special care may be needed. Overdosage: If you  think you have taken too much of this medicine contact a poison control center or emergency room at once. NOTE: This  medicine is only for you. Do not share this medicine with others. What if I miss a dose? It is important not to miss your dose. Call your doctor or health care professional if you are unable to keep an appointment. What may interact with this medicine? -allopurinol -cimetidine -dapsone -digoxin -hydroxyurea -leucovorin -levamisole -medicines for seizures like ethotoin, fosphenytoin, phenytoin -medicines to increase blood counts like filgrastim, pegfilgrastim, sargramostim -medicines that treat or prevent blood clots like warfarin, enoxaparin, and dalteparin -methotrexate -metronidazole -pyrimethamine -some other chemotherapy drugs like busulfan, cisplatin, estramustine, vinblastine -trimethoprim -trimetrexate -vaccines Talk to your doctor or health care professional before taking any of these medicines: -acetaminophen -aspirin -ibuprofen -ketoprofen -naproxen This list may not describe all possible interactions. Give your health care provider a list of all the medicines, herbs, non-prescription drugs, or dietary supplements you use. Also tell them if you smoke, drink alcohol, or use illegal drugs. Some items may interact with your medicine. What should I watch for while using this medicine? Visit your doctor for checks on your progress. This drug may make you feel generally unwell. This is not uncommon, as chemotherapy can affect healthy cells as well as cancer cells. Report any side effects. Continue your course of treatment even though you feel ill unless your doctor tells you to stop. In some cases, you may be given additional medicines to help with side effects. Follow all directions for their use. Call your doctor or health care professional for advice if you get a fever, chills or sore throat, or other symptoms of a cold or flu. Do not treat yourself. This drug decreases your body's ability to fight infections. Try to avoid being around people who are sick. This medicine may  increase your risk to bruise or bleed. Call your doctor or health care professional if you notice any unusual bleeding. Be careful brushing and flossing your teeth or using a toothpick because you may get an infection or bleed more easily. If you have any dental work done, tell your dentist you are receiving this medicine. Avoid taking products that contain aspirin, acetaminophen, ibuprofen, naproxen, or ketoprofen unless instructed by your doctor. These medicines may hide a fever. Do not become pregnant while taking this medicine. Women should inform their doctor if they wish to become pregnant or think they might be pregnant. There is a potential for serious side effects to an unborn child. Talk to your health care professional or pharmacist for more information. Do not breast-feed an infant while taking this medicine. Men should inform their doctor if they wish to father a child. This medicine may lower sperm counts. Do not treat diarrhea with over the counter products. Contact your doctor if you have diarrhea that lasts more than 2 days or if it is severe and watery. This medicine can make you more sensitive to the sun. Keep out of the sun. If you cannot avoid being in the sun, wear protective clothing and use sunscreen. Do not use sun lamps or tanning beds/booths. What side effects may I notice from receiving this medicine? Side effects that you should report to your doctor or health care professional as soon as possible: -allergic reactions like skin rash, itching or hives, swelling of the face, lips, or tongue -low blood counts - this medicine may decrease the number of white blood cells, red blood cells and platelets. You may be  at increased risk for infections and bleeding. -signs of infection - fever or chills, cough, sore throat, pain or difficulty passing urine -signs of decreased platelets or bleeding - bruising, pinpoint red spots on the skin, black, tarry stools, blood in the urine -signs  of decreased red blood cells - unusually weak or tired, fainting spells, lightheadedness -breathing problems -changes in vision -chest pain -mouth sores -nausea and vomiting -pain, swelling, redness at site where injected -pain, tingling, numbness in the hands or feet -redness, swelling, or sores on hands or feet -stomach pain -unusual bleeding Side effects that usually do not require medical attention (report to your doctor or health care professional if they continue or are bothersome): -changes in finger or toe nails -diarrhea -dry or itchy skin -hair loss -headache -loss of appetite -sensitivity of eyes to the light -stomach upset -unusually teary eyes This list may not describe all possible side effects. Call your doctor for medical advice about side effects. You may report side effects to FDA at 1-800-FDA-1088. Where should I keep my medicine? This drug is given in a hospital or clinic and will not be stored at home. NOTE: This sheet is a summary. It may not cover all possible information. If you have questions about this medicine, talk to your doctor, pharmacist, or health care provider.    2016, Elsevier/Gold Standard. (2007-10-08 13:53:16)

## 2015-11-25 NOTE — Progress Notes (Signed)
1400:  Tolerated tx w/o adverse reaction.  A&Ox4, in no distress.  VSS.  Home infusion pump connected and infusing - education done with pt; verbalizes understanding.

## 2015-11-25 NOTE — Patient Instructions (Signed)
[place] at [facility] Discharge Instructions  RECOMMENDATIONS MADE BY THE CONSULTANT AND ANY TEST RESULTS WILL BE SENT TO YOUR REFERRING PHYSICIAN.  Return to clinic in 1 week to assess your tolerance to this chemo regimen  Ativan prescription given  Return in 2 weeks for chemo/MD appt  Thank you for choosing [place] at [facility] to provide your oncology and hematology care.  To afford each patient quality time with our provider, please arrive at least 15 minutes before your scheduled appointment time.   Beginning January 23rd 2017 lab work for the [facility] will be done in the  Main lab at [facility] on 1st floor. If you have a lab appointment with the [facility] please come in thru the  Main Entrance and check in at the main information desk  You need to re-schedule your appointment should you arrive 10 or more minutes late.  We strive to give you quality time with our providers, and arriving late affects you and other patients whose appointments are after yours.  Also, if you no show three or more times for appointments you may be dismissed from the clinic at the providers discretion.     Again, thank you for choosing [facility].  Our hope is that these requests will decrease the amount of time that you wait before being seen by our physicians.       _____________________________________________________________  Should you have questions after your visit to [facility], please contact our office at [phone] between the hours of 8:30 a.m. and 4:30 p.m.  Voicemails left after 4:30 p.m. will not be returned until the following business day.  For prescription refill requests, have your pharmacy contact our office.         Resources For Cancer Patients and their Caregivers ? American Cancer Society: Can assist with transportation, wigs, general needs, runs Look Good Feel Better.         ([phone] ? Cancer Care: Provides financial assistance, online support groups, medication/co-pay assistance.  1-800-813-HOPE 5202686943) ? [facility] Assists [place] cancer patients and their families through emotional , educational and financial support.  574-680-7510 ? [place] DSS Where to apply for food stamps, Medicaid and utility assistance. 801 435 4905 ? RCATS: Transportation to medical appointments. 228-317-2503 ? Social Security Administration: May apply for disability if have a Stage IV cancer. 7026940271 873-042-6472 ? [employer], Disability and Transit Services: Assists with nutrition, care and transit needs. [place] Support Programs: @10RELATIVEDAYS @ > Cancer Support Group  2nd Tuesday of the month 1pm-2pm, Journey Room  > Creative Journey  3rd Tuesday of the month 1130am-1pm, Journey Room  > Look Good Feel Better  1st Wednesday of the month 10am-12 noon, Journey Room (Call [facility] to register [phone])

## 2015-11-27 ENCOUNTER — Encounter (HOSPITAL_COMMUNITY): Payer: Self-pay

## 2015-11-27 ENCOUNTER — Encounter (HOSPITAL_BASED_OUTPATIENT_CLINIC_OR_DEPARTMENT_OTHER): Payer: BLUE CROSS/BLUE SHIELD

## 2015-11-27 VITALS — BP 116/76 | HR 67 | Temp 98.3°F | Resp 16

## 2015-11-27 DIAGNOSIS — Z5111 Encounter for antineoplastic chemotherapy: Secondary | ICD-10-CM | POA: Diagnosis not present

## 2015-11-27 DIAGNOSIS — C187 Malignant neoplasm of sigmoid colon: Secondary | ICD-10-CM | POA: Diagnosis not present

## 2015-11-27 DIAGNOSIS — C779 Secondary and unspecified malignant neoplasm of lymph node, unspecified: Secondary | ICD-10-CM | POA: Diagnosis not present

## 2015-11-27 MED ORDER — HEPARIN SOD (PORK) LOCK FLUSH 100 UNIT/ML IV SOLN
500.0000 [IU] | Freq: Once | INTRAVENOUS | Status: AC | PRN
  Administered 2015-11-27: 500 [IU]
  Filled 2015-11-27: qty 5

## 2015-11-27 MED ORDER — SODIUM CHLORIDE 0.9% FLUSH
10.0000 mL | INTRAVENOUS | Status: DC | PRN
  Administered 2015-11-27: 10 mL
  Filled 2015-11-27: qty 10

## 2015-11-27 NOTE — Patient Instructions (Addendum)
[place] at [facility] Discharge Instructions  RECOMMENDATIONS MADE BY THE CONSULTANT AND ANY TEST RESULTS WILL BE SENT TO YOUR REFERRING PHYSICIAN.  Pump removal and port flush today. You will receive a call tomorrow for 24 hour follow-up post chemo. Return as scheduled for office visit and treatment. Call the clinic should you have any questions or concerns prior to your next visit.  Thank you for choosing [place] at [facility] to provide your oncology and hematology care.  To afford each patient quality time with our provider, please arrive at least 15 minutes before your scheduled appointment time.   Beginning January 23rd 2017 lab work for the [facility] will be done in the  Main lab at [facility] on 1st floor. If you have a lab appointment with the [facility] please come in thru the  Main Entrance and check in at the main information desk  You need to re-schedule your appointment should you arrive 10 or more minutes late.  We strive to give you quality time with our providers, and arriving late affects you and other patients whose appointments are after yours.  Also, if you no show three or more times for appointments you may be dismissed from the clinic at the providers discretion.     Again, thank you for choosing [facility].  Our hope is that these requests will decrease the amount of time that you wait before being seen by our physicians.       _____________________________________________________________  Should you have questions after your visit to [facility], please contact our office at [phone] between the hours of 8:30 a.m. and 4:30 p.m.  Voicemails left after 4:30 p.m. will not be returned until the following business day.  For prescription refill requests, have your pharmacy contact our office.         Resources For Cancer Patients and their Caregivers ? American Cancer  Society: Can assist with transportation, wigs, general needs, runs Look Good Feel Better.        9541191466 ? Cancer Care: Provides financial assistance, online support groups, medication/co-pay assistance.  1-800-813-HOPE (820)258-7132) ? [facility] Assists [place] cancer patients and their families through emotional , educational and financial support.  928-296-4442 ? [place] DSS Where to apply for food stamps, Medicaid and utility assistance. 510-487-5111 ? RCATS: Transportation to medical appointments. 807 342 9755 ? Social Security Administration: May apply for disability if have a Stage IV cancer. 480-160-2577 [phone])[phone] ? [employer], Disability and Transit Services: Assists with nutrition, care and transit needs. [place] Support Programs: @10RELATIVEDAYS @ > Cancer Support Group  2nd Tuesday of the month 1pm-2pm, Journey Room  > Creative Journey  3rd Tuesday of the month 1130am-1pm, Journey Room  > Look Good Feel Better  1st Wednesday of the month 10am-12 noon, Journey Room (Call American Cancer Society to register [phone])  Fluorouracil, 5-FU injection What is this medicine? FLUOROURACIL, 5-FU (flure oh YOOR a sil) is a chemotherapy drug. It slows the growth of cancer cells. This medicine is used to treat many types of cancer like breast cancer, colon or rectal cancer, pancreatic cancer, and stomach cancer. This medicine may be used for other purposes; ask your health care provider or pharmacist if you have questions. What should I tell my health care provider before I take this medicine? They need to know if you have any of these conditions: -blood disorders -  dihydropyrimidine dehydrogenase (DPD) deficiency -infection (especially a virus infection such as chickenpox, cold sores, or herpes) -kidney disease -liver disease -malnourished, poor nutrition -recent or ongoing radiation therapy -an unusual or  allergic reaction to fluorouracil, other chemotherapy, other medicines, foods, dyes, or preservatives -pregnant or trying to get pregnant -breast-feeding How should I use this medicine? This drug is given as an infusion or injection into a vein. It is administered in a hospital or clinic by a specially trained health care professional. Talk to your pediatrician regarding the use of this medicine in children. Special care may be needed. Overdosage: If you think you have taken too much of this medicine contact a poison control center or emergency room at once. NOTE: This medicine is only for you. Do not share this medicine with others. What if I miss a dose? It is important not to miss your dose. Call your doctor or health care professional if you are unable to keep an appointment. What may interact with this medicine? -allopurinol -cimetidine -dapsone -digoxin -hydroxyurea -leucovorin -levamisole -medicines for seizures like ethotoin, fosphenytoin, phenytoin -medicines to increase blood counts like filgrastim, pegfilgrastim, sargramostim -medicines that treat or prevent blood clots like warfarin, enoxaparin, and dalteparin -methotrexate -metronidazole -pyrimethamine -some other chemotherapy drugs like busulfan, cisplatin, estramustine, vinblastine -trimethoprim -trimetrexate -vaccines Talk to your doctor or health care professional before taking any of these medicines: -acetaminophen -aspirin -ibuprofen -ketoprofen -naproxen This list may not describe all possible interactions. Give your health care provider a list of all the medicines, herbs, non-prescription drugs, or dietary supplements you use. Also tell them if you smoke, drink alcohol, or use illegal drugs. Some items may interact with your medicine. What should I watch for while using this medicine? Visit your doctor for checks on your progress. This drug may make you feel generally unwell. This is not uncommon, as  chemotherapy can affect healthy cells as well as cancer cells. Report any side effects. Continue your course of treatment even though you feel ill unless your doctor tells you to stop. In some cases, you may be given additional medicines to help with side effects. Follow all directions for their use. Call your doctor or health care professional for advice if you get a fever, chills or sore throat, or other symptoms of a cold or flu. Do not treat yourself. This drug decreases your body's ability to fight infections. Try to avoid being around people who are sick. This medicine may increase your risk to bruise or bleed. Call your doctor or health care professional if you notice any unusual bleeding. Be careful brushing and flossing your teeth or using a toothpick because you may get an infection or bleed more easily. If you have any dental work done, tell your dentist you are receiving this medicine. Avoid taking products that contain aspirin, acetaminophen, ibuprofen, naproxen, or ketoprofen unless instructed by your doctor. These medicines may hide a fever. Do not become pregnant while taking this medicine. Women should inform their doctor if they wish to become pregnant or think they might be pregnant. There is a potential for serious side effects to an unborn child. Talk to your health care professional or pharmacist for more information. Do not breast-feed an infant while taking this medicine. Men should inform their doctor if they wish to father a child. This medicine may lower sperm counts. Do not treat diarrhea with over the counter products. Contact your doctor if you have diarrhea that lasts more than 2 days or if it is severe  and watery. This medicine can make you more sensitive to the sun. Keep out of the sun. If you cannot avoid being in the sun, wear protective clothing and use sunscreen. Do not use sun lamps or tanning beds/booths. What side effects may I notice from receiving this  medicine? Side effects that you should report to your doctor or health care professional as soon as possible: -allergic reactions like skin rash, itching or hives, swelling of the face, lips, or tongue -low blood counts - this medicine may decrease the number of white blood cells, red blood cells and platelets. You may be at increased risk for infections and bleeding. -signs of infection - fever or chills, cough, sore throat, pain or difficulty passing urine -signs of decreased platelets or bleeding - bruising, pinpoint red spots on the skin, black, tarry stools, blood in the urine -signs of decreased red blood cells - unusually weak or tired, fainting spells, lightheadedness -breathing problems -changes in vision -chest pain -mouth sores -nausea and vomiting -pain, swelling, redness at site where injected -pain, tingling, numbness in the hands or feet -redness, swelling, or sores on hands or feet -stomach pain -unusual bleeding Side effects that usually do not require medical attention (report to your doctor or health care professional if they continue or are bothersome): -changes in finger or toe nails -diarrhea -dry or itchy skin -hair loss -headache -loss of appetite -sensitivity of eyes to the light -stomach upset -unusually teary eyes This list may not describe all possible side effects. Call your doctor for medical advice about side effects. You may report side effects to FDA at 1-800-FDA-1088. Where should I keep my medicine? This drug is given in a hospital or clinic and will not be stored at home. NOTE: This sheet is a summary. It may not cover all possible information. If you have questions about this medicine, talk to your doctor, pharmacist, or health care provider.    2016, Elsevier/Gold Standard. (2007-10-08 13:53:16)

## 2015-11-27 NOTE — Progress Notes (Signed)
Ennio Woulard presents to have home infusion pump d/c'd and for port-a-cath deaccess with flush.  Portacath located left chest wall accessed with  H 20 needle.  Good blood return present. Portacath flushed with NS and 500U/36ml Heparin, and needle removed intact.  Procedure tolerated well and without incident.

## 2015-11-28 ENCOUNTER — Encounter (HOSPITAL_COMMUNITY): Payer: Self-pay | Admitting: *Deleted

## 2015-11-28 NOTE — Progress Notes (Signed)
Contacted pt's wife for chemo 24 hour f/u - states pt slept all day yesterday after being disconnected from infusion pump, and that he did not go to work today d/t extreme fatigue.  She says she will go home to check on him at lunchtime; instructed her to call with any questions or concerns after she sees him this afternoon, or anytime. Verbalizes understanding.

## 2015-11-29 ENCOUNTER — Encounter (HOSPITAL_COMMUNITY): Payer: Self-pay | Admitting: Hematology & Oncology

## 2015-11-29 NOTE — Assessment & Plan Note (Addendum)
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 S/P partial colectomy by Dr. Jenkins on 10/03/2015.  Now on FOLFOX adjuvant therapy beginning on 11/25/2015 with Oxaliplatin reduced by 25% after intolerance to Xelox on 10/25/2015.  Oncology history updated.  Labs were not performed today.  He notes some nausea Sunday-Tuesday.  He denies any vomiting.  It is improving as of today.  He is taking home anti-emetics.  He is getting Aloxi+Emend as pre-meds for chemotherapy.  Sample is given for [place].  This is to be applied Sunday PM x 7 days.  Hopefully this improves nausea for his next cycle.   He declines IV fluids today.  He is to call if he continues to have a difficulty with nausea and PO fluid intake so we can set him up for IV fluids and antiemetics.  He is educated on Oxalipltain-induced PN.  We may need to pre-emptively set him up for IV fluids +/- antiemetics the Monday following FOLFOX.  I broached this with the patient and he was noncommittal today.  We will discuss at next visit as well.  Return as scheduled for follow-up and cycle #3 of treatment.   

## 2015-11-29 NOTE — Progress Notes (Signed)
Derek Kaufmann, MD [place] [id number]  Adenocarcinoma of sigmoid colon [facility])  CURRENT THERAPY: FOLFOX beginning on 11/25/2015 with an Oxaliplatin dose reduction by 25% after 1 cycle of Xelox beginning on 02/04/6014 that was complicated by side effects.  INTERVAL HISTORY: Derek Rodriguez 55 y.o. 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 Arnoldo Morale.    Adenocarcinoma of sigmoid colon ([place])   09/09/2015 - 09/12/2015 Hospital Admission Diverticulitis of colon with perforation   09/12/2015 Imaging CT abd/pelvis- Continued severe inflammation of the proximal half of the sigmoid colon. There is a 2.6 cm oval low-attenuation structure extending between the posterior wall of the inflamed sigmoid colon and the dome of the bladder....   09/27/2015 Tumor Marker CEA: 2.5    10/03/2015 Procedure Partial colectomy, splenic flexure takedown by Dr. Arnoldo Morale   10/03/2015 Pathology Results FULL THICKNESS INVASIVE MODERATELY TO POORLY DIFFERENTIATED ADENOCARCINOMA WITH SURFACE ULCERATION. TUMOR INVADES THROUGH MUSCULARIS MUCOSA AND PERFORATES SEROSAL SURFACE. - TUMOR MEASURES 10.3 CM IN GREATEST DIMENSION. - EXTENSIVE LYMPH/VASCULAR INVASI   10/13/2015 Pathology Results MSI STABLE   10/20/2015 Imaging CT chest- Negative. No evidence of metastatic disease or other active disease within the thorax.   11/04/2015 - 11/04/2015 Chemotherapy CapeOX   11/12/2015 Miscellaneous ED visit with nausea and decreased po intake   11/18/2015 Procedure Port-A-Cath insertion by Dr. Arnoldo Morale   11/25/2015 Treatment Plan Change From Xelox to FOLFOX   11/25/2015 -  Chemotherapy FOLFOX    He DID NOT tolerate his first cycle, and only, cycle of CapeOx with intolerance to capecitabine.  This resulted in a change in therapy to FOLFOX, which we originally recommended given its better side effect/toxicity profile and more predictable side  effects/management.  He notes that this treatment was better, but not "good."  He notes that beginning on Sunday evening, he started to become nauseated.  He denies any vomiting.  During this time, he also became very fatigued and tired.  He spent most of his time in bed.  He actually called out of work on Monday and Tuesday.  He also did not eat well as a result.  He notes that his appetite is slowly improving today along with his nausea.  He notes difficulty consuming enough H2O as it makes him nauseated.  He is sipping H2O and not consuming a large amount in short amount of time.  All fluids are resulting in increased nausea as he has tried other liquids.  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  This is limited to fingertip and toes at this time.  He is provided education regarding Oxaiplatin-induced PN.  There is a documented weight loss of 10 lbs since 6/9.  On further review, I am not sure this is accurate as his previous weight was much closer to his current weight (1lb weight loss).  Review of Systems  Constitutional: Negative for fever and chills.  HENT: Negative.   Eyes: Negative.   Respiratory: Negative.   Cardiovascular: Negative.   Gastrointestinal: Positive for nausea (Sunday-Tuesday.  Improved today.). Negative for vomiting, diarrhea and constipation.  Genitourinary: Negative.   Musculoskeletal: Negative.   Skin: Negative.   Neurological: Positive for tingling (Oxaliplatin-induced peripheral neuropathy.  Grade 1) and weakness.  Endo/Heme/Allergies: Negative.   Psychiatric/Behavioral: Negative.     Past Medical History  Diagnosis Date  . Diverticulitis   . Hypertension   . Arthritis   . Adenocarcinoma of sigmoid  colon ([place]) 10/18/2015    Past Surgical History  Procedure Laterality Date  . Partial colectomy N/A 10/03/2015    Procedure: PARTIAL COLECTOMY;  Surgeon: Aviva Signs, MD;  Location: [facility];  Service: General;  Laterality: N/A;  .  Portacath placement N/A 11/18/2015    Procedure: INSERTION PORT-A-CATH LEFT SUBCLAVIAN;  Surgeon: Aviva Signs, MD;  Location: [facility];  Service: General;  Laterality: N/A;    Family History  Problem Relation Age of Onset  . Diabetes Father     Social History   Social History  . Marital Status: Married    Spouse Name: N/A  . Number of Children: N/A  . Years of Education: N/A   Social History Main Topics  . Smoking status: Former Smoker    Types: Cigars, Pipe    Quit date: 09/26/1985  . Smokeless tobacco: Former Systems developer    Types: Snuff    Quit date: 06/29/2015  . Alcohol Use: No     Comment: occ  . Drug Use: No  . Sexual Activity: Yes    Birth Control/ Protection: None   Other Topics Concern  . None   Social History Narrative     PHYSICAL EXAMINATION  ECOG PERFORMANCE STATUS: 1 - Symptomatic but completely ambulatory  Filed Vitals:   11/30/15 0954  BP: 113/85  Pulse: 95  Temp: 98.1 F (36.7 C)  Resp: 16    GENERAL:alert, no distress, well nourished, well developed, comfortable, cooperative and accompanied by his wife. SKIN: skin color, texture, turgor are normal, no rashes or significant lesions HEAD: Normocephalic, No masses, lesions, tenderness or abnormalities EYES: normal, EOMI, Conjunctiva are pink and non-injected EARS: External ears normal OROPHARYNX:lips, buccal mucosa, and tongue normal and mucous membranes are moist  NECK: supple, trachea midline LYMPH:  no palpable lymphadenopathy BREAST:not examined LUNGS: clear to auscultation  HEART: regular rate & rhythm, no murmurs, no gallops, S1 normal and S2 normal ABDOMEN:abdomen soft and normal bowel sounds BACK: Back symmetric, no curvature. EXTREMITIES:less then 2 second capillary refill, no joint deformities, effusion, or inflammation, no skin discoloration, no clubbing, no cyanosis  NEURO: alert & oriented x 3 with fluent speech, no focal motor/sensory deficits, gait normal   LABORATORY DATA: CBC     Component Value Date/Time   WBC 4.2 11/25/2015 0910   RBC 4.54 11/25/2015 0910   HGB 12.1* 11/25/2015 0910   HCT 36.2* 11/25/2015 0910   PLT 241 11/25/2015 0910   MCV 79.7 11/25/2015 0910   MCH 26.7 11/25/2015 0910   MCHC 33.4 11/25/2015 0910   RDW 15.7* 11/25/2015 0910   LYMPHSABS 1.2 11/25/2015 0910   MONOABS 0.6 11/25/2015 0910   EOSABS 0.1 11/25/2015 0910   BASOSABS 0.0 11/25/2015 0910      Chemistry      Component Value Date/Time   NA 135 11/25/2015 0910   K 3.9 11/25/2015 0910   CL 106 11/25/2015 0910   CO2 23 11/25/2015 0910   BUN 17 11/25/2015 0910   CREATININE 0.86 11/25/2015 0910      Component Value Date/Time   CALCIUM 8.6* 11/25/2015 0910   ALKPHOS 72 11/25/2015 0910   AST 19 11/25/2015 0910   ALT 15* 11/25/2015 0910   BILITOT 0.4 11/25/2015 0910      Lab Results  Component Value Date   CEA 2.5 09/27/2015     PENDING LABS:   RADIOGRAPHIC STUDIES:  Dg Chest Port 1 View  11/18/2015  CLINICAL DATA:  Placement of left chest Port-A-Cath. EXAM: PORTABLE CHEST 1  VIEW COMPARISON:  11/09/2015 FINDINGS: A left subclavian Port-A-Cath has been placed. The tip is in the upper SVC region. Negative for a pneumothorax. Both lungs are clear. Heart and mediastinum are within normal limits. The trachea is midline. No acute bone abnormality. IMPRESSION: Placement a left subclavian Port-A-Cath without pneumothorax. Catheter tip in the upper SVC region. Electronically Signed   By: Markus Daft M.D.   On: 11/18/2015 10:04   Dg Abd Acute W/chest  11/09/2015  CLINICAL DATA:  Nausea and weakness after chemotherapy treatment. EXAM: DG ABDOMEN ACUTE W/ 1V CHEST COMPARISON:  None. FINDINGS: There is no evidence of dilated bowel loops or free intraperitoneal air. No radiopaque calculi or other significant radiographic abnormality is seen. Heart size and mediastinal contours are within normal limits. Both lungs are clear. IMPRESSION: Negative abdominal radiographs.  No acute  cardiopulmonary disease. Electronically Signed   By: Abelardo Diesel M.D.   On: 11/09/2015 20:18   Dg C-arm 1-60 Min-no Report  11/18/2015  CLINICAL DATA: insertion portacath C-ARM 1-60 MINUTES Fluoroscopy was utilized by the requesting physician.  No radiographic interpretation.     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 S/P partial colectomy by Dr. Arnoldo Morale on 10/03/2015.  Now on FOLFOX adjuvant therapy beginning on 11/25/2015 with Oxaliplatin reduced by 25% after intolerance to Xelox on 10/25/2015.  Oncology history updated.  Labs were not performed today.  He notes some nausea Sunday-Tuesday.  He denies any vomiting.  It is improving as of today.  He is taking home anti-emetics.  He is getting Aloxi+Emend as pre-meds for chemotherapy.  Sample is given for [place].  This is to be applied Sunday PM x 7 days.  Hopefully this improves nausea for his next cycle.   He declines IV fluids today.  He is to call if he continues to have a difficulty with nausea and PO fluid intake so we can set him up for IV fluids and antiemetics.  He is educated on Oxalipltain-induced PN.  We may need to pre-emptively set him up for IV fluids +/- antiemetics the Monday following FOLFOX.  I broached this with the patient and he was noncommittal today.  We will discuss at next visit as well.  Return as scheduled for follow-up and cycle #3 of treatment.      ORDERS PLACED FOR THIS ENCOUNTER: No orders of the defined types were placed in this encounter.    MEDICATIONS PRESCRIBED THIS ENCOUNTER: No orders of the defined types were placed  in this encounter.    THERAPY PLAN:  Continue with adjuvant treatment as planned.  All questions were answered. The patient knows to call the clinic with any problems, questions or concerns. We can certainly see the patient much sooner if necessary.  Patient and plan discussed with Dr. Ancil Linsey and she is in agreement with the aforementioned.   This note is electronically signed by: Doy Mince 11/30/2015 1:22 PM

## 2015-11-30 ENCOUNTER — Encounter (HOSPITAL_COMMUNITY): Payer: Self-pay | Admitting: Oncology

## 2015-11-30 ENCOUNTER — Encounter (HOSPITAL_BASED_OUTPATIENT_CLINIC_OR_DEPARTMENT_OTHER): Payer: BLUE CROSS/BLUE SHIELD | Admitting: Oncology

## 2015-11-30 VITALS — BP 113/85 | HR 95 | Temp 98.1°F | Resp 16 | Wt 228.4 lb

## 2015-11-30 DIAGNOSIS — R110 Nausea: Secondary | ICD-10-CM

## 2015-11-30 DIAGNOSIS — C187 Malignant neoplasm of sigmoid colon: Secondary | ICD-10-CM

## 2015-11-30 NOTE — Patient Instructions (Signed)
[place] at [facility] Discharge Instructions  RECOMMENDATIONS MADE BY THE CONSULTANT AND ANY TEST RESULTS WILL BE SENT TO YOUR REFERRING PHYSICIAN.  Call if you need IV Fluids [place] sample... Apply on Sunday late PM and keep it on for 7 days. Return to Clinic as scheduled.  Thank you for choosing [place] at [facility] to provide your oncology and hematology care.  To afford each patient quality time with our provider, please arrive at least 15 minutes before your scheduled appointment time.   Beginning January 23rd 2017 lab work for the [facility] will be done in the  Main lab at [facility] on 1st floor. If you have a lab appointment with the [facility] please come in thru the  Main Entrance and check in at the main information desk  You need to re-schedule your appointment should you arrive 10 or more minutes late.  We strive to give you quality time with our providers, and arriving late affects you and other patients whose appointments are after yours.  Also, if you no show three or more times for appointments you may be dismissed from the clinic at the providers discretion.     Again, thank you for choosing [facility].  Our hope is that these requests will decrease the amount of time that you wait before being seen by our physicians.       _____________________________________________________________  Should you have questions after your visit to [facility], please contact our office at [phone] between the hours of 8:30 a.m. and 4:30 p.m.  Voicemails left after 4:30 p.m. will not be returned until the following business day.  For prescription refill requests, have your pharmacy contact our office.         Resources For Cancer Patients and their Caregivers ? American Cancer Society: Can assist with transportation, wigs, general needs, runs Look Good Feel Better.         806 589 8781 ? Cancer Care: Provides financial assistance, online support groups, medication/co-pay assistance.  1-800-813-HOPE 351-541-1484) ? [place] Assists [place] cancer patients and their families through emotional , educational and financial support.  570-576-2209 ? [place] DSS Where to apply for food stamps, Medicaid and utility assistance. 986-136-9748 ? [facility]: Transportation to medical appointments. 319-221-0323 ? Social Security Administration: May apply for disability if have a Stage IV cancer. 423-134-3376 5755279067 ? [employer], Disability and Transit Services: Assists with nutrition, care and transit needs. [place] Support Programs: @10RELATIVEDAYS @ > Cancer Support Group  2nd Tuesday of the month 1pm-2pm, Journey Room  > Creative Journey  3rd Tuesday of the month 1130am-1pm, Journey Room  > Look Good Feel Better  1st Wednesday of the month 10am-12 noon, Journey Room (Call [place] to register [phone])

## 2015-12-02 ENCOUNTER — Telehealth (HOSPITAL_COMMUNITY): Payer: Self-pay | Admitting: *Deleted

## 2015-12-02 ENCOUNTER — Ambulatory Visit (HOSPITAL_COMMUNITY): Payer: BLUE CROSS/BLUE SHIELD | Admitting: Hematology & Oncology

## 2015-12-02 ENCOUNTER — Inpatient Hospital Stay (HOSPITAL_COMMUNITY): Payer: BLUE CROSS/BLUE SHIELD

## 2015-12-02 NOTE — Telephone Encounter (Signed)
Patient got real hot and dizzy @ work today. Pt is going to need STD papers filled out. Pt had to leave work today d/t symptoms.

## 2015-12-09 ENCOUNTER — Encounter (HOSPITAL_BASED_OUTPATIENT_CLINIC_OR_DEPARTMENT_OTHER): Payer: BLUE CROSS/BLUE SHIELD

## 2015-12-09 ENCOUNTER — Encounter (HOSPITAL_COMMUNITY): Payer: Self-pay | Admitting: Oncology

## 2015-12-09 ENCOUNTER — Encounter (HOSPITAL_BASED_OUTPATIENT_CLINIC_OR_DEPARTMENT_OTHER): Payer: BLUE CROSS/BLUE SHIELD | Admitting: Oncology

## 2015-12-09 VITALS — BP 118/75 | HR 70 | Temp 97.9°F | Resp 18

## 2015-12-09 VITALS — BP 124/79 | HR 71 | Temp 98.1°F | Resp 18 | Wt 234.2 lb

## 2015-12-09 DIAGNOSIS — C779 Secondary and unspecified malignant neoplasm of lymph node, unspecified: Secondary | ICD-10-CM | POA: Diagnosis not present

## 2015-12-09 DIAGNOSIS — R112 Nausea with vomiting, unspecified: Secondary | ICD-10-CM

## 2015-12-09 DIAGNOSIS — C187 Malignant neoplasm of sigmoid colon: Secondary | ICD-10-CM | POA: Diagnosis not present

## 2015-12-09 DIAGNOSIS — Z5111 Encounter for antineoplastic chemotherapy: Secondary | ICD-10-CM | POA: Diagnosis not present

## 2015-12-09 LAB — CBC WITH DIFFERENTIAL/PLATELET
BASOS ABS: 0.1 10*3/uL (ref 0.0–0.1)
Basophils Relative: 1 %
Eosinophils Absolute: 0.1 10*3/uL (ref 0.0–0.7)
Eosinophils Relative: 2 %
HEMATOCRIT: 39.7 % (ref 39.0–52.0)
Hemoglobin: 13.4 g/dL (ref 13.0–17.0)
LYMPHS PCT: 31 %
Lymphs Abs: 1.6 10*3/uL (ref 0.7–4.0)
MCH: 26.9 pg (ref 26.0–34.0)
MCHC: 33.8 g/dL (ref 30.0–36.0)
MCV: 79.7 fL (ref 78.0–100.0)
Monocytes Absolute: 0.5 10*3/uL (ref 0.1–1.0)
Monocytes Relative: 10 %
NEUTROS ABS: 2.9 10*3/uL (ref 1.7–7.7)
NEUTROS PCT: 57 %
PLATELETS: 139 10*3/uL — AB (ref 150–400)
RBC: 4.98 MIL/uL (ref 4.22–5.81)
RDW: 15.9 % — ABNORMAL HIGH (ref 11.5–15.5)
WBC: 5.2 10*3/uL (ref 4.0–10.5)

## 2015-12-09 LAB — COMPREHENSIVE METABOLIC PANEL
ALT: 17 U/L (ref 17–63)
AST: 21 U/L (ref 15–41)
Albumin: 3.6 g/dL (ref 3.5–5.0)
Alkaline Phosphatase: 76 U/L (ref 38–126)
Anion gap: 7 (ref 5–15)
BILIRUBIN TOTAL: 0.4 mg/dL (ref 0.3–1.2)
BUN: 11 mg/dL (ref 6–20)
CHLORIDE: 105 mmol/L (ref 101–111)
CO2: 21 mmol/L — ABNORMAL LOW (ref 22–32)
CREATININE: 0.9 mg/dL (ref 0.61–1.24)
Calcium: 8.3 mg/dL — ABNORMAL LOW (ref 8.9–10.3)
GFR calc Af Amer: >60 mL/min (ref >60)
Glucose, Bld: 148 mg/dL — ABNORMAL HIGH (ref 65–99)
Potassium: 3.4 mmol/L — ABNORMAL LOW (ref 3.5–5.1)
Sodium: 133 mmol/L — ABNORMAL LOW (ref 135–145)
Total Protein: 6.3 g/dL — ABNORMAL LOW (ref 6.5–8.1)

## 2015-12-09 MED ORDER — SODIUM CHLORIDE 0.9 % IV SOLN
2400.0000 mg/m2 | INTRAVENOUS | Status: AC
  Administered 2015-12-09: 5500 mg via INTRAVENOUS
  Filled 2015-12-09: qty 100

## 2015-12-09 MED ORDER — PALONOSETRON HCL INJECTION 0.25 MG/5ML
0.2500 mg | Freq: Once | INTRAVENOUS | Status: AC
  Administered 2015-12-09: 0.25 mg via INTRAVENOUS
  Filled 2015-12-09: qty 5

## 2015-12-09 MED ORDER — FLUOROURACIL CHEMO INJECTION 2.5 GM/50ML
400.0000 mg/m2 | Freq: Once | INTRAVENOUS | Status: AC
  Administered 2015-12-09: 900 mg via INTRAVENOUS
  Filled 2015-12-09: qty 18

## 2015-12-09 MED ORDER — LEUCOVORIN CALCIUM INJECTION 350 MG
400.0000 mg/m2 | Freq: Once | INTRAVENOUS | Status: AC
  Administered 2015-12-09: 920 mg via INTRAVENOUS
  Filled 2015-12-09: qty 46

## 2015-12-09 MED ORDER — OXALIPLATIN CHEMO INJECTION 100 MG/20ML
85.0000 mg/m2 | Freq: Once | INTRAVENOUS | Status: AC
  Administered 2015-12-09: 195 mg via INTRAVENOUS
  Filled 2015-12-09: qty 39

## 2015-12-09 MED ORDER — DEXTROSE 5 % IV SOLN
Freq: Once | INTRAVENOUS | Status: AC
  Administered 2015-12-09: 10:00:00 via INTRAVENOUS

## 2015-12-09 MED ORDER — LORAZEPAM 2 MG/ML IJ SOLN
0.5000 mg | Freq: Once | INTRAMUSCULAR | Status: AC
  Administered 2015-12-09: 0.5 mg via INTRAVENOUS
  Filled 2015-12-09: qty 1

## 2015-12-09 MED ORDER — HEPARIN SOD (PORK) LOCK FLUSH 100 UNIT/ML IV SOLN
500.0000 [IU] | Freq: Once | INTRAVENOUS | Status: DC | PRN
  Filled 2015-12-09: qty 5

## 2015-12-09 MED ORDER — SODIUM CHLORIDE 0.9% FLUSH
10.0000 mL | INTRAVENOUS | Status: DC | PRN
Start: 2015-12-09 — End: 2015-12-11

## 2015-12-09 MED ORDER — SODIUM CHLORIDE 0.9 % IV SOLN
Freq: Once | INTRAVENOUS | Status: AC
  Administered 2015-12-09: 10:00:00 via INTRAVENOUS
  Filled 2015-12-09: qty 5

## 2015-12-09 NOTE — Progress Notes (Addendum)
Derek Kaufmann, MD [place] [address]  Adenocarcinoma of sigmoid colon [facility])  CURRENT THERAPY: FOLFOX beginning on 11/25/2015 with an Oxaliplatin dose reduction by 25% after 1 cycle of Xelox beginning on 0/63/0160 that was complicated by side effects.  INTERVAL HISTORY: Derek Rodriguez 55 y.o. 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 Arnoldo Morale.    Adenocarcinoma of sigmoid colon ([facility])   09/09/2015 - 09/12/2015 Hospital Admission Diverticulitis of colon with perforation   09/12/2015 Imaging CT abd/pelvis- Continued severe inflammation of the proximal half of the sigmoid colon. There is a 2.6 cm oval low-attenuation structure extending between the posterior wall of the inflamed sigmoid colon and the dome of the bladder....   09/27/2015 Tumor Marker CEA: 2.5    10/03/2015 Procedure Partial colectomy, splenic flexure takedown by Dr. Arnoldo Morale   10/03/2015 Pathology Results FULL THICKNESS INVASIVE MODERATELY TO POORLY DIFFERENTIATED ADENOCARCINOMA WITH SURFACE ULCERATION. TUMOR INVADES THROUGH MUSCULARIS MUCOSA AND PERFORATES SEROSAL SURFACE. - TUMOR MEASURES 10.3 CM IN GREATEST DIMENSION. - EXTENSIVE LYMPH/VASCULAR INVASI   10/13/2015 Pathology Results MSI STABLE   10/20/2015 Imaging CT chest- Negative. No evidence of metastatic disease or other active disease within the thorax.   11/04/2015 - 11/04/2015 Chemotherapy [facility]   11/12/2015 Miscellaneous ED visit with nausea and decreased po intake   11/18/2015 Procedure Port-A-Cath insertion by Dr. Arnoldo Morale   11/25/2015 Treatment Plan Change From Xelox to FOLFOX   11/25/2015 -  Chemotherapy FOLFOX   This past week was better for him but not perfect. He notes that cold intolerance lasted approximately 10 days. He admits to drinking plenty of fluid at home.  He did try to go back to work on Friday and unfortunately left work early due to not feeling well and other signs  and symptoms associated with dehydration. He notes that Friday morning at work after being there for only a short time, his coworkers noted he didn't look well. He notes that it was very hot in the plant and he was profusely sweating as a result of that. He was escorted to the [facility] on site where he recovered with oxygen and air conditioning. He notes his blood pressure was taken and was at baseline. No laboratory work was done. He notes his big issue was dizziness that occurred suddenly.  He notes that the taste of foods have changed with chemotherapy. That too is recovering. He's gained 6 pounds since last week (11/30/2015).   Review of Systems  Constitutional: Positive for weight loss (Gained 6 lbs since 11/30/2015). Negative for fever and chills.  HENT: Negative.   Eyes: Negative.   Respiratory: Negative.   Cardiovascular: Negative.   Gastrointestinal: Positive for nausea (Sunday-Tuesday.  Improved today.). Negative for vomiting, diarrhea and constipation.  Genitourinary: Negative.   Musculoskeletal: Negative.   Skin: Negative.   Neurological: Positive for dizziness (Not present, but occured at work on Friday) and weakness. Negative for tingling.  Endo/Heme/Allergies: Negative.   Psychiatric/Behavioral: Negative.     Past Medical History  Diagnosis Date  . Diverticulitis   . Hypertension   . Arthritis   . Adenocarcinoma of sigmoid colon ([place]) 10/18/2015    Past Surgical History  Procedure Laterality Date  . Partial colectomy N/A 10/03/2015    Procedure: PARTIAL COLECTOMY;  Surgeon: Aviva Signs, MD;  Location: [facility];  Service: General;  Laterality: N/A;  . Portacath placement N/A 11/18/2015    Procedure: INSERTION  PORT-A-CATH LEFT SUBCLAVIAN;  Surgeon: Aviva Signs, MD;  Location: [facility];  Service: General;  Laterality: N/A;    Family History  Problem Relation Age of Onset  . Diabetes Father     Social History   Social History  . Marital Status: Married     Spouse Name: N/A  . Number of Children: N/A  . Years of Education: N/A   Social History Main Topics  . Smoking status: Former Smoker    Types: Cigars, Pipe    Quit date: 09/26/1985  . Smokeless tobacco: Former Systems developer    Types: Snuff    Quit date: 06/29/2015  . Alcohol Use: No     Comment: occ  . Drug Use: No  . Sexual Activity: Yes    Birth Control/ Protection: None   Other Topics Concern  . None   Social History Narrative     PHYSICAL EXAMINATION  ECOG PERFORMANCE STATUS: 1 - Symptomatic but completely ambulatory  Filed Vitals:   12/09/15 0823  BP: 124/79  Pulse: 71  Temp: 98.1 F (36.7 C)  Resp: 18    GENERAL:alert, no distress, well nourished, well developed, comfortable, cooperative and accompanied by his wife, in chemo-bed. SKIN: skin color, texture, turgor are normal, no rashes or significant lesions HEAD: Normocephalic, No masses, lesions, tenderness or abnormalities EYES: normal, EOMI, Conjunctiva are pink and non-injected EARS: External ears normal OROPHARYNX:lips, buccal mucosa, and tongue normal and mucous membranes are moist  NECK: supple, trachea midline LYMPH:  no palpable lymphadenopathy BREAST:not examined LUNGS: clear to auscultation  HEART: regular rate & rhythm, no murmurs, no gallops, S1 normal and S2 normal ABDOMEN:abdomen soft and normal bowel sounds BACK: Back symmetric, no curvature. EXTREMITIES:less then 2 second capillary refill, no joint deformities, effusion, or inflammation, no skin discoloration, no clubbing, no cyanosis  NEURO: alert & oriented x 3 with fluent speech, no focal motor/sensory deficits, gait normal   LABORATORY DATA: CBC    Component Value Date/Time   WBC 5.2 12/09/2015 0820   RBC 4.98 12/09/2015 0820   HGB 13.4 12/09/2015 0820   HCT 39.7 12/09/2015 0820   PLT 139* 12/09/2015 0820   MCV 79.7 12/09/2015 0820   MCH 26.9 12/09/2015 0820   MCHC 33.8 12/09/2015 0820   RDW 15.9* 12/09/2015 0820   LYMPHSABS 1.6  12/09/2015 0820   MONOABS 0.5 12/09/2015 0820   EOSABS 0.1 12/09/2015 0820   BASOSABS 0.1 12/09/2015 0820      Chemistry      Component Value Date/Time   NA 133* 12/09/2015 0820   K 3.4* 12/09/2015 0820   CL 105 12/09/2015 0820   CO2 21* 12/09/2015 0820   BUN 11 12/09/2015 0820   CREATININE 0.90 12/09/2015 0820      Component Value Date/Time   CALCIUM 8.3* 12/09/2015 0820   ALKPHOS 76 12/09/2015 0820   AST 21 12/09/2015 0820   ALT 17 12/09/2015 0820   BILITOT 0.4 12/09/2015 0820      Lab Results  Component Value Date   CEA 2.5 09/27/2015     PENDING LABS:   RADIOGRAPHIC STUDIES:  Dg Chest Port 1 View  11/18/2015  CLINICAL DATA:  Placement of left chest Port-A-Cath. EXAM: PORTABLE CHEST 1 VIEW COMPARISON:  11/09/2015 FINDINGS: A left subclavian Port-A-Cath has been placed. The tip is in the upper SVC region. Negative for a pneumothorax. Both lungs are clear. Heart and mediastinum are within normal limits. The trachea is midline. No acute bone abnormality. IMPRESSION: Placement a left subclavian Port-A-Cath  without pneumothorax. Catheter tip in the upper SVC region. Electronically Signed   By: Markus Daft M.D.   On: 11/18/2015 10:04   Dg Abd Acute W/chest  11/09/2015  CLINICAL DATA:  Nausea and weakness after chemotherapy treatment. EXAM: DG ABDOMEN ACUTE W/ 1V CHEST COMPARISON:  None. FINDINGS: There is no evidence of dilated bowel loops or free intraperitoneal air. No radiopaque calculi or other significant radiographic abnormality is seen. Heart size and mediastinal contours are within normal limits. Both lungs are clear. IMPRESSION: Negative abdominal radiographs.  No acute cardiopulmonary disease. Electronically Signed   By: Abelardo Diesel M.D.   On: 11/09/2015 20:18   Dg C-arm 1-60 Min-no Report  11/18/2015  CLINICAL DATA: insertion portacath C-ARM 1-60 MINUTES Fluoroscopy was utilized by the requesting physician.  No radiographic interpretation.     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 S/P partial colectomy by Dr. Arnoldo Morale on 10/03/2015.  Now on FOLFOX adjuvant therapy beginning on 11/25/2015 with Oxaliplatin reduced by 25% after intolerance to Xelox on 10/25/2015.  Oncology history updated.  Pre-treatment labs as ordered: CBC diff, CMET.  I personally reviewed and went over laboratory results with the patient.  The results are noted within this dictation.  Labs satisfy treatment parameters today. Of note he has a mild thrombocytopenia which we will need to watch moving forward. I'll discuss with Dr. Whitney Muse to see if she wants to preemptively dose reduce his 5-FU. Hypokalemia is also noted today and therefore we will replace here in the clinic.  We may need to pre-emptively set him up for IV fluids +/- antiemetics the Monday following FOLFOX.  I broached this with the patient and he was noncommittal today.  Today, he refuses to have this set-up as he thinks he can make it without IV fluids.  He is applying for short-term disability beginning on 12/02/2015.  Not being in work, he thinks he can manage his hydration and symptoms at home.  He knows to call us if he has any issues.  He reports that he went to work last Friday and was discharged from work due to not feeling well and unable to work his shift.  He notes that Friday AM, he was there for only a short while until his co-workers noted that he did not feel.  He was escorted to the [facility] on site. He notes his blood pressure was good. He was given oxygen. He recovered in the air-conditioning over an hour to an released back  home. No laboratory work was performed at that time.  He had difficulty with nausea and vomiting with his previous cycle. We have increased his IV antiemetics on day of treatment to include Aloxi and amend. Additionally, he had difficulty with delayed nausea. As result, he's been given a Sancuso patch sample and he will place this on Sunday. Depending on effectiveness, we can consider this moving forward. Another option is Varubi.    Since he is taking himself out of work, we will alter his treatment schedule in order to eliminate a Sunday pump D/C (as this was only being done to accommodate his work schedule).    Return as scheduled for follow-up and cycle #  3 of treatment.  He knows to call us with any questions, concerns, or issues.    ORDERS PLACED FOR THIS ENCOUNTER: No orders of the defined types were placed in this encounter.    MEDICATIONS PRESCRIBED THIS ENCOUNTER: No orders of the defined types were placed in this encounter.    THERAPY PLAN:  Continue with adjuvant treatment as planned.  All questions were answered. The patient knows to call the clinic with any problems, questions or concerns. We can certainly see the patient much sooner if necessary.  Patient and plan discussed with Dr. Ancil Linsey and she is in agreement with the aforementioned.   This note is electronically signed by: Doy Mince 12/09/2015 2:39 PM

## 2015-12-09 NOTE — Assessment & Plan Note (Addendum)
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 S/P partial colectomy by Dr. Arnoldo Morale on 10/03/2015.  Now on FOLFOX adjuvant therapy beginning on 11/25/2015 with Oxaliplatin reduced by 25% after intolerance to Xelox on 10/25/2015.  Oncology history updated.  Pre-treatment labs as ordered: CBC diff, CMET.  I personally reviewed and went over laboratory results with the patient.  The results are noted within this dictation.  Labs satisfy treatment parameters today. Of note he has a mild thrombocytopenia which we will need to watch moving forward. I'll discuss with Dr. Whitney Muse to see if she wants to preemptively dose reduce his 5-FU. Hypokalemia is also noted today and therefore we will replace here in the clinic.  We may need to pre-emptively set him up for IV fluids +/- antiemetics the Monday following FOLFOX.  I broached this with the patient and he was noncommittal today.  Today, he refuses to have this set-up as he thinks he can make it without IV fluids.  He is applying for short-term disability beginning on 12/02/2015.  Not being in work, he thinks he can manage his hydration and symptoms at home.  He knows to call us if he has any issues.  He reports that he went to work last Friday and was discharged from work due to not feeling well and unable to work his shift.  He notes that Friday AM, he was there for only a short while until his co-workers noted that he did not feel.  He was escorted to the [facility] on site. He notes his blood pressure was good. He was given oxygen. He recovered in the air-conditioning over an hour to an released back home. No laboratory work was performed at that time.  He had  difficulty with nausea and vomiting with his previous cycle. We have increased his IV antiemetics on day of treatment to include Aloxi and amend. Additionally, he had difficulty with delayed nausea. As result, he's been given a Sancuso patch sample and he will place this on Sunday. Depending on effectiveness, we can consider this moving forward. Another option is Varubi.    Since he is taking himself out of work, we will alter his treatment schedule in order to eliminate a Sunday pump D/C (as this was only being done to accommodate his work schedule).    Return as scheduled for follow-up and cycle #3 of treatment.  He knows to call us with any questions, concerns, or issues.

## 2015-12-09 NOTE — Patient Instructions (Signed)
[place] at [facility] Discharge Instructions  RECOMMENDATIONS MADE BY THE CONSULTANT AND ANY TEST RESULTS WILL BE SENT TO YOUR REFERRING PHYSICIAN.  You were seen by Kirby Crigler, PA today.   Start Sancuso patch on Sunday (to help reduce nausea).  Call the clinic with any questions or concerns.   Follow-up at clinic in 2 weeks as scheduled.    Thank you for choosing [place] at [facility] to provide your oncology and hematology care.  To afford each patient quality time with our provider, please arrive at least 15 minutes before your scheduled appointment time.   Beginning January 23rd 2017 lab work for the [facility] will be done in the  Main lab at [facility] on 1st floor. If you have a lab appointment with the [facility] please come in thru the  Main Entrance and check in at the main information desk  You need to re-schedule your appointment should you arrive 10 or more minutes late.  We strive to give you quality time with our providers, and arriving late affects you and other patients whose appointments are after yours.  Also, if you no show three or more times for appointments you may be dismissed from the clinic at the providers discretion.     Again, thank you for choosing [facility].  Our hope is that these requests will decrease the amount of time that you wait before being seen by our physicians.       _____________________________________________________________  Should you have questions after your visit to [facility], please contact our office at [phone] between the hours of 8:30 a.m. and 4:30 p.m.  Voicemails left after 4:30 p.m. will not be returned until the following business day.  For prescription refill requests, have your pharmacy contact our office.         Resources For Cancer Patients and their Caregivers ? American Cancer Society: Can assist with transportation,  wigs, general needs, runs Look Good Feel Better.        7046484121 ? Cancer Care: Provides financial assistance, online support groups, medication/co-pay assistance.  1-800-813-HOPE 3852588288) ? [place] Assists [place] cancer patients and their families through emotional , educational and financial support.  507-186-4653 ? [place] DSS Where to apply for food stamps, Medicaid and utility assistance. 351-270-5184 ? RCATS: Transportation to medical appointments. 705 526 6256 ? Social Security Administration: May apply for disability if have a Stage IV cancer. ([phone] 260-158-1018 ? [employer], Disability and Transit Services: Assists with nutrition, care and transit needs. [place] Support Programs: @10RELATIVEDAYS @ > Cancer Support Group  2nd Tuesday of the month 1pm-2pm, Journey Room  > Creative Journey  3rd Tuesday of the month 1130am-1pm, Journey Room  > Look Good Feel Better  1st Wednesday of the month 10am-12 noon, Journey Room (Call [place] to register [phone])

## 2015-12-11 ENCOUNTER — Encounter (HOSPITAL_COMMUNITY): Payer: Self-pay

## 2015-12-11 ENCOUNTER — Encounter (HOSPITAL_BASED_OUTPATIENT_CLINIC_OR_DEPARTMENT_OTHER): Payer: BLUE CROSS/BLUE SHIELD

## 2015-12-11 VITALS — BP 118/80 | HR 54 | Temp 97.9°F | Resp 18

## 2015-12-11 DIAGNOSIS — C187 Malignant neoplasm of sigmoid colon: Secondary | ICD-10-CM | POA: Diagnosis not present

## 2015-12-11 MED ORDER — HEPARIN SOD (PORK) LOCK FLUSH 100 UNIT/ML IV SOLN
500.0000 [IU] | Freq: Once | INTRAVENOUS | Status: AC | PRN
Start: 2015-12-11 — End: 2015-12-11
  Administered 2015-12-11: 500 [IU]

## 2015-12-11 MED ORDER — SODIUM CHLORIDE 0.9% FLUSH
10.0000 mL | INTRAVENOUS | Status: DC | PRN
  Administered 2015-12-11: 10 mL
  Filled 2015-12-11: qty 10

## 2015-12-11 NOTE — Progress Notes (Signed)
Derek Rodriguez presents to have home infusion pump d/c'd and for port-a-cath deaccess with flush.  Portacath located left chest wall accessed with  H 20 needle.  Good blood return present. Portacath flushed with NS and 500U/28ml Heparin, and needle removed intact.  Procedure tolerated well and without incident.

## 2015-12-11 NOTE — Patient Instructions (Signed)
[place] at [facility] Discharge Instructions  RECOMMENDATIONS MADE BY THE CONSULTANT AND ANY TEST RESULTS WILL BE SENT TO YOUR REFERRING PHYSICIAN.  Pump removal and port flush today. Return as scheduled for chemotherapy and office visit. Use your nausea medications as prescribed.  Call the clinic should you have any questions or concerns prior to your next visit.  Thank you for choosing [place] at [facility] to provide your oncology and hematology care.  To afford each patient quality time with our provider, please arrive at least 15 minutes before your scheduled appointment time.   Beginning January 23rd 2017 lab work for the [facility] will be done in the  Main lab at [facility] on 1st floor. If you have a lab appointment with the [facility] please come in thru the  Main Entrance and check in at the main information desk  You need to re-schedule your appointment should you arrive 10 or more minutes late.  We strive to give you quality time with our providers, and arriving late affects you and other patients whose appointments are after yours.  Also, if you no show three or more times for appointments you may be dismissed from the clinic at the providers discretion.     Again, thank you for choosing [facility].  Our hope is that these requests will decrease the amount of time that you wait before being seen by our physicians.       _____________________________________________________________  Should you have questions after your visit to [facility], please contact our office at [phone] between the hours of 8:30 a.m. and 4:30 p.m.  Voicemails left after 4:30 p.m. will not be returned until the following business day.  For prescription refill requests, have your pharmacy contact our office.         Resources For Cancer Patients and their Caregivers ? American Cancer Society: Can assist with  transportation, wigs, general needs, runs Look Good Feel Better.        360-489-9391 ? Cancer Care: Provides financial assistance, online support groups, medication/co-pay assistance.  1-800-813-HOPE 7142340516) ? [place] Assists [place] cancer patients and their families through emotional , educational and financial support.  769-070-5968 ? [place] DSS Where to apply for food stamps, Medicaid and utility assistance. (719)184-5688 ? RCATS: Transportation to medical appointments. (939)334-4212 ? Social Security Administration: May apply for disability if have a Stage IV cancer. 684-195-8789 301-158-5503 ? [employer], Disability and Transit Services: Assists with nutrition, care and transit needs. [place] Support Programs: @10RELATIVEDAYS @ > Cancer Support Group  2nd Tuesday of the month 1pm-2pm, Journey Room  > Creative Journey  3rd Tuesday of the month 1130am-1pm, Journey Room  > Look Good Feel Better  1st Wednesday of the month 10am-12 noon, Journey Room (Call [place] to register [phone])

## 2015-12-15 ENCOUNTER — Other Ambulatory Visit (HOSPITAL_COMMUNITY): Payer: Self-pay | Admitting: *Deleted

## 2015-12-15 DIAGNOSIS — F32A Depression, unspecified: Secondary | ICD-10-CM

## 2015-12-15 DIAGNOSIS — F329 Major depressive disorder, single episode, unspecified: Secondary | ICD-10-CM

## 2015-12-15 MED ORDER — ESCITALOPRAM OXALATE 20 MG PO TABS
ORAL_TABLET | ORAL | Status: DC
Start: 2015-12-15 — End: 2016-03-08

## 2015-12-16 ENCOUNTER — Encounter (HOSPITAL_COMMUNITY): Payer: Self-pay | Admitting: Oncology

## 2015-12-16 ENCOUNTER — Ambulatory Visit (HOSPITAL_COMMUNITY): Payer: BLUE CROSS/BLUE SHIELD | Admitting: Oncology

## 2015-12-16 ENCOUNTER — Inpatient Hospital Stay (HOSPITAL_COMMUNITY): Payer: BLUE CROSS/BLUE SHIELD

## 2015-12-16 ENCOUNTER — Other Ambulatory Visit (HOSPITAL_COMMUNITY): Payer: Self-pay | Admitting: Oncology

## 2015-12-19 ENCOUNTER — Other Ambulatory Visit (HOSPITAL_COMMUNITY): Payer: Self-pay | Admitting: Oncology

## 2015-12-23 ENCOUNTER — Ambulatory Visit (HOSPITAL_COMMUNITY): Payer: BLUE CROSS/BLUE SHIELD | Admitting: Hematology & Oncology

## 2015-12-23 ENCOUNTER — Encounter (HOSPITAL_COMMUNITY): Payer: BLUE CROSS/BLUE SHIELD | Attending: Oncology

## 2015-12-23 ENCOUNTER — Ambulatory Visit (HOSPITAL_COMMUNITY): Payer: BLUE CROSS/BLUE SHIELD

## 2015-12-23 ENCOUNTER — Inpatient Hospital Stay (HOSPITAL_COMMUNITY): Payer: BLUE CROSS/BLUE SHIELD

## 2015-12-23 VITALS — BP 135/87 | HR 72 | Temp 97.9°F | Resp 18 | Wt 233.6 lb

## 2015-12-23 DIAGNOSIS — C779 Secondary and unspecified malignant neoplasm of lymph node, unspecified: Secondary | ICD-10-CM | POA: Diagnosis not present

## 2015-12-23 DIAGNOSIS — Z5111 Encounter for antineoplastic chemotherapy: Secondary | ICD-10-CM

## 2015-12-23 DIAGNOSIS — Z833 Family history of diabetes mellitus: Secondary | ICD-10-CM | POA: Insufficient documentation

## 2015-12-23 DIAGNOSIS — Z9889 Other specified postprocedural states: Secondary | ICD-10-CM | POA: Diagnosis not present

## 2015-12-23 DIAGNOSIS — K5792 Diverticulitis of intestine, part unspecified, without perforation or abscess without bleeding: Secondary | ICD-10-CM | POA: Diagnosis not present

## 2015-12-23 DIAGNOSIS — C187 Malignant neoplasm of sigmoid colon: Secondary | ICD-10-CM | POA: Insufficient documentation

## 2015-12-23 DIAGNOSIS — M1990 Unspecified osteoarthritis, unspecified site: Secondary | ICD-10-CM | POA: Diagnosis not present

## 2015-12-23 DIAGNOSIS — D649 Anemia, unspecified: Secondary | ICD-10-CM | POA: Diagnosis not present

## 2015-12-23 DIAGNOSIS — Z79899 Other long term (current) drug therapy: Secondary | ICD-10-CM | POA: Insufficient documentation

## 2015-12-23 DIAGNOSIS — Z87891 Personal history of nicotine dependence: Secondary | ICD-10-CM | POA: Insufficient documentation

## 2015-12-23 DIAGNOSIS — I10 Essential (primary) hypertension: Secondary | ICD-10-CM | POA: Diagnosis not present

## 2015-12-23 LAB — COMPREHENSIVE METABOLIC PANEL
ALBUMIN: 3.5 g/dL (ref 3.5–5.0)
ALK PHOS: 71 U/L (ref 38–126)
ALT: 23 U/L (ref 17–63)
AST: 26 U/L (ref 15–41)
Anion gap: 5 (ref 5–15)
BUN: 12 mg/dL (ref 6–20)
CHLORIDE: 105 mmol/L (ref 101–111)
CO2: 24 mmol/L (ref 22–32)
CREATININE: 0.96 mg/dL (ref 0.61–1.24)
Calcium: 8.4 mg/dL — ABNORMAL LOW (ref 8.9–10.3)
GFR calc non Af Amer: >60 mL/min (ref >60)
GLUCOSE: 130 mg/dL — AB (ref 65–99)
Potassium: 3.7 mmol/L (ref 3.5–5.1)
SODIUM: 134 mmol/L — AB (ref 135–145)
Total Bilirubin: 0.4 mg/dL (ref 0.3–1.2)
Total Protein: 6.4 g/dL — ABNORMAL LOW (ref 6.5–8.1)

## 2015-12-23 LAB — CBC WITH DIFFERENTIAL/PLATELET
BASOS ABS: 0 10*3/uL (ref 0.0–0.1)
BASOS PCT: 1 %
EOS ABS: 0.2 10*3/uL (ref 0.0–0.7)
EOS PCT: 4 %
HCT: 38.8 % — ABNORMAL LOW (ref 39.0–52.0)
HEMOGLOBIN: 13.1 g/dL (ref 13.0–17.0)
Lymphocytes Relative: 36 %
Lymphs Abs: 1.3 10*3/uL (ref 0.7–4.0)
MCH: 26.8 pg (ref 26.0–34.0)
MCHC: 33.8 g/dL (ref 30.0–36.0)
MCV: 79.5 fL (ref 78.0–100.0)
Monocytes Absolute: 0.6 10*3/uL (ref 0.1–1.0)
Monocytes Relative: 16 %
NEUTROS PCT: 43 %
Neutro Abs: 1.6 10*3/uL — ABNORMAL LOW (ref 1.7–7.7)
PLATELETS: 116 10*3/uL — AB (ref 150–400)
RBC: 4.88 MIL/uL (ref 4.22–5.81)
RDW: 16.4 % — ABNORMAL HIGH (ref 11.5–15.5)
WBC: 3.6 10*3/uL — AB (ref 4.0–10.5)

## 2015-12-23 MED ORDER — GRANISETRON 3.1 MG/24HR TD PTCH: MEDICATED_PATCH | TRANSDERMAL | Status: DC

## 2015-12-23 MED ORDER — FLUOROURACIL CHEMO INJECTION 2.5 GM/50ML
400.0000 mg/m2 | Freq: Once | INTRAVENOUS | Status: AC
  Administered 2015-12-23: 900 mg via INTRAVENOUS
  Filled 2015-12-23: qty 18

## 2015-12-23 MED ORDER — SODIUM CHLORIDE 0.9 % IV SOLN
Freq: Once | INTRAVENOUS | Status: AC
  Administered 2015-12-23: 10:00:00 via INTRAVENOUS
  Filled 2015-12-23: qty 5

## 2015-12-23 MED ORDER — FLUOROURACIL CHEMO INJECTION 5 GM/100ML
2400.0000 mg/m2 | INTRAVENOUS | Status: DC
  Administered 2015-12-23: 5500 mg via INTRAVENOUS
  Filled 2015-12-23: qty 82

## 2015-12-23 MED ORDER — DEXTROSE 5 % IV SOLN
Freq: Once | INTRAVENOUS | Status: AC
  Administered 2015-12-23: 10:00:00 via INTRAVENOUS

## 2015-12-23 MED ORDER — PALONOSETRON HCL INJECTION 0.25 MG/5ML
0.2500 mg | Freq: Once | INTRAVENOUS | Status: AC
  Administered 2015-12-23: 0.25 mg via INTRAVENOUS
  Filled 2015-12-23: qty 5

## 2015-12-23 MED ORDER — OXALIPLATIN CHEMO INJECTION 100 MG/20ML
85.0000 mg/m2 | Freq: Once | INTRAVENOUS | Status: AC
  Administered 2015-12-23: 195 mg via INTRAVENOUS
  Filled 2015-12-23: qty 39

## 2015-12-23 MED ORDER — LORAZEPAM 2 MG/ML IJ SOLN
0.5000 mg | Freq: Once | INTRAMUSCULAR | Status: AC
  Administered 2015-12-23: 0.5 mg via INTRAVENOUS
  Filled 2015-12-23: qty 1

## 2015-12-23 MED ORDER — SODIUM CHLORIDE 0.9% FLUSH: 10.0000 mL | INTRAVENOUS | Status: DC | PRN

## 2015-12-23 MED ORDER — LEUCOVORIN CALCIUM INJECTION 350 MG
400.0000 mg/m2 | Freq: Once | INTRAVENOUS | Status: AC
  Administered 2015-12-23: 920 mg via INTRAVENOUS
  Filled 2015-12-23: qty 46

## 2015-12-23 NOTE — Progress Notes (Signed)
1310:  Tolerated tx w/o adverse reaction.  A&Ox4, in no distress.  VSS.  Discharged ambulatory.

## 2015-12-23 NOTE — Patient Instructions (Signed)
[facility] Discharge Instructions for Patients Receiving Chemotherapy   Beginning January 23rd 2017 lab work for the [facility] will be done in the  Main lab at [facility] on 1st floor. If you have a lab appointment with the [facility] please come in thru the  Main Entrance and check in at the main information desk   Today you received the following chemotherapy agents:  Oxaliplatin, leucovorin, and 5FU.  If you develop nausea and vomiting, or diarrhea that is not controlled by your medication, call the clinic.  The clinic phone number is [phone]. Office hours are Monday-Friday 8:30am-5:00pm.  BELOW ARE SYMPTOMS THAT SHOULD BE REPORTED IMMEDIATELY:  *FEVER GREATER THAN 101.0 F  *CHILLS WITH OR WITHOUT FEVER  NAUSEA AND VOMITING THAT IS NOT CONTROLLED WITH YOUR NAUSEA MEDICATION  *UNUSUAL SHORTNESS OF BREATH  *UNUSUAL BRUISING OR BLEEDING  TENDERNESS IN MOUTH AND THROAT WITH OR WITHOUT PRESENCE OF ULCERS  *URINARY PROBLEMS  *BOWEL PROBLEMS  UNUSUAL RASH Items with * indicate a potential emergency and should be followed up as soon as possible. If you have an emergency after office hours please contact your primary care physician or go to the nearest emergency department.  Please call the clinic during office hours if you have any questions or concerns.   You may also contact the Patient Navigator at [phone] should you have any questions or need assistance in obtaining follow up care.      Resources For Cancer Patients and their Caregivers ? American Cancer Society: Can assist with transportation, wigs, general needs, runs Look Good Feel Better.        581-521-6738 ? Cancer Care: Provides financial assistance, online support groups, medication/co-pay assistance.  1-800-813-HOPE 613 302 7625) ? [place] Assists [place] cancer patients and their families through emotional , educational and financial support.   (671) 736-8446 ? [place] DSS Where to apply for food stamps, Medicaid and utility assistance. (612) 412-0675 ? RCATS: Transportation to medical appointments. (702)241-8973 ? Social Security Administration: May apply for disability if have a Stage IV cancer. 440-485-8036 985-586-8835 ? [employer], Disability and Transit Services: Assists with nutrition, care and transit needs. ([phone]

## 2015-12-25 ENCOUNTER — Encounter (HOSPITAL_COMMUNITY): Payer: BLUE CROSS/BLUE SHIELD

## 2015-12-26 ENCOUNTER — Ambulatory Visit (HOSPITAL_COMMUNITY): Payer: BLUE CROSS/BLUE SHIELD | Admitting: Hematology & Oncology

## 2015-12-26 ENCOUNTER — Ambulatory Visit (HOSPITAL_COMMUNITY): Payer: BLUE CROSS/BLUE SHIELD

## 2015-12-28 ENCOUNTER — Other Ambulatory Visit (HOSPITAL_COMMUNITY): Payer: Self-pay | Admitting: Oncology

## 2015-12-28 ENCOUNTER — Encounter (HOSPITAL_COMMUNITY): Payer: BLUE CROSS/BLUE SHIELD

## 2015-12-28 ENCOUNTER — Telehealth (HOSPITAL_COMMUNITY): Payer: Self-pay | Admitting: Emergency Medicine

## 2015-12-28 DIAGNOSIS — G4700 Insomnia, unspecified: Secondary | ICD-10-CM

## 2015-12-28 MED ORDER — ZOLPIDEM TARTRATE 10 MG PO TABS: 10.0000 mg | ORAL_TABLET | Freq: Every evening | ORAL | Status: DC | PRN

## 2015-12-28 NOTE — Telephone Encounter (Signed)
ambien refilled to [facility] in [place] no refills

## 2015-12-30 ENCOUNTER — Ambulatory Visit (HOSPITAL_COMMUNITY): Payer: BLUE CROSS/BLUE SHIELD | Admitting: Hematology & Oncology

## 2015-12-30 ENCOUNTER — Inpatient Hospital Stay (HOSPITAL_COMMUNITY): Payer: BLUE CROSS/BLUE SHIELD

## 2015-12-30 ENCOUNTER — Encounter (HOSPITAL_BASED_OUTPATIENT_CLINIC_OR_DEPARTMENT_OTHER): Payer: BLUE CROSS/BLUE SHIELD

## 2015-12-30 VITALS — BP 108/79 | HR 86 | Temp 97.9°F | Resp 16

## 2015-12-30 DIAGNOSIS — E860 Dehydration: Secondary | ICD-10-CM | POA: Insufficient documentation

## 2015-12-30 DIAGNOSIS — C187 Malignant neoplasm of sigmoid colon: Secondary | ICD-10-CM

## 2015-12-30 MED ORDER — HEPARIN SOD (PORK) LOCK FLUSH 100 UNIT/ML IV SOLN
500.0000 [IU] | Freq: Once | INTRAVENOUS | Status: AC
  Administered 2015-12-30: 500 [IU] via INTRAVENOUS

## 2015-12-30 MED ORDER — HEPARIN SOD (PORK) LOCK FLUSH 100 UNIT/ML IV SOLN
INTRAVENOUS | Status: AC
  Filled 2015-12-30: qty 5

## 2015-12-30 MED ORDER — PROMETHAZINE HCL 25 MG/ML IJ SOLN
12.5000 mg | INTRAMUSCULAR | Status: DC | PRN
  Administered 2015-12-30 (×2): 12.5 mg via INTRAVENOUS

## 2015-12-30 MED ORDER — SODIUM CHLORIDE 0.9 % IV SOLN
INTRAVENOUS | Status: DC
  Administered 2015-12-30: 14:00:00 via INTRAVENOUS

## 2015-12-30 MED ORDER — PROMETHAZINE HCL 25 MG/ML IJ SOLN
INTRAMUSCULAR | Status: AC
  Filled 2015-12-30: qty 1

## 2015-12-30 NOTE — Progress Notes (Signed)
Patient came in today for fluids.  Patient was given meds for nausea. Patient tolerated well.

## 2015-12-30 NOTE — Patient Instructions (Signed)
[place] at [facility] Discharge Instructions  RECOMMENDATIONS MADE BY THE CONSULTANT AND ANY TEST RESULTS WILL BE SENT TO YOUR REFERRING PHYSICIAN.  You got IV fluids today. Follow up as scheduled. Please drink fluids for hydration. Take your nausea medications Called in a new script for nausea to [facility] today.  Thank you for choosing [place] at [facility] to provide your oncology and hematology care.  To afford each patient quality time with our provider, please arrive at least 15 minutes before your scheduled appointment time.   Beginning January 23rd 2017 lab work for the [facility] will be done in the  Main lab at [facility] on 1st floor. If you have a lab appointment with the [facility] please come in thru the  Main Entrance and check in at the main information desk  You need to re-schedule your appointment should you arrive 10 or more minutes late.  We strive to give you quality time with our providers, and arriving late affects you and other patients whose appointments are after yours.  Also, if you no show three or more times for appointments you may be dismissed from the clinic at the providers discretion.     Again, thank you for choosing [facility].  Our hope is that these requests will decrease the amount of time that you wait before being seen by our physicians.       _____________________________________________________________  Should you have questions after your visit to [facility], please contact our office at [phone] between the hours of 8:30 a.m. and 4:30 p.m.  Voicemails left after 4:30 p.m. will not be returned until the following business day.  For prescription refill requests, have your pharmacy contact our office.         Resources For Cancer Patients and their Caregivers ? American Cancer Society: Can assist with transportation, wigs, general needs, runs  Look Good Feel Better.        506-399-7763 ? Cancer Care: Provides financial assistance, online support groups, medication/co-pay assistance.  1-800-813-HOPE (870)646-9110) ? [place] Assists [place] cancer patients and their families through emotional , educational and financial support.  ([phone] ? [place] DSS Where to apply for food stamps, Medicaid and utility assistance. 779-381-9865 ? RCATS: Transportation to medical appointments. 406-842-9793 ? Social Security Administration: May apply for disability if have a Stage IV cancer. 870-134-3564 9082037619 ? [employer], Disability and Transit Services: Assists with nutrition, care and transit needs. [place] Support Programs: @10RELATIVEDAYS @ > Cancer Support Group  2nd Tuesday of the month 1pm-2pm, Journey Room  > Creative Journey  3rd Tuesday of the month 1130am-1pm, Journey Room  > Look Good Feel Better  1st Wednesday of the month 10am-12 noon, Journey Room (Call [place] to register [phone])

## 2016-01-06 ENCOUNTER — Ambulatory Visit (HOSPITAL_COMMUNITY): Payer: BLUE CROSS/BLUE SHIELD | Admitting: Oncology

## 2016-01-06 ENCOUNTER — Ambulatory Visit (HOSPITAL_COMMUNITY): Payer: BLUE CROSS/BLUE SHIELD

## 2016-01-06 ENCOUNTER — Ambulatory Visit (HOSPITAL_COMMUNITY): Payer: BLUE CROSS/BLUE SHIELD | Admitting: Hematology & Oncology

## 2016-01-06 ENCOUNTER — Encounter (HOSPITAL_BASED_OUTPATIENT_CLINIC_OR_DEPARTMENT_OTHER): Payer: BLUE CROSS/BLUE SHIELD

## 2016-01-06 ENCOUNTER — Inpatient Hospital Stay (HOSPITAL_COMMUNITY): Payer: BLUE CROSS/BLUE SHIELD

## 2016-01-06 ENCOUNTER — Encounter (HOSPITAL_BASED_OUTPATIENT_CLINIC_OR_DEPARTMENT_OTHER): Payer: BLUE CROSS/BLUE SHIELD | Admitting: Hematology & Oncology

## 2016-01-06 VITALS — BP 108/87 | HR 107 | Temp 98.5°F | Resp 16 | Wt 255.0 lb

## 2016-01-06 DIAGNOSIS — C187 Malignant neoplasm of sigmoid colon: Secondary | ICD-10-CM

## 2016-01-06 DIAGNOSIS — R112 Nausea with vomiting, unspecified: Secondary | ICD-10-CM

## 2016-01-06 DIAGNOSIS — Z95828 Presence of other vascular implants and grafts: Secondary | ICD-10-CM

## 2016-01-06 DIAGNOSIS — T451X5A Adverse effect of antineoplastic and immunosuppressive drugs, initial encounter: Secondary | ICD-10-CM

## 2016-01-06 LAB — CBC WITH DIFFERENTIAL/PLATELET
Basophils Absolute: 0 10*3/uL (ref 0.0–0.1)
Basophils Relative: 1 %
EOS ABS: 0.1 10*3/uL (ref 0.0–0.7)
Eosinophils Relative: 2 %
HEMATOCRIT: 43.8 % (ref 39.0–52.0)
HEMOGLOBIN: 14.6 g/dL (ref 13.0–17.0)
LYMPHS ABS: 1 10*3/uL (ref 0.7–4.0)
Lymphocytes Relative: 33 %
MCH: 26.9 pg (ref 26.0–34.0)
MCHC: 33.3 g/dL (ref 30.0–36.0)
MCV: 80.8 fL (ref 78.0–100.0)
MONOS PCT: 18 %
Monocytes Absolute: 0.6 10*3/uL (ref 0.1–1.0)
NEUTROS PCT: 45 %
Neutro Abs: 1.4 10*3/uL — ABNORMAL LOW (ref 1.7–7.7)
Platelets: 95 10*3/uL — ABNORMAL LOW (ref 150–400)
RBC: 5.42 MIL/uL (ref 4.22–5.81)
RDW: 17.6 % — ABNORMAL HIGH (ref 11.5–15.5)
WBC: 3.1 10*3/uL — ABNORMAL LOW (ref 4.0–10.5)

## 2016-01-06 LAB — COMPREHENSIVE METABOLIC PANEL
ALK PHOS: 73 U/L (ref 38–126)
ALT: 23 U/L (ref 17–63)
ANION GAP: 5 (ref 5–15)
AST: 28 U/L (ref 15–41)
Albumin: 3.8 g/dL (ref 3.5–5.0)
BILIRUBIN TOTAL: 0.6 mg/dL (ref 0.3–1.2)
BUN: 15 mg/dL (ref 6–20)
CALCIUM: 9 mg/dL (ref 8.9–10.3)
CO2: 23 mmol/L (ref 22–32)
Chloride: 108 mmol/L (ref 101–111)
Creatinine, Ser: 1.23 mg/dL (ref 0.61–1.24)
Glucose, Bld: 160 mg/dL — ABNORMAL HIGH (ref 65–99)
Potassium: 3.7 mmol/L (ref 3.5–5.1)
SODIUM: 136 mmol/L (ref 135–145)
TOTAL PROTEIN: 6.9 g/dL (ref 6.5–8.1)

## 2016-01-06 MED ORDER — OMEPRAZOLE 40 MG PO CPDR: 40.0000 mg | DELAYED_RELEASE_CAPSULE | Freq: Every day | ORAL | Status: DC

## 2016-01-06 MED ORDER — HEPARIN SOD (PORK) LOCK FLUSH 100 UNIT/ML IV SOLN
500.0000 [IU] | Freq: Once | INTRAVENOUS | Status: AC
  Administered 2016-01-06: 500 [IU] via INTRAVENOUS

## 2016-01-06 MED ORDER — SODIUM CHLORIDE 0.9% FLUSH
10.0000 mL | Freq: Once | INTRAVENOUS | Status: AC
  Administered 2016-01-06: 10 mL via INTRAVENOUS

## 2016-01-06 MED ORDER — PROCHLORPERAZINE MALEATE 10 MG PO TABS
10.0000 mg | ORAL_TABLET | Freq: Four times a day (QID) | ORAL | Status: DC | PRN
Start: 2016-01-06 — End: 2016-09-27

## 2016-01-06 MED ORDER — HEPARIN SOD (PORK) LOCK FLUSH 100 UNIT/ML IV SOLN
INTRAVENOUS | Status: AC
  Filled 2016-01-06: qty 5

## 2016-01-06 NOTE — Progress Notes (Signed)
Chemo deferred x 1 week per Dr.Penland.  Derek Rodriguez presented for Portacath access and flush. Proper placement of portacath confirmed by CXR. Portacath located left chest wall accessed with  H 20 needle. Good blood return present. Portacath flushed with 41ml NS and 500U/8ml Heparin and needle removed intact. Procedure without incident. Patient tolerated procedure well.

## 2016-01-06 NOTE — Patient Instructions (Addendum)
[place] at [facility] Discharge Instructions  RECOMMENDATIONS MADE BY THE CONSULTANT AND ANY TEST RESULTS WILL BE SENT TO YOUR REFERRING PHYSICIAN.  Exam done and seen today by Dr. Whitney Muse Call Angie in reference to insurance issues. Return to see the doctor in 2 weeks with labs and chemo Please call the clinic if you have any questions or concerns  Thank you for choosing [place] at [facility] to provide your oncology and hematology care.  To afford each patient quality time with our provider, please arrive at least 15 minutes before your scheduled appointment time.   Beginning January 23rd 2017 lab work for the [facility] will be done in the  Main lab at [facility] on 1st floor. If you have a lab appointment with the [facility] please come in thru the  Main Entrance and check in at the main information desk  You need to re-schedule your appointment should you arrive 10 or more minutes late.  We strive to give you quality time with our providers, and arriving late affects you and other patients whose appointments are after yours.  Also, if you no show three or more times for appointments you may be dismissed from the clinic at the providers discretion.     Again, thank you for choosing [facility].  Our hope is that these requests will decrease the amount of time that you wait before being seen by our physicians.       _____________________________________________________________  Should you have questions after your visit to [facility], please contact our office at [phone] between the hours of 8:30 a.m. and 4:30 p.m.  Voicemails left after 4:30 p.m. will not be returned until the following business day.  For prescription refill requests, have your pharmacy contact our office.         Resources For Cancer Patients and their Caregivers ? American Cancer Society: Can assist with transportation,  wigs, general needs, runs Look Good Feel Better.        641-189-2934 ? Cancer Care: Provides financial assistance, online support groups, medication/co-pay assistance.  1-800-813-HOPE 936-672-6375) ? [place] Assists [place] cancer patients and their families through emotional , educational and financial support.  774-601-7499 ? [place] DSS Where to apply for food stamps, Medicaid and utility assistance. 9122695468 ? RCATS: Transportation to medical appointments. 4507577863 ? Social Security Administration: May apply for disability if have a Stage IV cancer. 307-553-5227 979-816-8507 ? [employer], Disability and Transit Services: Assists with nutrition, care and transit needs. [place] Support Programs: @10RELATIVEDAYS @ > Cancer Support Group  2nd Tuesday of the month 1pm-2pm, Journey Room  > Creative Journey  3rd Tuesday of the month 1130am-1pm, Journey Room  > Look Good Feel Better  1st Wednesday of the month 10am-12 noon, Journey Room (Call [place] to register [phone])

## 2016-01-06 NOTE — Progress Notes (Signed)
[facility] Hematology/Oncology Consultation   Name: Derek Rodriguez      MRN: [id number]     Date: 01/06/2016 Time:8:19 AM   REFERRING PHYSICIAN:  Aviva Signs, MD (Gen Surg)  REASON FOR CONSULT:  Adenocarcinoma of sigmoid colon.   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    Adenocarcinoma of sigmoid colon ([place])   09/09/2015 - 09/12/2015 Hospital Admission Diverticulitis of colon with perforation   09/12/2015 Imaging CT abd/pelvis- Continued severe inflammation of the proximal half of the sigmoid colon. There is a 2.6 cm oval low-attenuation structure extending between the posterior wall of the inflamed sigmoid colon and the dome of the bladder....   09/27/2015 Tumor Marker CEA: 2.5    10/03/2015 Procedure Partial colectomy, splenic flexure takedown by Dr. Arnoldo Morale   10/03/2015 Pathology Results FULL THICKNESS INVASIVE MODERATELY TO POORLY DIFFERENTIATED ADENOCARCINOMA WITH SURFACE ULCERATION. TUMOR INVADES THROUGH MUSCULARIS MUCOSA AND PERFORATES SEROSAL SURFACE. - TUMOR MEASURES 10.3 CM IN GREATEST DIMENSION. - EXTENSIVE LYMPH/VASCULAR INVASI   10/13/2015 Pathology Results MSI STABLE   10/20/2015 Imaging CT chest- Negative. No evidence of metastatic disease or other active disease within the thorax.   11/04/2015 - 11/04/2015 Chemotherapy CapeOX   11/12/2015 Miscellaneous ED visit with nausea and decreased po intake   11/18/2015 Procedure Port-A-Cath insertion by Dr. Arnoldo Morale   11/25/2015 Treatment Plan Change From Xelox to FOLFOX   11/25/2015 -  Chemotherapy FOLFOX    HISTORY OF PRESENT ILLNESS:   Derek Rodriguez is a 55 year old white American man with a past medical history significant for diverticulitis and HTN who recently was diagnosed with a Stage IIIC ([id number]) adenocarcinoma of sigmoid colon cancer, S/P partial colectomy by Dr. Arnoldo Morale.  Mr. Tobon is accompanied by his wife. He is here for Cycle #4 FOLFOX.  He was told not to take Zofran while on the  Sancuso patch so he discontinued the Zofran. He is interested ABHR gel. He is not on Prilosec. He has not been taking Zantac. He has been struggling with nausea. It is an ongoing issue.   The patient is curious if his chemotherapy can be reduced as he felt much worse with this last treatment. He was not able to drink cold drinks at all. Reports when he takes his medications with water, he throws them back up. He has felt nauseated and fatigued. Reports the first Tuesday and Wednesday after chemotherapy he experiences constipation and takes medication to manage it. He is able to button shirts without issue. Denies any numbness or tingling in his feet.   Review of Systems  Constitutional: Positive for malaise/fatigue. Negative for fever, chills, weight loss and diaphoresis.  HENT: Negative.  Negative for congestion and nosebleeds.   Eyes: Negative.   Respiratory: Negative.  Negative for cough, hemoptysis and sputum production.   Cardiovascular: Negative.  Negative for chest pain and palpitations.  Gastrointestinal: Positive for nausea, vomiting and constipation. Negative for heartburn, abdominal pain, diarrhea, blood in stool and melena.       Constipation with chemotherapy, managed with medication.  Genitourinary: Negative.   Musculoskeletal: Negative.  Negative for myalgias and neck pain.  Skin: Negative.  Negative for rash.  Neurological: Negative.  Negative for weakness and headaches.  Endo/Heme/Allergies: Negative.   Psychiatric/Behavioral: The patient is nervous/anxious.   14 point review of systems was performed and is negative except as detailed under history of present illness and above   PAST MEDICAL HISTORY:   Past Medical History  Diagnosis Date  . Diverticulitis   . Hypertension   . Arthritis   . Adenocarcinoma of sigmoid colon ([place]) 10/18/2015    ALLERGIES: No Known Allergies    MEDICATIONS: I have reviewed the patient's current medications.    Current Outpatient  Prescriptions on File Prior to Visit  Medication Sig Dispense Refill  . escitalopram (LEXAPRO) 20 MG tablet For the first 3 days take 1/2 tablet. Then take 1 tablet daily thereafter. 30 tablet 1  . granisetron (SANCUSO) 3.1 MG/24HR Apply to skin starting 24 hours before chemotherapy. Remove after 7 days. 1 each 5  . ibuprofen (ADVIL,MOTRIN) 200 MG tablet Take 400 mg by mouth every 6 (six) hours as needed for mild pain or moderate pain.    Marland Kitchen lidocaine-prilocaine (EMLA) cream Apply a quarter size amount to port site 1 hour prior to chemo. Do not rub in. Cover with plastic wrap. 30 g 3  . lisinopril (PRINIVIL,ZESTRIL) 10 MG tablet Take 10 mg by mouth daily.    Marland Kitchen LORazepam (ATIVAN) 0.5 MG tablet Take 1 tablet (0.5 mg total) by mouth every 6 (six) hours as needed for anxiety. 120 tablet 0  . ondansetron (ZOFRAN) 8 MG tablet Take 1 tablet (8 mg total) by mouth every 8 (eight) hours as needed for nausea or vomiting. 30 tablet 2  . OXALIPLATIN IV Inject into the vein. (chemo) To be given once every 21 days    . oxyCODONE-acetaminophen (PERCOCET) 7.5-325 MG tablet Take 1-2 tablets by mouth every 4 (four) hours as needed. 30 tablet 0  . Probiotic Product ([place]) CAPS Take 1 capsule by mouth daily. Reported on 11/30/2015    . promethazine (PHENERGAN) 25 MG suppository Place 1 suppository (25 mg total) rectally every 6 (six) hours as needed for nausea or vomiting. 6 each 0  . ranitidine (ZANTAC) 150 MG tablet Take 150 mg by mouth daily as needed for heartburn.    . zolpidem (AMBIEN) 10 MG tablet Take 1 tablet (10 mg total) by mouth at bedtime as needed for sleep. 30 tablet 0   No current facility-administered medications on file prior to visit.     PAST SURGICAL HISTORY Past Surgical History  Procedure Laterality Date  . Partial colectomy N/A 10/03/2015    Procedure: PARTIAL COLECTOMY;  Surgeon: Aviva Signs, MD;  Location: [facility];  Service: General;  Laterality: N/A;  . Portacath placement  N/A 11/18/2015    Procedure: INSERTION PORT-A-CATH LEFT SUBCLAVIAN;  Surgeon: Aviva Signs, MD;  Location: [facility];  Service: General;  Laterality: N/A;    FAMILY HISTORY: Family History  Problem Relation Age of Onset  . Diabetes Father    No known family history of cancer Mother alive at age 77- healthy Father deceased at age of 58 from heart disease, complications of DM 53 son 80 years old- healthy 91 grandson 61 year old- healthy  SOCIAL HISTORY:  reports that he quit smoking about 30 years ago. His smoking use included Cigars and Pipe. He quit smokeless tobacco use about 6 months ago. His smokeless tobacco use included Snuff. He reports that he does not drink alcohol or use illicit drugs.  He works at [employer].  He is married x 37 years.  He is [employer].  PERFORMANCE STATUS: The patient's performance status is 0 - Asymptomatic  PHYSICAL EXAM: Most Recent Vital Signs: Blood pressure 108/87, pulse 107, temperature 98.5 F (36.9 C), temperature source Oral, resp. rate 16, weight 255 lb (115.667 kg), SpO2 98 %. General appearance: alert,  cooperative, appears stated age, no distress, mildly obese and accompanied by his wife. Head: Normocephalic, without obvious abnormality, atraumatic Eyes: negative findings: lids and lashes normal, conjunctivae and sclerae normal and corneas clear Throat: normal findings: lips normal without lesions, buccal mucosa normal, tongue midline and normal, soft palate, uvula, and tonsils normal and oropharynx pink & moist without lesions or evidence of thrush and abnormal findings: dentition: poor Neck: no adenopathy, supple, symmetrical, trachea midline and thyroid not enlarged, symmetric, no tenderness/mass/nodules Lungs: clear to auscultation bilaterally and normal percussion bilaterally Heart: regular rate and rhythm, S1, S2 normal, no murmur, click, rub or gallop Abdomen: soft, non-tender; bowel sounds normal; no masses,  no organomegaly and low-midline  surgical site is well healed Extremities: extremities normal, atraumatic, no cyanosis or edema Skin: Skin color, texture, turgor normal. No rashes or lesions Neurologic: Alert and oriented X 3, normal strength and tone. Normal symmetric reflexes. Normal coordination and gait  LABORATORY DATA:  I have reviewed the data as listed. Results for DAMONTA, COSSEY (MRN [id number])   Ref. Range 01/06/2016 08:50  Sodium Latest Ref Range: 135 - 145 mmol/L 136  Potassium Latest Ref Range: 3.5 - 5.1 mmol/L 3.7  Chloride Latest Ref Range: 101 - 111 mmol/L 108  CO2 Latest Ref Range: 22 - 32 mmol/L 23  BUN Latest Ref Range: 6 - 20 mg/dL 15  Creatinine Latest Ref Range: 0.61 - 1.24 mg/dL 1.23  Calcium Latest Ref Range: 8.9 - 10.3 mg/dL 9.0  EGFR (Non-African Amer.) Latest Ref Range: >60 mL/min >60  EGFR (African American) Latest Ref Range: >60 mL/min >60  Glucose Latest Ref Range: 65 - 99 mg/dL 160 (H)  Anion gap Latest Ref Range: 5 - 15  5  Alkaline Phosphatase Latest Ref Range: 38 - 126 U/L 73  Albumin Latest Ref Range: 3.5 - 5.0 g/dL 3.8  AST Latest Ref Range: 15 - 41 U/L 28  ALT Latest Ref Range: 17 - 63 U/L 23  Total Protein Latest Ref Range: 6.5 - 8.1 g/dL 6.9  Total Bilirubin Latest Ref Range: 0.3 - 1.2 mg/dL 0.6  WBC Latest Ref Range: 4.0 - 10.5 K/uL 3.1 (L)  RBC Latest Ref Range: 4.22 - 5.81 MIL/uL 5.42  Hemoglobin Latest Ref Range: 13.0 - 17.0 g/dL 14.6  HCT Latest Ref Range: 39.0 - 52.0 % 43.8  MCV Latest Ref Range: 78.0 - 100.0 fL 80.8  MCH Latest Ref Range: 26.0 - 34.0 pg 26.9  MCHC Latest Ref Range: 30.0 - 36.0 g/dL 33.3  RDW Latest Ref Range: 11.5 - 15.5 % 17.6 (H)  Platelets Latest Ref Range: 150 - 400 K/uL 95 (L)  Neutrophils Latest Units: % 45  Lymphocytes Latest Units: % 33  Monocytes Relative Latest Units: % 18  Eosinophil Latest Units: % 2  Basophil Latest Units: % 1  NEUT# Latest Ref Range: 1.7 - 7.7 K/uL 1.4 (L)  Lymphocyte # Latest Ref Range: 0.7 - 4.0 K/uL 1.0  Monocyte  # Latest Ref Range: 0.1 - 1.0 K/uL 0.6  Eosinophils Absolute Latest Ref Range: 0.0 - 0.7 K/uL 0.1  Basophils Absolute Latest Ref Range: 0.0 - 0.1 K/uL 0.0    Lab Results  Component Value Date   CEA 2.5 09/27/2015     RADIOGRAPHY: I have personally reviewed the radiological images as listed and agreed with the findings in the report.  Study Result     CLINICAL DATA: Placement of left chest Port-A-Cath.  EXAM: PORTABLE CHEST 1 VIEW  COMPARISON: 11/09/2015  FINDINGS: A  left subclavian Port-A-Cath has been placed. The tip is in the upper SVC region. Negative for a pneumothorax. Both lungs are clear. Heart and mediastinum are within normal limits. The trachea is midline. No acute bone abnormality.  IMPRESSION: Placement a left subclavian Port-A-Cath without pneumothorax. Catheter tip in the upper SVC region.   Electronically Signed  By: Markus Daft M.D.  On: 11/18/2015 10:04     PATHOLOGY:    Diagnosis Colon, segmental resection, sigmoid - FULL THICKNESS INVASIVE MODERATELY TO POORLY DIFFERENTIATED ADENOCARCINOMA WITH SURFACE ULCERATION. 1 of 4 FINAL for Hiram, Rashad [phone]) Diagnosis(continued) - TUMOR INVADES THROUGH MUSCULARIS MUCOSA AND PERFORATES SEROSAL SURFACE. - TUMOR MEASURES 10.3 CM IN GREATEST DIMENSION. - EXTENSIVE LYMPH/VASCULAR INVASION IS IDENTIFIED. - DISTAL MARGIN DEMONSTRATES ADENOCARCINOMA INVOLVING THE LYMPH/VASCULAR SPACES. - PROXIMAL MARGIN IS NEGATIVE. - ELEVEN OF TWELVE LYMPH NODES ARE POSITIVE FOR METASTATIC ADENOCARCINOMA (11/12). - EXTRACAPSULAR EXTENSION IS PRESENT. 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  Unfortunately he is at high risk for recurrence and I strongly emphasized the need for adjuvant therapy. I would also recommend potential referral near the completion of therapy for HIPEC evaluation at [facility]. Kalim agreed to try FOLFOX after he was intolerant of XELOX. He is here today for ongoing therapy.  He has difficulty with nausea/vomiting. We have added EMEND to his regimen. He is on a SANCUSO patch. I have addressed his anxiety.   The patient is here for Cycle #4 FOLFOX. I have dose reduced oxaliplatin by 20%, eliminated the 5-FU bolus, and added omeprazole/prilosec.  He came in for IV fluids last Friday, 7/14.   The patient was given a list of what medications he can take while on the Sancuso patch.   I have written a refill for compazine.  He will return for follow up with his next treatment on 8/4.   All questions were answered. The patient knows to call the clinic with any problems, questions or concerns. We can certainly see the patient much sooner if necessary.  This document serves as a record of services personally performed by Ancil Linsey, MD. It was created on her behalf by Arlyce Harman, a trained medical scribe. The creation of this record is based on the scribe's personal observations and the provider's statements to them. This document has been checked and approved by the attending provider.  I have reviewed the above documentation for accuracy and completeness, and I agree with the above.  This note is electronically signed by: Molli Hazard, MD  01/06/2016 8:19 AM

## 2016-01-08 ENCOUNTER — Encounter (HOSPITAL_COMMUNITY): Payer: BLUE CROSS/BLUE SHIELD

## 2016-01-09 ENCOUNTER — Ambulatory Visit (HOSPITAL_COMMUNITY): Payer: BLUE CROSS/BLUE SHIELD | Admitting: Oncology

## 2016-01-09 ENCOUNTER — Ambulatory Visit (HOSPITAL_COMMUNITY): Payer: BLUE CROSS/BLUE SHIELD

## 2016-01-11 ENCOUNTER — Ambulatory Visit (HOSPITAL_COMMUNITY): Payer: BLUE CROSS/BLUE SHIELD

## 2016-01-11 ENCOUNTER — Encounter (HOSPITAL_COMMUNITY): Payer: BLUE CROSS/BLUE SHIELD

## 2016-01-11 ENCOUNTER — Ambulatory Visit (HOSPITAL_COMMUNITY): Payer: BLUE CROSS/BLUE SHIELD | Admitting: Hematology & Oncology

## 2016-01-13 ENCOUNTER — Encounter (HOSPITAL_COMMUNITY): Payer: BLUE CROSS/BLUE SHIELD

## 2016-01-13 ENCOUNTER — Ambulatory Visit (HOSPITAL_COMMUNITY): Payer: BLUE CROSS/BLUE SHIELD | Admitting: Oncology

## 2016-01-13 ENCOUNTER — Inpatient Hospital Stay (HOSPITAL_COMMUNITY): Payer: BLUE CROSS/BLUE SHIELD

## 2016-01-13 ENCOUNTER — Other Ambulatory Visit (HOSPITAL_COMMUNITY): Payer: Self-pay | Admitting: *Deleted

## 2016-01-13 ENCOUNTER — Encounter (HOSPITAL_BASED_OUTPATIENT_CLINIC_OR_DEPARTMENT_OTHER): Payer: BLUE CROSS/BLUE SHIELD

## 2016-01-13 ENCOUNTER — Other Ambulatory Visit (HOSPITAL_COMMUNITY): Payer: Self-pay | Admitting: Oncology

## 2016-01-13 VITALS — BP 139/88 | HR 72 | Temp 98.2°F | Resp 18 | Wt 227.4 lb

## 2016-01-13 DIAGNOSIS — Z5111 Encounter for antineoplastic chemotherapy: Secondary | ICD-10-CM

## 2016-01-13 DIAGNOSIS — C799 Secondary malignant neoplasm of unspecified site: Secondary | ICD-10-CM | POA: Diagnosis not present

## 2016-01-13 DIAGNOSIS — C187 Malignant neoplasm of sigmoid colon: Secondary | ICD-10-CM

## 2016-01-13 LAB — CBC WITH DIFFERENTIAL/PLATELET
BASOS ABS: 0 10*3/uL (ref 0.0–0.1)
BASOS PCT: 1 %
EOS ABS: 0.1 10*3/uL (ref 0.0–0.7)
EOS PCT: 4 %
HCT: 41.6 % (ref 39.0–52.0)
Hemoglobin: 13.9 g/dL (ref 13.0–17.0)
Lymphocytes Relative: 42 %
Lymphs Abs: 1.2 10*3/uL (ref 0.7–4.0)
MCH: 27 pg (ref 26.0–34.0)
MCHC: 33.4 g/dL (ref 30.0–36.0)
MCV: 80.9 fL (ref 78.0–100.0)
MONO ABS: 0.6 10*3/uL (ref 0.1–1.0)
Monocytes Relative: 19 %
Neutro Abs: 1 10*3/uL — ABNORMAL LOW (ref 1.7–7.7)
Neutrophils Relative %: 34 %
PLATELETS: 137 10*3/uL — AB (ref 150–400)
RBC: 5.14 MIL/uL (ref 4.22–5.81)
RDW: 17.6 % — AB (ref 11.5–15.5)
WBC: 3 10*3/uL — AB (ref 4.0–10.5)

## 2016-01-13 LAB — COMPREHENSIVE METABOLIC PANEL
ALBUMIN: 3.7 g/dL (ref 3.5–5.0)
ALK PHOS: 69 U/L (ref 38–126)
ALT: 15 U/L — ABNORMAL LOW (ref 17–63)
ANION GAP: 5 (ref 5–15)
AST: 23 U/L (ref 15–41)
BILIRUBIN TOTAL: 0.7 mg/dL (ref 0.3–1.2)
BUN: 9 mg/dL (ref 6–20)
CO2: 25 mmol/L (ref 22–32)
Calcium: 8.9 mg/dL (ref 8.9–10.3)
Chloride: 107 mmol/L (ref 101–111)
Creatinine, Ser: 1.04 mg/dL (ref 0.61–1.24)
GFR calc Af Amer: >60 mL/min (ref >60)
GFR calc non Af Amer: >60 mL/min (ref >60)
GLUCOSE: 117 mg/dL — AB (ref 65–99)
POTASSIUM: 3.8 mmol/L (ref 3.5–5.1)
SODIUM: 137 mmol/L (ref 135–145)
TOTAL PROTEIN: 6.8 g/dL (ref 6.5–8.1)

## 2016-01-13 MED ORDER — HEPARIN SOD (PORK) LOCK FLUSH 100 UNIT/ML IV SOLN
INTRAVENOUS | Status: AC
  Filled 2016-01-13: qty 5

## 2016-01-13 MED ORDER — HEPARIN LOCK FLUSH 100 UNIT/ML IV SOLN: INTRAVENOUS | 0 refills | Status: DC

## 2016-01-13 MED ORDER — LEUCOVORIN CALCIUM INJECTION 350 MG
400.0000 mg/m2 | Freq: Once | INTRAVENOUS | Status: AC
  Administered 2016-01-13: 920 mg via INTRAVENOUS
  Filled 2016-01-13: qty 46

## 2016-01-13 MED ORDER — PALONOSETRON HCL INJECTION 0.25 MG/5ML
0.2500 mg | Freq: Once | INTRAVENOUS | Status: AC
  Administered 2016-01-13: 0.25 mg via INTRAVENOUS

## 2016-01-13 MED ORDER — OXALIPLATIN CHEMO INJECTION 100 MG/20ML
68.0000 mg/m2 | Freq: Once | INTRAVENOUS | Status: AC
  Administered 2016-01-13: 155 mg via INTRAVENOUS
  Filled 2016-01-13: qty 31

## 2016-01-13 MED ORDER — SODIUM CHLORIDE 0.9 % IV SOLN
2400.0000 mg/m2 | INTRAVENOUS | Status: DC
  Administered 2016-01-13: 5500 mg via INTRAVENOUS
  Filled 2016-01-13: qty 100

## 2016-01-13 MED ORDER — DEXTROSE 5 % IV SOLN
Freq: Once | INTRAVENOUS | Status: AC
  Administered 2016-01-13: 09:00:00 via INTRAVENOUS

## 2016-01-13 MED ORDER — NORMAL SALINE FLUSH 0.9 % IV SOLN: INTRAVENOUS | 0 refills | Status: DC

## 2016-01-13 MED ORDER — MEGESTROL ACETATE 400 MG/10ML PO SUSP: ORAL | 6 refills | Status: DC

## 2016-01-13 MED ORDER — PALONOSETRON HCL INJECTION 0.25 MG/5ML
INTRAVENOUS | Status: AC
  Filled 2016-01-13: qty 5

## 2016-01-13 MED ORDER — SODIUM CHLORIDE 0.9% FLUSH
10.0000 mL | INTRAVENOUS | Status: DC | PRN
  Administered 2016-01-13: 10 mL
  Filled 2016-01-13: qty 10

## 2016-01-13 MED ORDER — SODIUM CHLORIDE 0.9 % IV SOLN
Freq: Once | INTRAVENOUS | Status: AC
  Administered 2016-01-13: 10:00:00 via INTRAVENOUS
  Filled 2016-01-13: qty 5

## 2016-01-13 MED ORDER — LORAZEPAM 2 MG/ML IJ SOLN
INTRAMUSCULAR | Status: AC
  Filled 2016-01-13: qty 1

## 2016-01-13 MED ORDER — LORAZEPAM 2 MG/ML IJ SOLN
0.5000 mg | Freq: Once | INTRAMUSCULAR | Status: AC
  Administered 2016-01-13: 0.5 mg via INTRAVENOUS

## 2016-01-13 NOTE — Patient Instructions (Signed)
[facility] Discharge Instructions for Patients Receiving Chemotherapy   Beginning January 23rd 2017 lab work for the [facility] will be done in the  Main lab at [facility] on 1st floor. If you have a lab appointment with the [facility] please come in thru the  Main Entrance and check in at the main information desk   Today you received the following chemotherapy agents Oxaliplatin, Leucovorin and 5FU continuous infusion pump. Return to clinic on Monday for neulasta injection.  To help prevent nausea and vomiting after your treatment, we encourage you to take your nausea medication as instructed.  If you develop nausea and vomiting, or diarrhea that is not controlled by your medication, call the clinic.  The clinic phone number is [phone]. Office hours are Monday-Friday 8:30am-5:00pm.  BELOW ARE SYMPTOMS THAT SHOULD BE REPORTED IMMEDIATELY:  *FEVER GREATER THAN 101.0 F  *CHILLS WITH OR WITHOUT FEVER  NAUSEA AND VOMITING THAT IS NOT CONTROLLED WITH YOUR NAUSEA MEDICATION  *UNUSUAL SHORTNESS OF BREATH  *UNUSUAL BRUISING OR BLEEDING  TENDERNESS IN MOUTH AND THROAT WITH OR WITHOUT PRESENCE OF ULCERS  *URINARY PROBLEMS  *BOWEL PROBLEMS  UNUSUAL RASH Items with * indicate a potential emergency and should be followed up as soon as possible. If you have an emergency after office hours please contact your primary care physician or go to the nearest emergency department.  Please call the clinic during office hours if you have any questions or concerns.   You may also contact the Patient Navigator at [phone] should you have any questions or need assistance in obtaining follow up care.      Resources For Cancer Patients and their Caregivers ? American Cancer Society: Can assist with transportation, wigs, general needs, runs Look Good Feel Better.        ([phone] ? Cancer Care: Provides financial assistance, online support groups,  medication/co-pay assistance.  1-800-813-HOPE (616) 272-7421) ? [place] Assists [place] cancer patients and their families through emotional , educational and financial support.  936-484-8130 ? [place] DSS Where to apply for food stamps, Medicaid and utility assistance. 985-090-7566 ? RCATS: Transportation to medical appointments. 579-033-9667 ? Social Security Administration: May apply for disability if have a Stage IV cancer. 703-143-2120 [phone] ? [employer], Disability and Transit Services: Assists with nutrition, care and transit needs. [phone]

## 2016-01-16 ENCOUNTER — Encounter (HOSPITAL_BASED_OUTPATIENT_CLINIC_OR_DEPARTMENT_OTHER): Payer: BLUE CROSS/BLUE SHIELD

## 2016-01-16 VITALS — BP 139/91 | HR 73 | Temp 97.9°F | Resp 16

## 2016-01-16 DIAGNOSIS — C779 Secondary and unspecified malignant neoplasm of lymph node, unspecified: Secondary | ICD-10-CM

## 2016-01-16 DIAGNOSIS — Z5189 Encounter for other specified aftercare: Secondary | ICD-10-CM | POA: Diagnosis not present

## 2016-01-16 DIAGNOSIS — C187 Malignant neoplasm of sigmoid colon: Secondary | ICD-10-CM | POA: Diagnosis not present

## 2016-01-16 MED ORDER — PEGFILGRASTIM INJECTION 6 MG/0.6ML ~~LOC~~
PREFILLED_SYRINGE | SUBCUTANEOUS | Status: AC
  Filled 2016-01-16: qty 0.6

## 2016-01-16 MED ORDER — PEGFILGRASTIM INJECTION 6 MG/0.6ML ~~LOC~~
6.0000 mg | PREFILLED_SYRINGE | Freq: Once | SUBCUTANEOUS | Status: AC
  Administered 2016-01-16: 6 mg via SUBCUTANEOUS

## 2016-01-16 NOTE — Patient Instructions (Signed)
[place] at [facility] Discharge Instructions  RECOMMENDATIONS MADE BY THE CONSULTANT AND ANY TEST RESULTS WILL BE SENT TO YOUR REFERRING PHYSICIAN.  Neulasta today.    Thank you for choosing [place] at [facility] to provide your oncology and hematology care.  To afford each patient quality time with our provider, please arrive at least 15 minutes before your scheduled appointment time.   Beginning January 23rd 2017 lab work for the [facility] will be done in the  Main lab at [facility] on 1st floor. If you have a lab appointment with the [place] please come in thru the  Main Entrance and check in at the main information desk  You need to re-schedule your appointment should you arrive 10 or more minutes late.  We strive to give you quality time with our providers, and arriving late affects you and other patients whose appointments are after yours.  Also, if you no show three or more times for appointments you may be dismissed from the clinic at the providers discretion.     Again, thank you for choosing [facility].  Our hope is that these requests will decrease the amount of time that you wait before being seen by our physicians.       _____________________________________________________________  Should you have questions after your visit to [facility], please contact our office at [phone] between the hours of 8:30 a.m. and 4:30 p.m.  Voicemails left after 4:30 p.m. will not be returned until the following business day.  For prescription refill requests, have your pharmacy contact our office.         Resources For Cancer Patients and their Caregivers ? American Cancer Society: Can assist with transportation, wigs, general needs, runs Look Good Feel Better.        ([phone] [phone] ? Cancer Care: Provides financial assistance, online support groups, medication/co-pay assistance.   1-800-813-HOPE (479)378-3822) ? [place] Assists [place] cancer patients and their families through emotional , educational and financial support.  (586)388-4126 ? [place] DSS Where to apply for food stamps, Medicaid and utility assistance. (702) 521-2440 ? RCATS: Transportation to medical appointments. 908-845-7133 ? Social Security Administration: May apply for disability if have a Stage IV cancer. 413 102 4954 [phone])530-5638 ? [employer], Disability and Transit Services: Assists with nutrition, care and transit needs. [place] Support Programs: @10RELATIVEDAYS @ > Cancer Support Group  2nd Tuesday of the month 1pm-2pm, Journey Room  > Creative Journey  3rd Tuesday of the month 1130am-1pm, Journey Room  > Look Good Feel Better  1st Wednesday of the month 10am-12 noon, Journey Room (Call [place] to register [phone])

## 2016-01-16 NOTE — Progress Notes (Signed)
Derek Rodriguez presents today for injection per MD orders. Neulasta 6mg  administered SQ in right Abdomen. Administration without incident. Patient tolerated well.  Patient returned home infusion pump.  Infused and de-accessed without difficulty per patient.

## 2016-01-17 ENCOUNTER — Other Ambulatory Visit (HOSPITAL_COMMUNITY): Payer: Self-pay | Admitting: Oncology

## 2016-01-17 DIAGNOSIS — C187 Malignant neoplasm of sigmoid colon: Secondary | ICD-10-CM

## 2016-01-17 MED ORDER — OXYCODONE-ACETAMINOPHEN 7.5-325 MG PO TABS: 1.0000 | ORAL_TABLET | ORAL | 0 refills | Status: DC | PRN

## 2016-01-20 ENCOUNTER — Ambulatory Visit (HOSPITAL_COMMUNITY): Payer: BLUE CROSS/BLUE SHIELD | Admitting: Oncology

## 2016-01-20 ENCOUNTER — Ambulatory Visit (HOSPITAL_COMMUNITY): Payer: BLUE CROSS/BLUE SHIELD

## 2016-01-23 ENCOUNTER — Other Ambulatory Visit (HOSPITAL_COMMUNITY): Payer: Self-pay | Admitting: Oncology

## 2016-01-23 DIAGNOSIS — G4700 Insomnia, unspecified: Secondary | ICD-10-CM

## 2016-01-24 ENCOUNTER — Encounter (HOSPITAL_COMMUNITY): Payer: BLUE CROSS/BLUE SHIELD | Attending: Oncology

## 2016-01-24 DIAGNOSIS — Z87891 Personal history of nicotine dependence: Secondary | ICD-10-CM | POA: Insufficient documentation

## 2016-01-24 DIAGNOSIS — M1990 Unspecified osteoarthritis, unspecified site: Secondary | ICD-10-CM | POA: Insufficient documentation

## 2016-01-24 DIAGNOSIS — E860 Dehydration: Secondary | ICD-10-CM | POA: Diagnosis not present

## 2016-01-24 DIAGNOSIS — Z79899 Other long term (current) drug therapy: Secondary | ICD-10-CM | POA: Insufficient documentation

## 2016-01-24 DIAGNOSIS — C187 Malignant neoplasm of sigmoid colon: Secondary | ICD-10-CM | POA: Insufficient documentation

## 2016-01-24 DIAGNOSIS — D649 Anemia, unspecified: Secondary | ICD-10-CM | POA: Insufficient documentation

## 2016-01-24 DIAGNOSIS — Z9889 Other specified postprocedural states: Secondary | ICD-10-CM | POA: Insufficient documentation

## 2016-01-24 DIAGNOSIS — Z833 Family history of diabetes mellitus: Secondary | ICD-10-CM | POA: Insufficient documentation

## 2016-01-24 DIAGNOSIS — I10 Essential (primary) hypertension: Secondary | ICD-10-CM | POA: Insufficient documentation

## 2016-01-24 DIAGNOSIS — K5792 Diverticulitis of intestine, part unspecified, without perforation or abscess without bleeding: Secondary | ICD-10-CM | POA: Insufficient documentation

## 2016-01-24 MED ORDER — SODIUM CHLORIDE 0.9 % IV SOLN
INTRAVENOUS | Status: DC
  Administered 2016-01-24: 13:00:00 via INTRAVENOUS

## 2016-01-24 MED ORDER — HEPARIN SOD (PORK) LOCK FLUSH 100 UNIT/ML IV SOLN
500.0000 [IU] | Freq: Once | INTRAVENOUS | Status: AC
  Administered 2016-01-24: 500 [IU] via INTRAVENOUS

## 2016-01-24 NOTE — Patient Instructions (Signed)
[place] at [facility] Discharge Instructions  RECOMMENDATIONS MADE BY THE CONSULTANT AND ANY TEST RESULTS WILL BE SENT TO YOUR REFERRING PHYSICIAN.  Fluids given today per orders. folllow up as directed.  Thank you for choosing [place] at [facility] to provide your oncology and hematology care.  To afford each patient quality time with our provider, please arrive at least 15 minutes before your scheduled appointment time.   Beginning January 23rd 2017 lab work for the [facility] will be done in the  Main lab at [facility] on 1st floor. If you have a lab appointment with the [place] please come in thru the  Main Entrance and check in at the main information desk  You need to re-schedule your appointment should you arrive 10 or more minutes late.  We strive to give you quality time with our providers, and arriving late affects you and other patients whose appointments are after yours.  Also, if you no show three or more times for appointments you may be dismissed from the clinic at the providers discretion.     Again, thank you for choosing [facility].  Our hope is that these requests will decrease the amount of time that you wait before being seen by our physicians.       _____________________________________________________________  Should you have questions after your visit to [facility], please contact our office at [phone] between the hours of 8:30 a.m. and 4:30 p.m.  Voicemails left after 4:30 p.m. will not be returned until the following business day.  For prescription refill requests, have your pharmacy contact our office.         Resources For Cancer Patients and their Caregivers ? American Cancer Society: Can assist with transportation, wigs, general needs, runs Look Good Feel Better.        (401)381-6785 ? Cancer Care: Provides financial assistance, online support groups,  medication/co-pay assistance.  1-800-813-HOPE 714-045-8955) ? [place] Assists [place] cancer patients and their families through emotional , educational and financial support.  (214) 307-7382 ? [place] DSS Where to apply for food stamps, Medicaid and utility assistance. (574)835-0940 ? RCATS: Transportation to medical appointments. 780-800-9890 ? Social Security Administration: May apply for disability if have a Stage IV cancer. 318-782-3709 (365)008-4868 ? [employer], Disability and Transit Services: Assists with nutrition, care and transit needs. [place] Support Programs: @10RELATIVEDAYS @ > Cancer Support Group  2nd Tuesday of the month 1pm-2pm, Journey Room  > Creative Journey  3rd Tuesday of the month 1130am-1pm, Journey Room  > Look Good Feel Better  1st Wednesday of the month 10am-12 noon, Journey Room (Call [place] to register [phone])

## 2016-01-24 NOTE — Progress Notes (Signed)
Patient came in for IVF bolus today per orders. 1 liter given per order.Patient tolerated them well.  Vitals stable . Patient left ambulatory with wife.

## 2016-01-26 ENCOUNTER — Other Ambulatory Visit (HOSPITAL_COMMUNITY): Payer: Self-pay | Admitting: Oncology

## 2016-01-26 DIAGNOSIS — G4700 Insomnia, unspecified: Secondary | ICD-10-CM

## 2016-01-27 ENCOUNTER — Other Ambulatory Visit (HOSPITAL_COMMUNITY): Payer: Self-pay | Admitting: Oncology

## 2016-01-27 ENCOUNTER — Inpatient Hospital Stay (HOSPITAL_COMMUNITY): Payer: BLUE CROSS/BLUE SHIELD

## 2016-01-27 ENCOUNTER — Encounter (HOSPITAL_BASED_OUTPATIENT_CLINIC_OR_DEPARTMENT_OTHER): Payer: BLUE CROSS/BLUE SHIELD

## 2016-01-27 ENCOUNTER — Ambulatory Visit (HOSPITAL_COMMUNITY): Payer: BLUE CROSS/BLUE SHIELD | Admitting: Hematology & Oncology

## 2016-01-27 DIAGNOSIS — Z79899 Other long term (current) drug therapy: Secondary | ICD-10-CM | POA: Diagnosis not present

## 2016-01-27 DIAGNOSIS — K5792 Diverticulitis of intestine, part unspecified, without perforation or abscess without bleeding: Secondary | ICD-10-CM | POA: Diagnosis not present

## 2016-01-27 DIAGNOSIS — Z9889 Other specified postprocedural states: Secondary | ICD-10-CM | POA: Diagnosis not present

## 2016-01-27 DIAGNOSIS — C187 Malignant neoplasm of sigmoid colon: Secondary | ICD-10-CM

## 2016-01-27 DIAGNOSIS — G4700 Insomnia, unspecified: Secondary | ICD-10-CM

## 2016-01-27 DIAGNOSIS — R630 Anorexia: Secondary | ICD-10-CM

## 2016-01-27 DIAGNOSIS — D649 Anemia, unspecified: Secondary | ICD-10-CM | POA: Diagnosis not present

## 2016-01-27 DIAGNOSIS — R112 Nausea with vomiting, unspecified: Secondary | ICD-10-CM

## 2016-01-27 DIAGNOSIS — M1990 Unspecified osteoarthritis, unspecified site: Secondary | ICD-10-CM | POA: Diagnosis not present

## 2016-01-27 DIAGNOSIS — E860 Dehydration: Secondary | ICD-10-CM | POA: Diagnosis not present

## 2016-01-27 DIAGNOSIS — I10 Essential (primary) hypertension: Secondary | ICD-10-CM | POA: Diagnosis not present

## 2016-01-27 DIAGNOSIS — R634 Abnormal weight loss: Secondary | ICD-10-CM

## 2016-01-27 DIAGNOSIS — Z833 Family history of diabetes mellitus: Secondary | ICD-10-CM | POA: Diagnosis not present

## 2016-01-27 DIAGNOSIS — Z87891 Personal history of nicotine dependence: Secondary | ICD-10-CM | POA: Diagnosis not present

## 2016-01-27 LAB — CBC WITH DIFFERENTIAL/PLATELET
Basophils Absolute: 0 10*3/uL (ref 0.0–0.1)
Basophils Relative: 0 %
EOS PCT: 1 %
Eosinophils Absolute: 0 10*3/uL (ref 0.0–0.7)
HEMATOCRIT: 44.4 % (ref 39.0–52.0)
HEMOGLOBIN: 15.5 g/dL (ref 13.0–17.0)
LYMPHS ABS: 1.3 10*3/uL (ref 0.7–4.0)
LYMPHS PCT: 18 %
MCH: 28.1 pg (ref 26.0–34.0)
MCHC: 34.9 g/dL (ref 30.0–36.0)
MCV: 80.6 fL (ref 78.0–100.0)
Monocytes Absolute: 0.7 10*3/uL (ref 0.1–1.0)
Monocytes Relative: 10 %
NEUTROS PCT: 71 %
Neutro Abs: 5.2 10*3/uL (ref 1.7–7.7)
Platelets: 172 10*3/uL (ref 150–400)
RBC: 5.51 MIL/uL (ref 4.22–5.81)
RDW: 17.6 % — ABNORMAL HIGH (ref 11.5–15.5)
WBC: 7.3 10*3/uL (ref 4.0–10.5)

## 2016-01-27 LAB — COMPREHENSIVE METABOLIC PANEL
ALK PHOS: 86 U/L (ref 38–126)
ALT: 17 U/L (ref 17–63)
AST: 27 U/L (ref 15–41)
Albumin: 4.1 g/dL (ref 3.5–5.0)
Anion gap: 10 (ref 5–15)
BILIRUBIN TOTAL: 1.1 mg/dL (ref 0.3–1.2)
BUN: 13 mg/dL (ref 6–20)
CALCIUM: 8.9 mg/dL (ref 8.9–10.3)
CO2: 22 mmol/L (ref 22–32)
CREATININE: 1.31 mg/dL — AB (ref 0.61–1.24)
Chloride: 101 mmol/L (ref 101–111)
GFR, EST NON AFRICAN AMERICAN: 60 mL/min — AB (ref >60)
Glucose, Bld: 95 mg/dL (ref 65–99)
Potassium: 3.7 mmol/L (ref 3.5–5.1)
Sodium: 133 mmol/L — ABNORMAL LOW (ref 135–145)
Total Protein: 7.3 g/dL (ref 6.5–8.1)

## 2016-01-27 MED ORDER — HEPARIN SOD (PORK) LOCK FLUSH 100 UNIT/ML IV SOLN
500.0000 [IU] | Freq: Once | INTRAVENOUS | Status: AC
  Administered 2016-01-27: 500 [IU] via INTRAVENOUS

## 2016-01-27 MED ORDER — HEPARIN SOD (PORK) LOCK FLUSH 100 UNIT/ML IV SOLN
INTRAVENOUS | Status: AC
  Filled 2016-01-27: qty 5

## 2016-01-27 MED ORDER — DRONABINOL 5 MG PO CAPS: 5.0000 mg | ORAL_CAPSULE | Freq: Two times a day (BID) | ORAL | 3 refills | Status: DC

## 2016-01-27 MED ORDER — SODIUM CHLORIDE 0.9 % IV SOLN
INTRAVENOUS | Status: DC
  Administered 2016-01-27: 11:00:00 via INTRAVENOUS

## 2016-01-27 NOTE — Patient Instructions (Signed)
[place] at [facility] Discharge Instructions  RECOMMENDATIONS MADE BY THE CONSULTANT AND ANY TEST RESULTS WILL BE SENT TO YOUR REFERRING PHYSICIAN.  HOLD chemo x 1 week. Hydration fluids today. Kamare Caspers RX for Marinol given to you. Return as scheduled.  Thank you for choosing [place] at [facility] to provide your oncology and hematology care.  To afford each patient quality time with our provider, please arrive at least 15 minutes before your scheduled appointment time.   Beginning January 23rd 2017 lab work for the [facility] will be done in the  Main lab at [facility] on 1st floor. If you have a lab appointment with the [place] please come in thru the  Main Entrance and check in at the main information desk  You need to re-schedule your appointment should you arrive 10 or more minutes late.  We strive to give you quality time with our providers, and arriving late affects you and other patients whose appointments are after yours.  Also, if you no show three or more times for appointments you may be dismissed from the clinic at the providers discretion.     Again, thank you for choosing [facility]  Our hope is that these requests will decrease the amount of time that you wait before being seen by our physicians.       _____________________________________________________________  Should you have questions after your visit to [facility], please contact our office at [phone] between the hours of 8:30 a.m. and 4:30 p.m.  Voicemails left after 4:30 p.m. will not be returned until the following business day.  For prescription refill requests, have your pharmacy contact our office.         Resources For Cancer Patients and their Caregivers ? American Cancer Society: Can assist with transportation, wigs, general needs, runs Look Good Feel Better.        [phone] ? Cancer Care: Provides  financial assistance, online support groups, medication/co-pay assistance.  1-800-813-HOPE (808) 704-3881) ? [place] Assists [place] cancer patients and their families through emotional , educational and financial support.  657-704-7441 ? [place] DSS Where to apply for food stamps, Medicaid and utility assistance. 367-709-5379 ? RCATS: Transportation to medical appointments. 226-164-3977 ? Social Security Administration: May apply for disability if have a Stage IV cancer. 9078568566 [phone] ? [employer], Disability and Transit Services: Assists with nutrition, care and transit needs. [place] Support Programs: @10RELATIVEDAYS @ > Cancer Support Group  2nd Tuesday of the month 1pm-2pm, Journey Room  > Creative Journey  3rd Tuesday of the month 1130am-1pm, Journey Room  > Look Good Feel Better  1st Wednesday of the month 10am-12 noon, Journey Room (Call [place] to register [phone])

## 2016-01-27 NOTE — Progress Notes (Signed)
This encounter was created in error - please disregard.

## 2016-01-27 NOTE — Progress Notes (Signed)
No chemo treatment today.Hydration fluids only. Tolerated IV fluids well. Ambulatory on discharge home with wife.

## 2016-01-30 ENCOUNTER — Other Ambulatory Visit: Payer: Self-pay

## 2016-01-30 ENCOUNTER — Emergency Department (HOSPITAL_COMMUNITY): Payer: BLUE CROSS/BLUE SHIELD

## 2016-01-30 ENCOUNTER — Emergency Department (HOSPITAL_COMMUNITY)
Admission: EM | Admit: 2016-01-30 | Discharge: 2016-01-30 | Disposition: A | Discharge status: Home / Self Care | Payer: BLUE CROSS/BLUE SHIELD | Attending: Emergency Medicine | Admitting: Emergency Medicine

## 2016-01-30 ENCOUNTER — Telehealth (HOSPITAL_COMMUNITY): Payer: Self-pay

## 2016-01-30 ENCOUNTER — Encounter (HOSPITAL_COMMUNITY): Payer: Self-pay | Admitting: Emergency Medicine

## 2016-01-30 DIAGNOSIS — Z79899 Other long term (current) drug therapy: Secondary | ICD-10-CM | POA: Diagnosis not present

## 2016-01-30 DIAGNOSIS — R0602 Shortness of breath: Secondary | ICD-10-CM | POA: Insufficient documentation

## 2016-01-30 DIAGNOSIS — Z791 Long term (current) use of non-steroidal anti-inflammatories (NSAID): Secondary | ICD-10-CM | POA: Insufficient documentation

## 2016-01-30 DIAGNOSIS — R0600 Dyspnea, unspecified: Secondary | ICD-10-CM

## 2016-01-30 DIAGNOSIS — R1310 Dysphagia, unspecified: Secondary | ICD-10-CM | POA: Diagnosis not present

## 2016-01-30 DIAGNOSIS — Z87891 Personal history of nicotine dependence: Secondary | ICD-10-CM | POA: Insufficient documentation

## 2016-01-30 DIAGNOSIS — I10 Essential (primary) hypertension: Secondary | ICD-10-CM | POA: Diagnosis not present

## 2016-01-30 LAB — CBC WITH DIFFERENTIAL/PLATELET
BASOS ABS: 0 10*3/uL (ref 0.0–0.1)
BASOS PCT: 0 %
EOS ABS: 0.1 10*3/uL (ref 0.0–0.7)
Eosinophils Relative: 1 %
HCT: 43.8 % (ref 39.0–52.0)
HEMOGLOBIN: 15.2 g/dL (ref 13.0–17.0)
Lymphocytes Relative: 18 %
Lymphs Abs: 1.7 10*3/uL (ref 0.7–4.0)
MCH: 28.1 pg (ref 26.0–34.0)
MCHC: 34.7 g/dL (ref 30.0–36.0)
MCV: 81.1 fL (ref 78.0–100.0)
MONO ABS: 1 10*3/uL (ref 0.1–1.0)
MONOS PCT: 10 %
NEUTROS ABS: 6.8 10*3/uL (ref 1.7–7.7)
NEUTROS PCT: 71 %
Platelets: 229 10*3/uL (ref 150–400)
RBC: 5.4 MIL/uL (ref 4.22–5.81)
RDW: 17.6 % — AB (ref 11.5–15.5)
WBC: 9.5 10*3/uL (ref 4.0–10.5)

## 2016-01-30 LAB — COMPREHENSIVE METABOLIC PANEL
ALBUMIN: 4.2 g/dL (ref 3.5–5.0)
ALT: 14 U/L — ABNORMAL LOW (ref 17–63)
ANION GAP: 8 (ref 5–15)
AST: 26 U/L (ref 15–41)
Alkaline Phosphatase: 82 U/L (ref 38–126)
BUN: 11 mg/dL (ref 6–20)
CHLORIDE: 104 mmol/L (ref 101–111)
CO2: 19 mmol/L — AB (ref 22–32)
Calcium: 8.9 mg/dL (ref 8.9–10.3)
Creatinine, Ser: 1.18 mg/dL (ref 0.61–1.24)
GFR calc Af Amer: >60 mL/min (ref >60)
GFR calc non Af Amer: >60 mL/min (ref >60)
GLUCOSE: 90 mg/dL (ref 65–99)
POTASSIUM: 3.5 mmol/L (ref 3.5–5.1)
SODIUM: 131 mmol/L — AB (ref 135–145)
Total Bilirubin: 1 mg/dL (ref 0.3–1.2)
Total Protein: 7.3 g/dL (ref 6.5–8.1)

## 2016-01-30 LAB — BRAIN NATRIURETIC PEPTIDE: B Natriuretic Peptide: 396 pg/mL — ABNORMAL HIGH (ref 0.0–100.0)

## 2016-01-30 LAB — TROPONIN I: Troponin I: <0.03 ng/mL (ref <0.03)

## 2016-01-30 MED ORDER — HEPARIN SOD (PORK) LOCK FLUSH 100 UNIT/ML IV SOLN
INTRAVENOUS | Status: AC
  Administered 2016-01-30: 19:00:00
  Filled 2016-01-30: qty 5

## 2016-01-30 MED ORDER — ZOLPIDEM TARTRATE 10 MG PO TABS: 10.0000 mg | ORAL_TABLET | Freq: Every evening | ORAL | 0 refills | Status: DC | PRN

## 2016-01-30 MED ORDER — SODIUM CHLORIDE 0.9 % IV BOLUS (SEPSIS)
500.0000 mL | Freq: Once | INTRAVENOUS | Status: AC
  Administered 2016-01-30: 500 mL via INTRAVENOUS

## 2016-01-30 NOTE — Discharge Instructions (Signed)
You were seen in the ED today with difficulty breathing and swallowing. We evaluated you closely and spoke with your oncologist. Call for appointment this week or see on Friday as scheduled. We are refilling your Ambien to assist with sleep.

## 2016-01-30 NOTE — Telephone Encounter (Signed)
Called patients wife. She states patient is not eating and hasn't been all weekend. He cannot swallow and is SOB. Instructed wife to take patient to emergency room. She verbalized understanding.

## 2016-01-30 NOTE — Telephone Encounter (Signed)
-----   Message from Louis Meckel sent at 01/30/2016  1:54 PM EDT ----- Regarding: Please call Patients wife called and left message,  please call patients wife

## 2016-01-30 NOTE — ED Triage Notes (Signed)
PT states recent weight loss of 12 lbs in the past two weeks with abnormal lab values. PT states currently on chemo treatments for colon cancer and chemo was held this past Friday d/t abnormal labs. PT also states new dry cough and generalized weakness worsening the past week. PT denies any chest pain.

## 2016-01-30 NOTE — ED Provider Notes (Signed)
Emergency Department Provider Note   I have reviewed the triage vital signs and the nursing notes.   HISTORY  Chief Complaint Shortness of Breath   HPI Derek Rodriguez is a 55 y.o. 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 The patient ports new onset difficulty breathing with no clear inciting event. He reports dyspnea both at rest and with exertion. No associated chest pain. He denies fever or productive cough. No prior history of breathing problems.   Patient states he is also having significant trouble with eating food. He has had significant weight loss and feels as if there is some impediment to his food being swallowed. He reports eating and then having almost immediate nausea with vomiting. He is able to drink fluids and water and small amounts. He notes difficulty sleeping and restlessness. No associated abdominal discomfort. He denies no blood in his emesis or stool. He does continue to have bowel movements with no diarrhea. Last chemotherapy was 2 weeks ago. He reports using Zofran, Phenergan, Nulasta injections without relief in symptoms.    Past Medical History:  Diagnosis Date  . 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 Arthritis   . Diverticulitis   . Hypertension     Patient Active Problem List   Diagnosis Date Noted  . Dehydration 12/30/2015  . Depression 12/15/2015  . Insomnia 11/15/2015  . 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 S/P partial colectomy 10/03/2015  . Protein-calorie malnutrition, severe 09/10/2015  . Acute diverticulitis 09/09/2015  . Diverticulitis of colon with perforation 09/09/2015  . Colonic diverticular abscess 09/09/2015    Past Surgical History:  Procedure Laterality Date  . PARTIAL COLECTOMY N/A 10/03/2015   Procedure: PARTIAL COLECTOMY;  Surgeon: Aviva Signs, MD;  Location: [facility]  ORS;  Service: General;  Laterality: N/A;  . PORTACATH PLACEMENT N/A 11/18/2015   Procedure: INSERTION PORT-A-CATH LEFT SUBCLAVIAN;  Surgeon: Aviva Signs, MD;  Location: [facility] ORS;  Service: General;  Laterality: N/A;    Current Outpatient Rx  . Order #: [id number] Class: Print  . Order #: [id number] Class: Normal  . Order #: [id number] Class: Historical Med  . Order #: [id number] Class: Normal  . Order #: [id number] Class: Historical Med  . Order #: [id number] Class: Print  . Order #: [id number] Class: Normal  . Order #: [id number] Class: Normal  . Order #: [id number] Class: Print  . Order #: [id number] Class: Historical Med  . Order #: [id number] Class: Normal  . Order #: [id number] Class: Historical Med  . Order #: [id number] Class: Normal  . Order #: [id number] Class: Normal  . Order #: [id number] Class: Historical Med  . Order #: [id number] Class: Print  . Order #: [id number] Class: Normal  . Order #: [id number] Class: Print    Allergies Review of patient's allergies indicates no known allergies.  Family History  Problem Relation Age of Onset  . Diabetes Father     Social History Social History  Substance Use Topics  . Smoking status: Former Smoker    Types: 42, Pipe    Quit date: 09/26/1985  . Smokeless tobacco: Former Systems developer    Types: Snuff    Quit date: 06/29/2015  . Alcohol use No     Comment: occ    Review of Systems  Constitutional: No fever/chills Eyes: No visual changes. ENT: No sore throat; Positive difficulty swallowing.  Cardiovascular: Denies chest pain. Respiratory: Positive shortness of breath. Gastrointestinal: No abdominal pain. Positive nausea and vomiting.  No diarrhea.  No constipation. Genitourinary: Negative for dysuria. Musculoskeletal: Negative for back pain. Skin: Negative for rash. Neurological: Negative for headaches, focal weakness or numbness.  10-point ROS otherwise negative.  ____________________________________________   PHYSICAL EXAM:  VITAL SIGNS: ED  Triage Vitals  Enc Vitals Group     BP 01/30/16 1533 126/86     Pulse Rate 01/30/16 1533 89     Resp 01/30/16 1533 17     Temp 01/30/16 1533 97.9 F (36.6 C)     Temp Source 01/30/16 1533 Oral     SpO2 01/30/16 1533 99 %     Weight 01/30/16 1534 212 lb (96.2 kg)     Height 01/30/16 1534 6' (1.829 m)     Pain Score 01/30/16 1532 0   Constitutional: Alert and oriented. Well appearing and in no acute distress. Eyes: Conjunctivae are normal. PERRL. EOMI. Head: Atraumatic. Nose: No congestion/rhinnorhea. Mouth/Throat: Mucous membranes are dry. Oropharynx non-erythematous. Neck: No stridor.   Cardiovascular: Normal rate, regular rhythm. Good peripheral circulation. Grossly normal heart sounds.   Respiratory: Normal respiratory effort.  No retractions. Lungs CTAB. Gastrointestinal: Soft and nontender. No distention.  Musculoskeletal: No lower extremity tenderness nor edema. No gross deformities of extremities. Neurologic:  Normal speech and language. No gross focal neurologic deficits are appreciated.  Skin:  Skin is warm, dry and intact. No rash noted. Psychiatric: Mood and affect are normal. Speech and behavior are normal.  ____________________________________________   LABS (all labs ordered are listed, but only abnormal results are displayed)  Labs Reviewed  CBC WITH DIFFERENTIAL/PLATELET - Abnormal; Notable for the following:       Result Value   RDW 17.6 (*)    All other components within normal limits  COMPREHENSIVE METABOLIC PANEL - Abnormal; Notable for the following:    Sodium 131 (*)    CO2 19 (*)    ALT 14 (*)    All other components within normal limits  BRAIN NATRIURETIC PEPTIDE - Abnormal; Notable for the following:    B Natriuretic Peptide 396.0 (*)    All other components within normal limits  TROPONIN I   ____________________________________________  EKG  Reviewed. No STEMI.  ____________________________________________  RADIOLOGY  Dg Neck Soft  Tissue  Result Date: 01/30/2016 CLINICAL DATA:  New cough.  Colon carcinoma. EXAM: NECK SOFT TISSUES - 1+ VIEW COMPARISON:  None. FINDINGS: Frontal and lateral views were obtained. The epiglottis and aryepiglottic folds appear normal. Prevertebral soft tissues are normal. No air-fluid level to suggest abscess. There is extensive degenerative change in the mid cervical spine. Tongue base region appears normal. Visualized tracheal air column appears normal. IMPRESSION: Multilevel degenerative change in the cervical spine. No soft tissue lesion. Epiglottis and aryepiglottic folds appear normal. Visualized tracheal air column appears normal. Electronically Signed   By: Lowella Grip III M.D.   On: 01/30/2016 17:37   Dg Chest 2 View  Result Date: 01/30/2016 CLINICAL DATA:  Nonproductive cough and shortness of breath, several weeks duration. Symptoms worsening today. EXAM: CHEST  2 VIEW COMPARISON:  11/18/2015 FINDINGS: Heart size is normal. Mediastinal shadows are normal. Power port inserted from a left subclavian approach has its tip in the SVC at the azygos level. The lungs are clear. The vascularity is normal. No effusions. No bone abnormality. IMPRESSION: No active disease. Electronically Signed   By: Nelson Chimes M.D.   On: 01/30/2016 16:07    ____________________________________________   PROCEDURES  Procedure(s) performed:   Procedures  None ____________________________________________   INITIAL IMPRESSION / ASSESSMENT  AND PLAN / ED COURSE  Pertinent labs & imaging results that were available during my care of the patient were reviewed by me and considered in my medical decision making (see chart for details).  Patient presents to the emergency department for evaluation of dyspnea and difficulty swallowing with severe nausea and weight loss in the setting of colon cancer currently on chemotherapy. Patient is afebrile in the emergency department. He is in no acute distress. No  hypoxemia. The patient has multiple nausea medications at home which she has been using without relief in symptoms. Feels as if there is some kind of obstruction to his swallowing. Oropharynx appears normal. My suspicion for PE in this patient is low with normal vitals and no chest pain although he is at somewhat higher risk with cancer diagnosis. Last chemotherapy was 2 weeks prior. CXR unremarkable. Will obtain x-ray of the neck (soft tissue) with low suspicion for retropharyngeal mass or epiglottitis.   06:34 PM Spoke with Dr. Sheldon Silvan who is scheduled to see the patient this Friday. Will Refill Ambien Rx and discharge at this time. Labs and imaging are reassuring. Discussed return precautions in detail with the patient and family. They are comfortable with the plan for discharge and return as needed for worsening symptoms. No clinical evidence of heart failure or edema on CXR.   At this time, I do not feel there is any life-threatening condition present. I have reviewed and discussed all results (EKG, imaging, lab, urine as appropriate), exam findings with patient. I have reviewed nursing notes and appropriate previous records.  I feel the patient is safe to be discharged home without further emergent workup. Discussed usual and customary return precautions. Patient and family (if present) verbalize understanding and are comfortable with this plan.  Patient will follow-up with their primary care provider. If they do not have a primary care provider, information for follow-up has been provided to them. All questions have been answered.  ____________________________________________  FINAL CLINICAL IMPRESSION(S) / ED DIAGNOSES  Final diagnoses:  Dyspnea  Difficulty swallowing     MEDICATIONS GIVEN DURING THIS VISIT:  Medications  sodium chloride 0.9 % bolus 500 mL (0 mLs Intravenous Stopped 01/30/16 1828)     NEW OUTPATIENT MEDICATIONS STARTED DURING THIS VISIT:  New Prescriptions    ZOLPIDEM (AMBIEN) 10 MG TABLET    Take 1 tablet (10 mg total) by mouth at bedtime as needed for sleep.      Note:  This document was prepared using Dragon voice recognition software and may include unintentional dictation errors.  Nanda Quinton, MD Emergency Medicine   Margette Fast, MD 01/30/16 [phone]

## 2016-01-31 ENCOUNTER — Encounter (HOSPITAL_COMMUNITY): Payer: Self-pay | Admitting: Hematology & Oncology

## 2016-01-31 ENCOUNTER — Other Ambulatory Visit (HOSPITAL_COMMUNITY): Payer: Self-pay

## 2016-01-31 ENCOUNTER — Encounter (HOSPITAL_COMMUNITY): Payer: Self-pay

## 2016-01-31 DIAGNOSIS — G4700 Insomnia, unspecified: Secondary | ICD-10-CM

## 2016-01-31 DIAGNOSIS — C187 Malignant neoplasm of sigmoid colon: Secondary | ICD-10-CM

## 2016-01-31 MED ORDER — ZOLPIDEM TARTRATE 10 MG PO TABS: 10.0000 mg | ORAL_TABLET | Freq: Every evening | ORAL | 3 refills | Status: DC | PRN

## 2016-01-31 MED ORDER — ZOLPIDEM TARTRATE 10 MG PO TABS: 10.0000 mg | ORAL_TABLET | Freq: Every evening | ORAL | 0 refills | Status: DC | PRN

## 2016-01-31 NOTE — Progress Notes (Unsigned)
Patient's wife was called and we discussed his dysphagia and ER visit last night, 01/30/2016.  Robynn Pane, PA ordered for him to have a GI referral.  Amy, scheduler, sent referral and patient is aware.  Refill auth for Ambien sent to [facility] via fax as well.

## 2016-02-02 ENCOUNTER — Encounter: Payer: Self-pay | Admitting: Internal Medicine

## 2016-02-03 ENCOUNTER — Ambulatory Visit (HOSPITAL_COMMUNITY): Payer: BLUE CROSS/BLUE SHIELD

## 2016-02-03 ENCOUNTER — Encounter (HOSPITAL_BASED_OUTPATIENT_CLINIC_OR_DEPARTMENT_OTHER): Payer: BLUE CROSS/BLUE SHIELD | Admitting: Oncology

## 2016-02-03 ENCOUNTER — Ambulatory Visit (HOSPITAL_COMMUNITY): Payer: BLUE CROSS/BLUE SHIELD | Admitting: Hematology & Oncology

## 2016-02-03 ENCOUNTER — Encounter (HOSPITAL_BASED_OUTPATIENT_CLINIC_OR_DEPARTMENT_OTHER): Payer: BLUE CROSS/BLUE SHIELD

## 2016-02-03 VITALS — BP 120/76 | HR 75 | Temp 98.2°F | Resp 16 | Wt 205.6 lb

## 2016-02-03 DIAGNOSIS — C187 Malignant neoplasm of sigmoid colon: Secondary | ICD-10-CM

## 2016-02-03 DIAGNOSIS — C779 Secondary and unspecified malignant neoplasm of lymph node, unspecified: Secondary | ICD-10-CM | POA: Diagnosis not present

## 2016-02-03 DIAGNOSIS — Z5111 Encounter for antineoplastic chemotherapy: Secondary | ICD-10-CM | POA: Diagnosis not present

## 2016-02-03 LAB — CBC WITH DIFFERENTIAL/PLATELET
Basophils Absolute: 0 10*3/uL (ref 0.0–0.1)
Basophils Relative: 1 %
Eosinophils Absolute: 0 10*3/uL (ref 0.0–0.7)
Eosinophils Relative: 1 %
HEMATOCRIT: 45.4 % (ref 39.0–52.0)
HEMOGLOBIN: 16.1 g/dL (ref 13.0–17.0)
LYMPHS ABS: 1.1 10*3/uL (ref 0.7–4.0)
Lymphocytes Relative: 16 %
MCH: 28.9 pg (ref 26.0–34.0)
MCHC: 35.5 g/dL (ref 30.0–36.0)
MCV: 81.4 fL (ref 78.0–100.0)
MONOS PCT: 12 %
Monocytes Absolute: 0.8 10*3/uL (ref 0.1–1.0)
NEUTROS ABS: 4.8 10*3/uL (ref 1.7–7.7)
NEUTROS PCT: 70 %
Platelets: 278 10*3/uL (ref 150–400)
RBC: 5.58 MIL/uL (ref 4.22–5.81)
RDW: 19.1 % — ABNORMAL HIGH (ref 11.5–15.5)
WBC: 6.7 10*3/uL (ref 4.0–10.5)

## 2016-02-03 LAB — COMPREHENSIVE METABOLIC PANEL
ALK PHOS: 86 U/L (ref 38–126)
ALT: 21 U/L (ref 17–63)
ANION GAP: 15 (ref 5–15)
AST: 34 U/L (ref 15–41)
Albumin: 4.3 g/dL (ref 3.5–5.0)
BILIRUBIN TOTAL: 1.2 mg/dL (ref 0.3–1.2)
BUN: 18 mg/dL (ref 6–20)
CALCIUM: 9.4 mg/dL (ref 8.9–10.3)
CO2: 19 mmol/L — ABNORMAL LOW (ref 22–32)
Chloride: 101 mmol/L (ref 101–111)
Creatinine, Ser: 1.28 mg/dL — ABNORMAL HIGH (ref 0.61–1.24)
GFR calc non Af Amer: >60 mL/min (ref >60)
Glucose, Bld: 93 mg/dL (ref 65–99)
Potassium: 3.7 mmol/L (ref 3.5–5.1)
Sodium: 135 mmol/L (ref 135–145)
TOTAL PROTEIN: 7.7 g/dL (ref 6.5–8.1)

## 2016-02-03 LAB — BRAIN NATRIURETIC PEPTIDE: B Natriuretic Peptide: 19 pg/mL (ref 0.0–100.0)

## 2016-02-03 MED ORDER — SODIUM CHLORIDE 0.9 % IV SOLN
Freq: Once | INTRAVENOUS | Status: AC
  Administered 2016-02-03: 11:00:00 via INTRAVENOUS
  Filled 2016-02-03: qty 5

## 2016-02-03 MED ORDER — LORAZEPAM 2 MG/ML IJ SOLN
INTRAMUSCULAR | Status: AC
  Filled 2016-02-03: qty 1

## 2016-02-03 MED ORDER — DEXTROSE 5 % IV SOLN
Freq: Once | INTRAVENOUS | Status: AC
  Administered 2016-02-03: 12:00:00 via INTRAVENOUS

## 2016-02-03 MED ORDER — SODIUM CHLORIDE 0.9% FLUSH: 10.0000 mL | INTRAVENOUS | Status: DC | PRN

## 2016-02-03 MED ORDER — PALONOSETRON HCL INJECTION 0.25 MG/5ML
INTRAVENOUS | Status: AC
  Filled 2016-02-03: qty 5

## 2016-02-03 MED ORDER — LEUCOVORIN CALCIUM INJECTION 350 MG
400.0000 mg/m2 | Freq: Once | INTRAMUSCULAR | Status: AC
  Administered 2016-02-03: 920 mg via INTRAVENOUS
  Filled 2016-02-03: qty 46

## 2016-02-03 MED ORDER — PALONOSETRON HCL INJECTION 0.25 MG/5ML
0.2500 mg | Freq: Once | INTRAVENOUS | Status: AC
  Administered 2016-02-03: 0.25 mg via INTRAVENOUS

## 2016-02-03 MED ORDER — LORAZEPAM 2 MG/ML IJ SOLN
0.5000 mg | Freq: Once | INTRAMUSCULAR | Status: AC
  Administered 2016-02-03: 0.5 mg via INTRAVENOUS

## 2016-02-03 MED ORDER — OXALIPLATIN CHEMO INJECTION 100 MG/20ML
68.0000 mg/m2 | Freq: Once | INTRAVENOUS | Status: AC
  Administered 2016-02-03: 155 mg via INTRAVENOUS
  Filled 2016-02-03: qty 31

## 2016-02-03 MED ORDER — SODIUM CHLORIDE 0.9 % IV SOLN
2400.0000 mg/m2 | INTRAVENOUS | Status: DC
  Administered 2016-02-03: 5500 mg via INTRAVENOUS
  Filled 2016-02-03: qty 110

## 2016-02-03 MED ORDER — HEPARIN SOD (PORK) LOCK FLUSH 100 UNIT/ML IV SOLN
INTRAVENOUS | Status: AC
  Filled 2016-02-03: qty 5

## 2016-02-03 NOTE — Progress Notes (Signed)
Patient tolerated infusion well.  VSS.  Ambulatory and stable upon discharge from clinic.

## 2016-02-03 NOTE — Patient Instructions (Signed)
[place] at [facility] Discharge Instructions  RECOMMENDATIONS MADE BY THE CONSULTANT AND ANY TEST RESULTS WILL BE SENT TO YOUR REFERRING PHYSICIAN.  You saw Kirby Crigler, PA-C, today. Gi consult Monday at 9 am. Return for follow up in 2 weeks.  Thank you for choosing [place] at [facility] to provide your oncology and hematology care.  To afford each patient quality time with our provider, please arrive at least 15 minutes before your scheduled appointment time.   Beginning January 23rd 2017 lab work for the [facility] will be done in the  Main lab at [facility] on 1st floor. If you have a lab appointment with the [facility] please come in thru the  Main Entrance and check in at the main information desk  You need to re-schedule your appointment should you arrive 10 or more minutes late.  We strive to give you quality time with our providers, and arriving late affects you and other patients whose appointments are after yours.  Also, if you no show three or more times for appointments you may be dismissed from the clinic at the providers discretion.     Again, thank you for choosing [facility].  Our hope is that these requests will decrease the amount of time that you wait before being seen by our physicians.       _____________________________________________________________  Should you have questions after your visit to [facility], please contact our office at [phone] between the hours of 8:30 a.m. and 4:30 p.m.  Voicemails left after 4:30 p.m. will not be returned until the following business day.  For prescription refill requests, have your pharmacy contact our office.         Resources For Cancer Patients and their Caregivers ? American Cancer Society: Can assist with transportation, wigs, general needs, runs Look Good Feel Better.        517-884-7709 ? Cancer Care: Provides financial  assistance, online support groups, medication/co-pay assistance.  1-800-813-HOPE 260-500-9996) ? [place] Assists [place] cancer patients and their families through emotional , educational and financial support.  903-779-8066 ? [place] DSS Where to apply for food stamps, Medicaid and utility assistance. 949-546-2026 ? [employer]ATS: Transportation to medical appointments. (845) 281-0958 ? Social Security Administration: May apply for disability if have a Stage IV cancer. 330 466 2376 240-015-4511 ? [employer], Disability and Transit Services: Assists with nutrition, care and transit needs. [place] Support Programs: @10RELATIVEDAYS @ > Cancer Support Group  2nd Tuesday of the month 1pm-2pm, Journey Room  > Creative Journey  3rd Tuesday of the month 1130am-1pm, Journey Room  > Look Good Feel Better  1st Wednesday of the month 10am-12 noon, Journey Room (Call [place] to register [phone])

## 2016-02-03 NOTE — Patient Instructions (Signed)
[facility] Discharge Instructions for Patients Receiving Chemotherapy   Beginning January 23rd 2017 lab work for the Cancer Center will be done in the  Main lab at [facility] on 1st floor. If you have a lab appointment with the Cancer Center please come in thru the  Main Entrance and check in at the main information desk   Today you received the following chemotherapy agents: Oxaliplatin, leucovorin, and fluorouracil.     If you develop nausea and vomiting, or diarrhea that is not controlled by your medication, call the clinic.  The clinic phone number is [phone]. Office hours are Monday-Friday 8:30am-5:00pm.  BELOW ARE SYMPTOMS THAT SHOULD BE REPORTED IMMEDIATELY:  *FEVER GREATER THAN 101.0 F  *CHILLS WITH OR WITHOUT FEVER  NAUSEA AND VOMITING THAT IS NOT CONTROLLED WITH YOUR NAUSEA MEDICATION  *UNUSUAL SHORTNESS OF BREATH  *UNUSUAL BRUISING OR BLEEDING  TENDERNESS IN MOUTH AND THROAT WITH OR WITHOUT PRESENCE OF ULCERS  *URINARY PROBLEMS  *BOWEL PROBLEMS  UNUSUAL RASH Items with * indicate a potential emergency and should be followed up as soon as possible. If you have an emergency after office hours please contact your primary care physician or go to the nearest emergency department.  Please call the clinic during office hours if you have any questions or concerns.   You may also contact the Patient Navigator at [phone] should you have any questions or need assistance in obtaining follow up care.      Resources For Cancer Patients and their Caregivers ? American Cancer Society: Can assist with transportation, wigs, general needs, runs Look Good Feel Better.        1-888-227-6333 ? Cancer Care: Provides financial assistance, online support groups, medication/co-pay assistance.  1-800-813-HOPE (4673) ? [facility] Assists [place] cancer patients and their families through emotional , educational and  financial support.  336-427-4357 ? [place] DSS Where to apply for food stamps, Medicaid and utility assistance. 336-342-1394 ? [facility]: Transportation to medical appointments. 336-347-2287 ? Social Security Administration: May apply for disability if have a Stage IV cancer. 336-342-7796 1-800-772-1213 ? [place] Aging, Disability and Transit Services: Assists with nutrition, care and transit needs. 336-349-2343          

## 2016-02-03 NOTE — Progress Notes (Signed)
[place] [id number]  Adenocarcinoma of sigmoid colon [facility])  CURRENT THERAPY: FOLFOX beginning on 11/25/2015 with multiple dose reductions secondary to side effects.  INTERVAL HISTORY: Derek Rodriguez 56 y.o. 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 Arnoldo Morale.    Adenocarcinoma of sigmoid colon ([place])   09/09/2015 - 09/12/2015 Hospital Admission    Diverticulitis of colon with perforation      09/12/2015 Imaging    CT abd/pelvis- Continued severe inflammation of the proximal half of the sigmoid colon. There is a 2.6 cm oval low-attenuation structure extending between the posterior wall of the inflamed sigmoid colon and the dome of the bladder....      09/27/2015 Tumor Marker    CEA: 2.5       10/03/2015 Procedure    Partial colectomy, splenic flexure takedown by Dr. Arnoldo Morale      10/03/2015 Pathology Results    FULL THICKNESS INVASIVE MODERATELY TO POORLY DIFFERENTIATED ADENOCARCINOMA WITH SURFACE ULCERATION. TUMOR INVADES THROUGH MUSCULARIS MUCOSA AND PERFORATES SEROSAL SURFACE. - TUMOR MEASURES 10.3 CM IN GREATEST DIMENSION. - EXTENSIVE LYMPH/VASCULAR INVASI      10/13/2015 Pathology Results    MSI STABLE      10/20/2015 Imaging    CT chest- Negative. No evidence of metastatic disease or other active disease within the thorax.      11/04/2015 - 11/04/2015 Chemotherapy    [facility]      11/12/2015 Miscellaneous    ED visit with nausea and decreased po intake      11/18/2015 Procedure    Port-A-Cath insertion by Dr. Arnoldo Morale      11/25/2015 Treatment Plan Change    From Xelox to FOLFOX      11/25/2015 -  Chemotherapy    FOLFOX      01/06/2016 Treatment Plan Change    Oxaliplatin dose reduced by 20% for cycle #4 and 5FU bolus was discontinued.       He presented to the ED on 01/30/2016 for shortness of breath.  Work-up was negative and he was discharged from the  ED.  He complains of dysphagia to ALL foods, even soft foods such as applesauce, pudding, jello.  He reports he can "sip" liquids.  He notes that he can take 1-2 bites of food and it "comes back up."  He denies vomiting.  He reports that the food he brings up has not made its way into the stomach.  He notes that all foods do not have an appealing taste.  He is trying to force foods, but this is complicated by the aforementioned dysphagia.  Review of Systems  Constitutional: Positive for weight loss. Negative for chills and fever.  HENT: Negative.   Eyes: Negative.   Respiratory: Negative.   Cardiovascular: Negative.   Gastrointestinal: Positive for nausea (Sunday-Tuesday.  Improved today.). Negative for constipation, diarrhea and vomiting.  Genitourinary: Negative.   Musculoskeletal: Negative.   Skin: Negative.   Neurological: Positive for weakness. Negative for tingling.  Endo/Heme/Allergies: Negative.   Psychiatric/Behavioral: Negative.     Past Medical History:  Diagnosis Date  . Adenocarcinoma of sigmoid colon ([place]) 10/18/2015  . Arthritis   . Diverticulitis   . Hypertension     Past Surgical History:  Procedure Laterality Date  . PARTIAL COLECTOMY N/A 10/03/2015   Procedure: PARTIAL COLECTOMY;  Surgeon: Aviva Signs, MD;  Location: [facility];  Service: General;  Laterality: N/A;  .  PORTACATH PLACEMENT N/A 11/18/2015   Procedure: INSERTION PORT-A-CATH LEFT SUBCLAVIAN;  Surgeon: Aviva Signs, MD;  Location: [facility];  Service: General;  Laterality: N/A;    Family History  Problem Relation Age of Onset  . Diabetes Father     Social History   Social History  . Marital status: Married    Spouse name: N/A  . Number of children: N/A  . Years of education: N/A   Social History Main Topics  . Smoking status: Former Smoker    Types: 41, Pipe    Quit date: 09/26/1985  . Smokeless tobacco: Former Systems developer    Types: Snuff    Quit date: 06/29/2015  . Alcohol use No     Comment:  occ  . Drug use: No  . Sexual activity: Yes    Birth control/ protection: None   Other Topics Concern  . Not on file   Social History Narrative  . No narrative on file     PHYSICAL EXAMINATION  ECOG PERFORMANCE STATUS: 1 - Symptomatic but completely ambulatory  There were no vitals filed for this visit.  BP 120/88 P 98 R 18 T 98.2 O2 Sat 98% on RA.  GENERAL:alert, no distress, well nourished, well developed, cooperative and accompanied by his wife, in chemo-bed. SKIN: skin color, texture, turgor are normal, no rashes or significant lesions HEAD: Normocephalic, No masses, lesions, tenderness or abnormalities EYES: normal, EOMI, Conjunctiva are pink and non-injected EARS: External ears normal OROPHARYNX:lips, buccal mucosa, and tongue normal and mucous membranes are moist  NECK: supple, trachea midline LYMPH:  no palpable lymphadenopathy BREAST:not examined LUNGS: clear to auscultation  HEART: regular rate & rhythm, no murmurs, no gallops, S1 normal and S2 normal ABDOMEN:abdomen soft and normal bowel sounds BACK: Back symmetric, no curvature. EXTREMITIES:less then 2 second capillary refill, no joint deformities, effusion, or inflammation, no skin discoloration, no clubbing, no cyanosis  NEURO: alert & oriented x 3 with fluent speech, no focal motor/sensory deficits, gait normal   LABORATORY DATA: CBC    Component Value Date/Time   WBC 6.7 02/03/2016 0915   RBC 5.58 02/03/2016 0915   HGB 16.1 02/03/2016 0915   HCT 45.4 02/03/2016 0915   PLT 278 02/03/2016 0915   MCV 81.4 02/03/2016 0915   MCH 28.9 02/03/2016 0915   MCHC 35.5 02/03/2016 0915   RDW 19.1 (H) 02/03/2016 0915   LYMPHSABS 1.1 02/03/2016 0915   MONOABS 0.8 02/03/2016 0915   EOSABS 0.0 02/03/2016 0915   BASOSABS 0.0 02/03/2016 0915      Chemistry      Component Value Date/Time   NA 135 02/03/2016 0915   K 3.7 02/03/2016 0915   CL 101 02/03/2016 0915   CO2 19 (L) 02/03/2016 0915   BUN 18  02/03/2016 0915   CREATININE 1.28 (H) 02/03/2016 0915      Component Value Date/Time   CALCIUM 9.4 02/03/2016 0915   ALKPHOS 86 02/03/2016 0915   AST 34 02/03/2016 0915   ALT 21 02/03/2016 0915   BILITOT 1.2 02/03/2016 0915      Lab Results  Component Value Date   CEA 2.5 09/27/2015     PENDING LABS:   RADIOGRAPHIC STUDIES:  Dg Neck Soft Tissue  Result Date: 01/30/2016 CLINICAL DATA:  New cough.  Colon carcinoma. EXAM: NECK SOFT TISSUES - 1+ VIEW COMPARISON:  None. FINDINGS: Frontal and lateral views were obtained. The epiglottis and aryepiglottic folds appear normal. Prevertebral soft tissues are normal. No air-fluid level to suggest abscess. There  is extensive degenerative change in the mid cervical spine. Tongue base region appears normal. Visualized tracheal air column appears normal. IMPRESSION: Multilevel degenerative change in the cervical spine. No soft tissue lesion. Epiglottis and aryepiglottic folds appear normal. Visualized tracheal air column appears normal. Electronically Signed   By: Lowella Grip III M.D.   On: 01/30/2016 17:37   Dg Chest 2 View  Result Date: 01/30/2016 CLINICAL DATA:  Nonproductive cough and shortness of breath, several weeks duration. Symptoms worsening today. EXAM: CHEST  2 VIEW COMPARISON:  11/18/2015 FINDINGS: Heart size is normal. Mediastinal shadows are normal. Power port inserted from a left subclavian approach has its tip in the SVC at the azygos level. The lungs are clear. The vascularity is normal. No effusions. No bone abnormality. IMPRESSION: No active disease. Electronically Signed   By: Nelson Chimes M.D.   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 S/P partial colectomy by Dr. Arnoldo Morale on 10/03/2015.  Now on FOLFOX adjuvant therapy beginning on 11/25/2015 with Oxaliplatin reduced by 25% after intolerance to Xelox on 10/25/2015.  Oncology history updated.  Pre-treatment labs as ordered: CBC diff, CMET.  I personally reviewed and went over laboratory results with the patient.  The results are noted within this dictation.  Labs satisfy treatment parameters today.   Chart reviewed and ED visit noted.  Referral to GI is completed and he is scheduled to see Neil Crouch, PA-C on 02/27/2016 for dysphagia.  He reports dysphagia to foods and some liquids.  Today, this is his biggest complaint.  With his ongoing weight loss and his wife insistent that this is an "emergency" I called Marylee Floras and his appointment has been moved to Monday at 9 AM (8/21) with Neil Crouch.  Return in 2 weeks for follow-up.   ORDERS PLACED FOR THIS ENCOUNTER: No orders of the defined types were placed in this encounter.   MEDICATIONS PRESCRIBED THIS ENCOUNTER: No orders of the defined types were placed in this encounter.   THERAPY PLAN:  Continue with adjuvant treatment as planned and we will need to broach HIPEC intervention at [facility] in the future.  All questions were answered. The patient knows to call the clinic with any problems, questions or concerns. We can certainly see the patient much sooner if necessary.  Patient and plan discussed with Dr. Ancil Linsey and she is in agreement with the aforementioned.   This note is electronically signed by: Doy Mince 02/03/2016 5:43 PM

## 2016-02-03 NOTE — Assessment & Plan Note (Addendum)
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 S/P partial colectomy by Dr. Arnoldo Morale on 10/03/2015.  Now on FOLFOX adjuvant therapy beginning on 11/25/2015 with Oxaliplatin reduced by 25% after intolerance to Xelox on 10/25/2015.  Oncology history updated.  Pre-treatment labs as ordered: CBC diff, CMET.  I personally reviewed and went over laboratory results with the patient.  The results are noted within this dictation.  Labs satisfy treatment parameters today.   Chart reviewed and ED visit noted.  Referral to GI is completed and he is scheduled to see Neil Crouch, PA-C on 02/27/2016 for dysphagia.  He reports dysphagia to foods and some liquids.  Today, this is his biggest complaint.  With his ongoing weight loss and his wife insistent that this is an "emergency" I called Marylee Floras and his appointment has been moved to Monday at 9 AM (8/21) with Neil Crouch.  Return in 2 weeks for follow-up.

## 2016-02-06 ENCOUNTER — Ambulatory Visit (HOSPITAL_COMMUNITY)
Admission: RE | Admit: 2016-02-06 | Discharge: 2016-02-06 | Disposition: A | Discharge status: Home / Self Care | Payer: BLUE CROSS/BLUE SHIELD | Source: Ambulatory Visit | Attending: Internal Medicine | Admitting: Internal Medicine

## 2016-02-06 ENCOUNTER — Other Ambulatory Visit (HOSPITAL_COMMUNITY): Payer: Self-pay | Admitting: Oncology

## 2016-02-06 ENCOUNTER — Encounter (HOSPITAL_BASED_OUTPATIENT_CLINIC_OR_DEPARTMENT_OTHER): Payer: BLUE CROSS/BLUE SHIELD

## 2016-02-06 ENCOUNTER — Other Ambulatory Visit: Payer: Self-pay

## 2016-02-06 ENCOUNTER — Encounter: Payer: Self-pay | Admitting: Gastroenterology

## 2016-02-06 ENCOUNTER — Encounter (HOSPITAL_COMMUNITY): Payer: Self-pay | Admitting: *Deleted

## 2016-02-06 ENCOUNTER — Ambulatory Visit (INDEPENDENT_AMBULATORY_CARE_PROVIDER_SITE_OTHER): Payer: BLUE CROSS/BLUE SHIELD | Admitting: Gastroenterology

## 2016-02-06 ENCOUNTER — Encounter (HOSPITAL_COMMUNITY)
Admission: RE | Disposition: A | Discharge status: Home / Self Care | Payer: Self-pay | Source: Ambulatory Visit | Attending: Internal Medicine

## 2016-02-06 VITALS — BP 105/74 | HR 112 | Temp 98.2°F | Resp 16

## 2016-02-06 DIAGNOSIS — C187 Malignant neoplasm of sigmoid colon: Secondary | ICD-10-CM | POA: Diagnosis not present

## 2016-02-06 DIAGNOSIS — Z79899 Other long term (current) drug therapy: Secondary | ICD-10-CM | POA: Insufficient documentation

## 2016-02-06 DIAGNOSIS — Z9049 Acquired absence of other specified parts of digestive tract: Secondary | ICD-10-CM | POA: Insufficient documentation

## 2016-02-06 DIAGNOSIS — R1310 Dysphagia, unspecified: Secondary | ICD-10-CM | POA: Insufficient documentation

## 2016-02-06 DIAGNOSIS — Z85038 Personal history of other malignant neoplasm of large intestine: Secondary | ICD-10-CM | POA: Insufficient documentation

## 2016-02-06 DIAGNOSIS — K449 Diaphragmatic hernia without obstruction or gangrene: Secondary | ICD-10-CM | POA: Insufficient documentation

## 2016-02-06 DIAGNOSIS — Z87891 Personal history of nicotine dependence: Secondary | ICD-10-CM | POA: Insufficient documentation

## 2016-02-06 DIAGNOSIS — Z5189 Encounter for other specified aftercare: Secondary | ICD-10-CM | POA: Diagnosis not present

## 2016-02-06 DIAGNOSIS — R1314 Dysphagia, pharyngoesophageal phase: Secondary | ICD-10-CM

## 2016-02-06 DIAGNOSIS — M1990 Unspecified osteoarthritis, unspecified site: Secondary | ICD-10-CM | POA: Diagnosis not present

## 2016-02-06 DIAGNOSIS — I10 Essential (primary) hypertension: Secondary | ICD-10-CM | POA: Insufficient documentation

## 2016-02-06 DIAGNOSIS — R1319 Other dysphagia: Secondary | ICD-10-CM | POA: Insufficient documentation

## 2016-02-06 DIAGNOSIS — K209 Esophagitis, unspecified: Secondary | ICD-10-CM | POA: Insufficient documentation

## 2016-02-06 HISTORY — PX: ESOPHAGEAL DILATION: SHX303

## 2016-02-06 HISTORY — PX: ESOPHAGOGASTRODUODENOSCOPY: SHX5428

## 2016-02-06 HISTORY — PX: BIOPSY: SHX5522

## 2016-02-06 SURGERY — EGD (ESOPHAGOGASTRODUODENOSCOPY)
Anesthesia: Moderate Sedation

## 2016-02-06 MED ORDER — SODIUM CHLORIDE 0.9% FLUSH
INTRAVENOUS | Status: AC
  Filled 2016-02-06: qty 10

## 2016-02-06 MED ORDER — PEGFILGRASTIM INJECTION 6 MG/0.6ML ~~LOC~~
6.0000 mg | PREFILLED_SYRINGE | Freq: Once | SUBCUTANEOUS | Status: AC
  Administered 2016-02-06: 6 mg via SUBCUTANEOUS

## 2016-02-06 MED ORDER — ONDANSETRON HCL 4 MG/2ML IJ SOLN
INTRAMUSCULAR | Status: DC | PRN
  Administered 2016-02-06: 4 mg via INTRAVENOUS

## 2016-02-06 MED ORDER — MEPERIDINE HCL 100 MG/ML IJ SOLN
INTRAMUSCULAR | Status: AC
  Filled 2016-02-06: qty 2

## 2016-02-06 MED ORDER — ONDANSETRON HCL 4 MG/2ML IJ SOLN
INTRAMUSCULAR | Status: AC
  Filled 2016-02-06: qty 2

## 2016-02-06 MED ORDER — LIDOCAINE VISCOUS 2 % MT SOLN
OROMUCOSAL | Status: AC
  Filled 2016-02-06: qty 15

## 2016-02-06 MED ORDER — MEPERIDINE HCL 100 MG/ML IJ SOLN
INTRAMUSCULAR | Status: DC | PRN
  Administered 2016-02-06: 50 mg via INTRAVENOUS
  Administered 2016-02-06: 25 mg via INTRAVENOUS
  Administered 2016-02-06: 50 mg via INTRAVENOUS

## 2016-02-06 MED ORDER — SIMETHICONE 40 MG/0.6ML PO SUSP
ORAL | Status: DC | PRN
  Administered 2016-02-06: 14:00:00

## 2016-02-06 MED ORDER — MIDAZOLAM HCL 5 MG/5ML IJ SOLN
INTRAMUSCULAR | Status: DC | PRN
  Administered 2016-02-06 (×2): 1 mg via INTRAVENOUS
  Administered 2016-02-06 (×2): 2 mg via INTRAVENOUS

## 2016-02-06 MED ORDER — LIDOCAINE VISCOUS 2 % MT SOLN
OROMUCOSAL | Status: DC | PRN
  Administered 2016-02-06: 3 mL via OROMUCOSAL

## 2016-02-06 MED ORDER — MIDAZOLAM HCL 5 MG/5ML IJ SOLN
INTRAMUSCULAR | Status: AC
  Filled 2016-02-06: qty 10

## 2016-02-06 MED ORDER — SODIUM CHLORIDE 0.9 % IV SOLN
INTRAVENOUS | Status: DC
  Administered 2016-02-06: 11:00:00 via INTRAVENOUS

## 2016-02-06 NOTE — Progress Notes (Signed)
cc'ed to pcp °

## 2016-02-06 NOTE — Patient Instructions (Signed)
1. Upper endoscopy with Dr. Gala Romney this afternoon. Please do not eat or drink anything until after the procedure.

## 2016-02-06 NOTE — Op Note (Signed)
[facility] Patient Name: Derek Rodriguez Procedure Date: 02/06/2016 1:19 PM MRN: [id number] Date of Birth: 09/12/1960 Attending MD: Norvel Richards , MD CSN: [id number] Age: 55 Admit Type: Outpatient Procedure:                Upper GI endoscopy with [facility] dilation and                            esophageal biopsy Indications:              Dysphagia Providers:                Norvel Richards, MD, Jeanann Lewandowsky. Sharon Seller, RN,                            Randa Spike, Technician Referring MD:             Ancil Linsey MD, Hall Busing. [place] Medicines:                Midazolam 6 mg IV, Meperidine 125 mg IV,                            Ondansetron 4 mg IV Complications:            No immediate complications. Estimated Blood Loss:     Estimated blood loss was minimal. Procedure:                Pre-Anesthesia Assessment:                           - Prior to the procedure, a History and Physical                            was performed, and patient medications and                            allergies were reviewed. The patient's tolerance of                            previous anesthesia was also reviewed. The risks                            and benefits of the procedure and the sedation                            options and risks were discussed with the patient.                            All questions were answered, and informed consent                            was obtained. ASA Grade Assessment: II - A patient                            with mild systemic disease. After reviewing the  risks and benefits, the patient was deemed in                            satisfactory condition to undergo the procedure.                           After obtaining informed consent, the endoscope was                            passed under direct vision. Throughout the                            procedure, the patient's blood pressure, pulse, and    oxygen saturations were monitored continuously. The                            [id number] scope was introduced through the                            mouth, and advanced to the. The [id number]                            scope was introduced through the and advanced to                            the second part of duodenum. The upper GI endoscopy                            was accomplished with ease. The patient tolerated                            the procedure well. Scope In: 2:03:49 PM Scope Out: 2:14:48 PM Total Procedure Duration: 0 hours 10 minutes 59 seconds  Findings:      LA Grade B (one or more mucosal breaks greater than 5 mm, not extending       between the tops of two mucosal folds) esophagitis was found. A       component of chemical (medication) induced injury not excluded. Tubular       esophagus appear patent throughout its course.      A small hiatal hernia was present. This was biopsied with a cold forceps       for histology      The exam was otherwise without abnormality.      The duodenal bulb and second portion of the duodenum were normal. The       scope was remove. A 56 French Maloney dilator was passed to full       insertion Inflamed appearing distal esophagus was biopsied for histology       after dilatio. Estimated blood loss was minimal. Impression:               - LA Grade B esophagitis. Component of                            chemical-induced esophagitis is not excluded.  Status post Maloney dilation and biopsy                           - Small hiatal hernia.                           - The examination was otherwise normal.                           - Normal duodenal bulb and second portion of the                            duodenum. Moderate Sedation:      Moderate (conscious) sedation was administered by the endoscopy nurse       and supervised by the endoscopist. The following parameters were       monitored:  oxygen saturation, heart rate, blood pressure, respiratory       rate, EKG, adequacy of pulmonary ventilation, and response to care.       Total physician intraservice time was 26 minutes.      Moderate (conscious) sedation was administered by the endoscopy nurse       and supervised by the endoscopist. The following parameters were       monitored: oxygen saturation, heart rate, blood pressure, respiratory       rate, EKG, adequacy of pulmonary ventilation, and response to care.       Total physician intraservice time was 26 minutes. Recommendation:           - Patient has a contact number available for                            emergencies. The signs and symptoms of potential                            delayed complications were discussed with the                            patient. Return to normal activities tomorrow.                            Written discharge instructions were provided to the                            patient.                           - Advance diet as tolerated.                           - Continue present medications including omeprazole.                           - No repeat upper endoscopy.                           - Return to GI office in 3 weeks.                           -  Use sucralfate suspension 1 gram PO QID for 7                            days. Swallowing precautions reviewed Procedure Code(s):        --- Professional ---                           [phone], Moderate sedation services provided by the                            same physician or other qualified health care                            professional performing the diagnostic or                            therapeutic service that the sedation supports,                            requiring the presence of an independent trained                            observer to assist in the monitoring of the                            patient's level of consciousness and physiological                             status; initial 15 minutes of intraservice time,                            patient age 65 years or older                           [phone], Moderate sedation services; each additional                            15 minutes intraservice time Diagnosis Code(s):        --- Professional ---                           K20.9, Esophagitis, unspecified                           K44.9, Diaphragmatic hernia without obstruction or                            gangrene                           R13.10, Dysphagia, unspecified CPT copyright 2016 American Medical Association. All rights reserved. The codes documented in this report are preliminary and upon coder review may  be revised to meet current compliance requirements. Cristopher Estimable. Idriss Quackenbush, MD Norvel Richards, MD 02/06/2016 2:53:59 PM This report has been signed electronically. Number of Addenda: 0

## 2016-02-06 NOTE — H&P (View-Only) (Signed)
Primary Care Physician:  [name], MD  Primary Gastroenterologist:  Garfield Cornea, MD   Chief Complaint  Patient presents with  . Dysphagia    has colon cancer, trouble swallowing everything x 1 month    HPI:  Derek Rodriguez is a 55 y.o. male here for further evaluation of dysphagia. 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 Status post partial colectomy by Dr. Arnoldo Morale April 2017. Now on FOLFOX adjuvant therapy beginning on 11/25/2015 with Oxaliplatin reduced by 25% after intolerance to Xelox on 10/25/2015. HE IS STILL IN NEED OF COMPLETE COLONOSCOPY THIS FALL.  One month history of difficulty swallowing. No Odynophagia. Denies heartburn. Only able to drink water at this point. Cannot "stomach" Ensure/boost. Complains of nausea. Has been trying mashed potatoes, other soft foods but food will not go down and comes back up. No melena or rectal bleeding. Bowel movements regular. Denies abdominal pain. Down 50 pounds since March, 30 pounds in the past one month. Rarely takes pain medication.  Soft tissue neck x-ray unremarkable last week.  Current Outpatient Prescriptions  Medication Sig Dispense Refill  . escitalopram (LEXAPRO) 20 MG tablet For the first 3 days take 1/2 tablet. Then take 1 tablet daily thereafter. (Patient taking differently: Take 20 mg by mouth daily. ) 30 tablet 1  . granisetron (SANCUSO) 3.1 MG/24HR Apply to skin starting 24 hours before chemotherapy. Remove after 7 days. 1 each 5  . Heparin Lock Flush (HEPARIN FLUSH, PORCINE,) 100 UNIT/ML injection Flush port with 5cc of heparin lock flush. 10 Syringe 0  . ibuprofen (ADVIL,MOTRIN) 200 MG tablet Take 400 mg by mouth every 6 (six) hours as needed for mild pain or moderate pain.    Marland Kitchen lidocaine-prilocaine (EMLA) cream Apply a quarter size amount to port site 1 hour prior to chemo. Do not rub in. Cover with plastic wrap. 30 g 3  . lisinopril (PRINIVIL,ZESTRIL) 10 MG tablet Take 10 mg by mouth daily.    Marland Kitchen  LORazepam (ATIVAN) 0.5 MG tablet Take 1 tablet (0.5 mg total) by mouth every 6 (six) hours as needed for anxiety. 120 tablet 0  . omeprazole (PRILOSEC) 40 MG capsule Take 1 capsule (40 mg total) by mouth daily. 30 capsule 6  . ondansetron (ZOFRAN) 8 MG tablet Take 1 tablet (8 mg total) by mouth every 8 (eight) hours as needed for nausea or vomiting. 30 tablet 2  . OXALIPLATIN IV Inject into the vein. (chemo) To be given once every 14 days    . oxyCODONE-acetaminophen (PERCOCET) 7.5-325 MG tablet Take 1-2 tablets by mouth every 4 (four) hours as needed. (Patient taking differently: Take 1-2 tablets by mouth every 4 (four) hours as needed for moderate pain. ) 30 tablet 0  . Probiotic Product ([place]) CAPS Take 1 capsule by mouth daily. Reported on 11/30/2015    . prochlorperazine (COMPAZINE) 10 MG tablet Take 1 tablet (10 mg total) by mouth every 6 (six) hours as needed for nausea or vomiting. 90 tablet 6  . promethazine (PHENERGAN) 25 MG suppository Place 1 suppository (25 mg total) rectally every 6 (six) hours as needed for nausea or vomiting. 6 each 0  . Sodium Chloride Flush (NORMAL SALINE FLUSH) 0.9 % SOLN Flush port with 10 ml of normal saline. 10 Syringe 0  . zolpidem (AMBIEN) 10 MG tablet Take 1 tablet (10 mg total) by mouth at bedtime as needed for sleep. 30 tablet 3   No current facility-administered medications for this visit.  Allergies as of 02/06/2016  . (No Known Allergies)    Past Medical History:  Diagnosis Date  . Adenocarcinoma of sigmoid colon ([place]) 10/18/2015  . Arthritis   . Diverticulitis   . Hypertension     Past Surgical History:  Procedure Laterality Date  . PARTIAL COLECTOMY N/A 10/03/2015   Procedure: PARTIAL COLECTOMY;  Surgeon: Aviva Signs, MD;  Location: [facility];  Service: General;  Laterality: N/A;  . PORTACATH PLACEMENT N/A 11/18/2015   Procedure: INSERTION PORT-A-CATH LEFT SUBCLAVIAN;  Surgeon: Aviva Signs, MD;  Location: [facility];  Service:  General;  Laterality: N/A;    Family History  Problem Relation Age of Onset  . Diabetes Father     Social History   Social History  . Marital status: Married    Spouse name: N/A  . Number of children: N/A  . Years of education: N/A   Occupational History  . Not on file.   Social History Main Topics  . Smoking status: Former Smoker    Types: 21, Pipe    Quit date: 09/26/1985  . Smokeless tobacco: Former Systems developer    Types: Snuff    Quit date: 06/29/2015  . Alcohol use No     Comment: occ  . Drug use: No  . Sexual activity: Yes    Birth control/ protection: None   Other Topics Concern  . Not on file   Social History Narrative  . No narrative on file      ROS:  General: Positive anorexia, weight loss, fatigue. See history of present illness. No fever. Eyes: Negative for vision changes.  ENT: Negative for hoarseness, nasal congestion.See history of present illness CV: Negative for chest pain, angina, palpitations, dyspnea on exertion, peripheral edema.  Respiratory: Negative for dyspnea at rest, dyspnea on exertion, cough, sputum, wheezing.  GI: See history of present illness. GU:  Negative for dysuria, hematuria, urinary incontinence, urinary frequency, nocturnal urination.  MS: Negative for joint pain, low back pain.  Derm: Negative for rash or itching.  Neuro: Negative for weakness, abnormal sensation, seizure, frequent headaches, memory loss, confusion.  Psych: Negative for anxiety, depression, suicidal ideation, hallucinations.  Endo: See history of present illness Heme: Negative for bruising or bleeding. Allergy: Negative for rash or hives.    Physical Examination:  BP 119/86   Pulse (!) 107   Temp 97.9 F (36.6 C) (Oral)   Ht 6' (1.829 m)   Wt 203 lb (92.1 kg)   BMI 27.53 kg/m    General: Well-nourished, well-developed in no acute distress. Accompanied by wife Head: Normocephalic, atraumatic.   Eyes: Conjunctiva pink, no icterus. Mouth:  Oropharyngeal mucosa moist and pink , no lesions erythema or exudate. Neck: Supple without thyromegaly, masses, or lymphadenopathy.  Lungs: Clear to auscultation bilaterally.  Heart: Regular rate and rhythm, no murmurs rubs or gallops.  Abdomen: Bowel sounds are normal, nontender, nondistended, no hepatosplenomegaly or masses, no abdominal bruits or    hernia , no rebound or guarding.   Rectal: Not performed Extremities: No lower extremity edema. No clubbing or deformities.  Neuro: Alert and oriented x 4 , grossly normal neurologically.  Skin: Warm and dry, no rash or jaundice.   Psych: Alert and cooperative, normal mood and affect.  Labs: Lab Results  Component Value Date   CREATININE 1.28 (H) 02/03/2016   BUN 18 02/03/2016   NA 135 02/03/2016   K 3.7 02/03/2016   CL 101 02/03/2016   CO2 19 (L) 02/03/2016   Lab Results  Component Value Date   ALT 21 02/03/2016   AST 34 02/03/2016   ALKPHOS 86 02/03/2016   BILITOT 1.2 02/03/2016   Lab Results  Component Value Date   WBC 6.7 02/03/2016   HGB 16.1 02/03/2016   HCT 45.4 02/03/2016   MCV 81.4 02/03/2016   PLT 278 02/03/2016     Imaging Studies: Dg Neck Soft Tissue  Result Date: 01/30/2016 CLINICAL DATA:  New cough.  Colon carcinoma. EXAM: NECK SOFT TISSUES - 1+ VIEW COMPARISON:  None. FINDINGS: Frontal and lateral views were obtained. The epiglottis and aryepiglottic folds appear normal. Prevertebral soft tissues are normal. No air-fluid level to suggest abscess. There is extensive degenerative change in the mid cervical spine. Tongue base region appears normal. Visualized tracheal air column appears normal. IMPRESSION: Multilevel degenerative change in the cervical spine. No soft tissue lesion. Epiglottis and aryepiglottic folds appear normal. Visualized tracheal air column appears normal. Electronically Signed   By: Lowella Grip III M.D.   On: 01/30/2016 17:37   Dg Chest 2 View  Result Date: 01/30/2016 CLINICAL DATA:   Nonproductive cough and shortness of breath, several weeks duration. Symptoms worsening today. EXAM: CHEST  2 VIEW COMPARISON:  11/18/2015 FINDINGS: Heart size is normal. Mediastinal shadows are normal. Power port inserted from a left subclavian approach has its tip in the SVC at the azygos level. The lungs are clear. The vascularity is normal. No effusions. No bone abnormality. IMPRESSION: No active disease. Electronically Signed   By: Nelson Chimes M.D.   On: 01/30/2016 16:07

## 2016-02-06 NOTE — Assessment & Plan Note (Signed)
55 year old gentleman currently undergoing chemotherapy for stage IIIC adenocarcinoma of the sigmoid colon. He complains of one-month history of severe esophageal dysphagia, unable to tolerate any foods. Drinking water only at this time. Able to take his medications. Denies odynophagia. Differential diagnosis includes esophagitis related to the yeast or viral, reflux esophagitis, less likely malignancy or esophageal motility disorder. Given significant weight loss, patient's inability to maintain nutrition/hydration we have planned for EGD plus or minus EGD this afternoon.  I have discussed the risks, alternatives, benefits with regards to but not limited to the risk of reaction to medication, bleeding, infection, perforation and the patient is agreeable to proceed. Written consent to be obtained.

## 2016-02-06 NOTE — Discharge Instructions (Addendum)
GERD information provided  Swallowing precautions reviewed  Continue omeprazole 40 mg daily  Carafate suspension 1 g 4 times daily 7 days  Further recommendations to follow pending review of pathology report  Office visit with [place] in 3 weeks  Friday, September 15 at 8:00AM        EGD Discharge instructions Please read the instructions outlined below and refer to this sheet in the next few weeks. These discharge instructions provide you with general information on caring for yourself after you leave the hospital. Your doctor may also give you specific instructions. While your treatment has been planned according to the most current medical practices available, unavoidable complications occasionally occur. If you have any problems or questions after discharge, please call your doctor. ACTIVITY  You may resume your regular activity but move at a slower pace for the next 24 hours.   Take frequent rest periods for the next 24 hours.   Walking will help expel (get rid of) the air and reduce the bloated feeling in your abdomen.   No driving for 24 hours (because of the anesthesia (medicine) used during the test).   You may shower.   Do not sign any important legal documents or operate any machinery for 24 hours (because of the anesthesia used during the test).  NUTRITION  Drink plenty of fluids.   You may resume your normal diet.   Begin with a light meal and progress to your normal diet.   Avoid alcoholic beverages for 24 hours or as instructed by your caregiver.  MEDICATIONS  You may resume your normal medications unless your caregiver tells you otherwise.  WHAT YOU CAN EXPECT TODAY  You may experience abdominal discomfort such as a feeling of fullness or gas pains.  FOLLOW-UP  Your doctor will discuss the results of your test with you.  SEEK IMMEDIATE MEDICAL ATTENTION IF ANY OF THE FOLLOWING OCCUR:  Excessive nausea (feeling sick to your stomach) and/or vomiting.     Severe abdominal pain and distention (swelling).   Trouble swallowing.   Temperature over 101 F (37.8 C).   Rectal bleeding or vomiting of blood.    Gastroesophageal Reflux Disease, Adult Normally, food travels down the esophagus and stays in the stomach to be digested. However, when a person has gastroesophageal reflux disease (GERD), food and stomach acid move back up into the esophagus. When this happens, the esophagus becomes sore and inflamed. Over time, GERD can create small holes (ulcers) in the lining of the esophagus.  CAUSES This condition is caused by a problem with the muscle between the esophagus and the stomach (lower esophageal sphincter, or LES). Normally, the LES muscle closes after food passes through the esophagus to the stomach. When the LES is weakened or abnormal, it does not close properly, and that allows food and stomach acid to go back up into the esophagus. The LES can be weakened by certain dietary substances, medicines, and medical conditions, including:  Tobacco use.  Pregnancy.  Having a hiatal hernia.  Heavy alcohol use.  Certain foods and beverages, such as coffee, chocolate, onions, and peppermint. RISK FACTORS This condition is more likely to develop in:  People who have an increased body weight.  People who have connective tissue disorders.  People who use NSAID medicines. SYMPTOMS Symptoms of this condition include:  Heartburn.  Difficult or painful swallowing.  The feeling of having a lump in the throat.  Abitter taste in the mouth.  Bad breath.  Having a large amount  of saliva.  Having an upset or bloated stomach.  Belching.  Chest pain.  Shortness of breath or wheezing.  Ongoing (chronic) cough or a night-time cough.  Wearing away of tooth enamel.  Weight loss. Different conditions can cause chest pain. Make sure to see your health care provider if you experience chest pain. DIAGNOSIS Your health care provider  will take a medical history and perform a physical exam. To determine if you have mild or severe GERD, your health care provider may also monitor how you respond to treatment. You may also have other tests, including:  An endoscopy toexamine your stomach and esophagus with a small camera.  A test thatmeasures the acidity level in your esophagus.  A test thatmeasures how much pressure is on your esophagus.  A barium swallow or modified barium swallow to show the shape, size, and functioning of your esophagus. TREATMENT The goal of treatment is to help relieve your symptoms and to prevent complications. Treatment for this condition may vary depending on how severe your symptoms are. Your health care provider may recommend:  Changes to your diet.  Medicine.  Surgery. HOME CARE INSTRUCTIONS Diet  Follow a diet as recommended by your health care provider. This may involve avoiding foods and drinks such as:  Coffee and tea (with or without caffeine).  Drinks that containalcohol.  Energy drinks and sports drinks.  Carbonated drinks or sodas.  Chocolate and cocoa.  Peppermint and mint flavorings.  Garlic and onions.  Horseradish.  Spicy and acidic foods, including peppers, chili powder, curry powder, vinegar, hot sauces, and barbecue sauce.  Citrus fruit juices and citrus fruits, such as oranges, lemons, and limes.  Tomato-based foods, such as red sauce, chili, salsa, and pizza with red sauce.  Fried and fatty foods, such as donuts, french fries, potato chips, and high-fat dressings.  High-fat meats, such as hot dogs and fatty cuts of red and white meats, such as rib eye steak, sausage, ham, and bacon.  High-fat dairy items, such as whole milk, butter, and cream cheese.  Eat small, frequent meals instead of large meals.  Avoid drinking large amounts of liquid with your meals.  Avoid eating meals during the 2-3 hours before bedtime.  Avoid lying down right after you  eat.  Do not exercise right after you eat. General Instructions  Pay attention to any changes in your symptoms.  Take over-the-counter and prescription medicines only as told by your health care provider. Do not take aspirin, ibuprofen, or other NSAIDs unless your health care provider told you to do so.  Do not use any tobacco products, including cigarettes, chewing tobacco, and e-cigarettes. If you need help quitting, ask your health care provider.  Wear loose-fitting clothing. Do not wear anything tight around your waist that causes pressure on your abdomen.  Raise (elevate) the head of your bed 6 inches (15cm).  Try to reduce your stress, such as with yoga or meditation. If you need help reducing stress, ask your health care provider.  If you are overweight, reduce your weight to an amount that is healthy for you. Ask your health care provider for guidance about a safe weight loss goal.  Keep all follow-up visits as told by your health care provider. This is important. SEEK MEDICAL CARE IF:  You have new symptoms.  You have unexplained weight loss.  You have difficulty swallowing, or it hurts to swallow.  You have wheezing or a persistent cough.  Your symptoms do not improve with treatment.  You have a hoarse voice. SEEK IMMEDIATE MEDICAL CARE IF:  You have pain in your arms, neck, jaw, teeth, or back.  You feel sweaty, dizzy, or light-headed.  You have chest pain or shortness of breath.  You vomit and your vomit looks like blood or coffee grounds.  You faint.  Your stool is bloody or black.  You cannot swallow, drink, or eat.   This information is not intended to replace advice given to you by your health care provider. Make sure you discuss any questions you have with your health care provider.

## 2016-02-06 NOTE — Interval H&P Note (Signed)
History and Physical Interval Note:  02/06/2016 1:45 PM  Derek Rodriguez  has presented today for surgery, with the diagnosis of dysphagia  The various methods of treatment have been discussed with the patient and family. After consideration of risks, benefits and other options for treatment, the patient has consented to  Procedure(s) with comments: ESOPHAGOGASTRODUODENOSCOPY (EGD) (N/A) - 115 as a surgical intervention .  The patient's history has been reviewed, patient examined, no change in status, stable for surgery.  I have reviewed the patient's chart and labs.  Questions were answered to the patient's satisfaction.    Pt seen and examined in endoscopy; no change - EGD w ED as feasible/approriate today.  The risks, benefits, limitations, alternatives and imponderables have been reviewed with the patient. Potential for esophageal dilation, biopsy, etc. have also been reviewed.  Questions have been answered. All parties agreeable.  Manus Rudd

## 2016-02-06 NOTE — Progress Notes (Signed)
Primary Care Physician:  [name], MD  Primary Gastroenterologist:  Garfield Cornea, MD   Chief Complaint  Patient presents with  . Dysphagia    has colon cancer, trouble swallowing everything x 1 month    HPI:  Derek Rodriguez is a 55 y.o. male here for further evaluation of dysphagia. 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 Status post partial colectomy by Dr. Arnoldo Morale April 2017. Now on FOLFOX adjuvant therapy beginning on 11/25/2015 with Oxaliplatin reduced by 25% after intolerance to Xelox on 10/25/2015. HE IS STILL IN NEED OF COMPLETE COLONOSCOPY THIS FALL.  One month history of difficulty swallowing. No Odynophagia. Denies heartburn. Only able to drink water at this point. Cannot "stomach" Ensure/boost. Complains of nausea. Has been trying mashed potatoes, other soft foods but food will not go down and comes back up. No melena or rectal bleeding. Bowel movements regular. Denies abdominal pain. Down 50 pounds since March, 30 pounds in the past one month. Rarely takes pain medication.  Soft tissue neck x-ray unremarkable last week.  Current Outpatient Prescriptions  Medication Sig Dispense Refill  . escitalopram (LEXAPRO) 20 MG tablet For the first 3 days take 1/2 tablet. Then take 1 tablet daily thereafter. (Patient taking differently: Take 20 mg by mouth daily. ) 30 tablet 1  . granisetron (SANCUSO) 3.1 MG/24HR Apply to skin starting 24 hours before chemotherapy. Remove after 7 days. 1 each 5  . Heparin Lock Flush (HEPARIN FLUSH, PORCINE,) 100 UNIT/ML injection Flush port with 5cc of heparin lock flush. 10 Syringe 0  . ibuprofen (ADVIL,MOTRIN) 200 MG tablet Take 400 mg by mouth every 6 (six) hours as needed for mild pain or moderate pain.    Marland Kitchen lidocaine-prilocaine (EMLA) cream Apply a quarter size amount to port site 1 hour prior to chemo. Do not rub in. Cover with plastic wrap. 30 g 3  . lisinopril (PRINIVIL,ZESTRIL) 10 MG tablet Take 10 mg by mouth daily.    Marland Kitchen  LORazepam (ATIVAN) 0.5 MG tablet Take 1 tablet (0.5 mg total) by mouth every 6 (six) hours as needed for anxiety. 120 tablet 0  . omeprazole (PRILOSEC) 40 MG capsule Take 1 capsule (40 mg total) by mouth daily. 30 capsule 6  . ondansetron (ZOFRAN) 8 MG tablet Take 1 tablet (8 mg total) by mouth every 8 (eight) hours as needed for nausea or vomiting. 30 tablet 2  . OXALIPLATIN IV Inject into the vein. (chemo) To be given once every 14 days    . oxyCODONE-acetaminophen (PERCOCET) 7.5-325 MG tablet Take 1-2 tablets by mouth every 4 (four) hours as needed. (Patient taking differently: Take 1-2 tablets by mouth every 4 (four) hours as needed for moderate pain. ) 30 tablet 0  . Probiotic Product ([place]) CAPS Take 1 capsule by mouth daily. Reported on 11/30/2015    . prochlorperazine (COMPAZINE) 10 MG tablet Take 1 tablet (10 mg total) by mouth every 6 (six) hours as needed for nausea or vomiting. 90 tablet 6  . promethazine (PHENERGAN) 25 MG suppository Place 1 suppository (25 mg total) rectally every 6 (six) hours as needed for nausea or vomiting. 6 each 0  . Sodium Chloride Flush (NORMAL SALINE FLUSH) 0.9 % SOLN Flush port with 10 ml of normal saline. 10 Syringe 0  . zolpidem (AMBIEN) 10 MG tablet Take 1 tablet (10 mg total) by mouth at bedtime as needed for sleep. 30 tablet 3   No current facility-administered medications for this visit.  Allergies as of 02/06/2016  . (No Known Allergies)    Past Medical History:  Diagnosis Date  . Adenocarcinoma of sigmoid colon ([place]) 10/18/2015  . Arthritis   . Diverticulitis   . Hypertension     Past Surgical History:  Procedure Laterality Date  . PARTIAL COLECTOMY N/A 10/03/2015   Procedure: PARTIAL COLECTOMY;  Surgeon: Aviva Signs, MD;  Location: [facility];  Service: General;  Laterality: N/A;  . PORTACATH PLACEMENT N/A 11/18/2015   Procedure: INSERTION PORT-A-CATH LEFT SUBCLAVIAN;  Surgeon: Aviva Signs, MD;  Location: [facility];  Service:  General;  Laterality: N/A;    Family History  Problem Relation Age of Onset  . Diabetes Father     Social History   Social History  . Marital status: Married    Spouse name: N/A  . Number of children: N/A  . Years of education: N/A   Occupational History  . Not on file.   Social History Main Topics  . Smoking status: Former Smoker    Types: 40, Pipe    Quit date: 09/26/1985  . Smokeless tobacco: Former Systems developer    Types: Snuff    Quit date: 06/29/2015  . Alcohol use No     Comment: occ  . Drug use: No  . Sexual activity: Yes    Birth control/ protection: None   Other Topics Concern  . Not on file   Social History Narrative  . No narrative on file      ROS:  General: Positive anorexia, weight loss, fatigue. See history of present illness. No fever. Eyes: Negative for vision changes.  ENT: Negative for hoarseness, nasal congestion.See history of present illness CV: Negative for chest pain, angina, palpitations, dyspnea on exertion, peripheral edema.  Respiratory: Negative for dyspnea at rest, dyspnea on exertion, cough, sputum, wheezing.  GI: See history of present illness. GU:  Negative for dysuria, hematuria, urinary incontinence, urinary frequency, nocturnal urination.  MS: Negative for joint pain, low back pain.  Derm: Negative for rash or itching.  Neuro: Negative for weakness, abnormal sensation, seizure, frequent headaches, memory loss, confusion.  Psych: Negative for anxiety, depression, suicidal ideation, hallucinations.  Endo: See history of present illness Heme: Negative for bruising or bleeding. Allergy: Negative for rash or hives.    Physical Examination:  BP 119/86   Pulse (!) 107   Temp 97.9 F (36.6 C) (Oral)   Ht 6' (1.829 m)   Wt 203 lb (92.1 kg)   BMI 27.53 kg/m    General: Well-nourished, well-developed in no acute distress. Accompanied by wife Head: Normocephalic, atraumatic.   Eyes: Conjunctiva pink, no icterus. Mouth:  Oropharyngeal mucosa moist and pink , no lesions erythema or exudate. Neck: Supple without thyromegaly, masses, or lymphadenopathy.  Lungs: Clear to auscultation bilaterally.  Heart: Regular rate and rhythm, no murmurs rubs or gallops.  Abdomen: Bowel sounds are normal, nontender, nondistended, no hepatosplenomegaly or masses, no abdominal bruits or    hernia , no rebound or guarding.   Rectal: Not performed Extremities: No lower extremity edema. No clubbing or deformities.  Neuro: Alert and oriented x 4 , grossly normal neurologically.  Skin: Warm and dry, no rash or jaundice.   Psych: Alert and cooperative, normal mood and affect.  Labs: Lab Results  Component Value Date   CREATININE 1.28 (H) 02/03/2016   BUN 18 02/03/2016   NA 135 02/03/2016   K 3.7 02/03/2016   CL 101 02/03/2016   CO2 19 (L) 02/03/2016   Lab Results  Component Value Date   ALT 21 02/03/2016   AST 34 02/03/2016   ALKPHOS 86 02/03/2016   BILITOT 1.2 02/03/2016   Lab Results  Component Value Date   WBC 6.7 02/03/2016   HGB 16.1 02/03/2016   HCT 45.4 02/03/2016   MCV 81.4 02/03/2016   PLT 278 02/03/2016     Imaging Studies: Dg Neck Soft Tissue  Result Date: 01/30/2016 CLINICAL DATA:  New cough.  Colon carcinoma. EXAM: NECK SOFT TISSUES - 1+ VIEW COMPARISON:  None. FINDINGS: Frontal and lateral views were obtained. The epiglottis and aryepiglottic folds appear normal. Prevertebral soft tissues are normal. No air-fluid level to suggest abscess. There is extensive degenerative change in the mid cervical spine. Tongue base region appears normal. Visualized tracheal air column appears normal. IMPRESSION: Multilevel degenerative change in the cervical spine. No soft tissue lesion. Epiglottis and aryepiglottic folds appear normal. Visualized tracheal air column appears normal. Electronically Signed   By: Lowella Grip III M.D.   On: 01/30/2016 17:37   Dg Chest 2 View  Result Date: 01/30/2016 CLINICAL DATA:   Nonproductive cough and shortness of breath, several weeks duration. Symptoms worsening today. EXAM: CHEST  2 VIEW COMPARISON:  11/18/2015 FINDINGS: Heart size is normal. Mediastinal shadows are normal. Power port inserted from a left subclavian approach has its tip in the SVC at the azygos level. The lungs are clear. The vascularity is normal. No effusions. No bone abnormality. IMPRESSION: No active disease. Electronically Signed   By: Nelson Chimes M.D.   On: 01/30/2016 16:07

## 2016-02-06 NOTE — Progress Notes (Signed)
Patient brought in pump with chemo attached.  Protocol used to disconnect chemo from pump. Port was disconnected at home. Needle disposed of properly.    Derek Rodriguez presents today for injection per MD orders. Neulasta 6mg  administered SQ in right Abdomen. Administration without incident. Patient tolerated well.

## 2016-02-08 ENCOUNTER — Encounter: Payer: Self-pay | Admitting: Internal Medicine

## 2016-02-09 ENCOUNTER — Encounter (HOSPITAL_COMMUNITY): Payer: Self-pay | Admitting: Internal Medicine

## 2016-02-10 ENCOUNTER — Ambulatory Visit (HOSPITAL_COMMUNITY): Payer: BLUE CROSS/BLUE SHIELD

## 2016-02-10 ENCOUNTER — Ambulatory Visit (HOSPITAL_COMMUNITY): Payer: BLUE CROSS/BLUE SHIELD | Admitting: Oncology

## 2016-02-10 ENCOUNTER — Inpatient Hospital Stay (HOSPITAL_COMMUNITY): Payer: BLUE CROSS/BLUE SHIELD

## 2016-02-17 ENCOUNTER — Encounter: Payer: Self-pay | Admitting: Dietician

## 2016-02-17 ENCOUNTER — Inpatient Hospital Stay (HOSPITAL_COMMUNITY): Payer: BLUE CROSS/BLUE SHIELD

## 2016-02-17 ENCOUNTER — Ambulatory Visit (HOSPITAL_COMMUNITY): Payer: BLUE CROSS/BLUE SHIELD | Admitting: Oncology

## 2016-02-17 ENCOUNTER — Ambulatory Visit (HOSPITAL_COMMUNITY): Payer: BLUE CROSS/BLUE SHIELD

## 2016-02-17 ENCOUNTER — Encounter (HOSPITAL_COMMUNITY): Payer: BLUE CROSS/BLUE SHIELD | Attending: Oncology

## 2016-02-17 ENCOUNTER — Ambulatory Visit (HOSPITAL_COMMUNITY): Payer: BLUE CROSS/BLUE SHIELD | Admitting: Hematology & Oncology

## 2016-02-17 VITALS — BP 111/72 | HR 75 | Temp 98.0°F | Resp 16 | Wt 195.4 lb

## 2016-02-17 DIAGNOSIS — Z5111 Encounter for antineoplastic chemotherapy: Secondary | ICD-10-CM

## 2016-02-17 DIAGNOSIS — Z9889 Other specified postprocedural states: Secondary | ICD-10-CM | POA: Insufficient documentation

## 2016-02-17 DIAGNOSIS — D649 Anemia, unspecified: Secondary | ICD-10-CM | POA: Diagnosis not present

## 2016-02-17 DIAGNOSIS — C187 Malignant neoplasm of sigmoid colon: Secondary | ICD-10-CM | POA: Insufficient documentation

## 2016-02-17 DIAGNOSIS — I10 Essential (primary) hypertension: Secondary | ICD-10-CM | POA: Insufficient documentation

## 2016-02-17 DIAGNOSIS — E876 Hypokalemia: Secondary | ICD-10-CM | POA: Diagnosis not present

## 2016-02-17 DIAGNOSIS — M1990 Unspecified osteoarthritis, unspecified site: Secondary | ICD-10-CM | POA: Diagnosis not present

## 2016-02-17 DIAGNOSIS — Z833 Family history of diabetes mellitus: Secondary | ICD-10-CM | POA: Diagnosis not present

## 2016-02-17 DIAGNOSIS — R5382 Chronic fatigue, unspecified: Secondary | ICD-10-CM

## 2016-02-17 DIAGNOSIS — Z87891 Personal history of nicotine dependence: Secondary | ICD-10-CM | POA: Diagnosis not present

## 2016-02-17 DIAGNOSIS — Z79899 Other long term (current) drug therapy: Secondary | ICD-10-CM | POA: Diagnosis not present

## 2016-02-17 DIAGNOSIS — K5792 Diverticulitis of intestine, part unspecified, without perforation or abscess without bleeding: Secondary | ICD-10-CM | POA: Diagnosis not present

## 2016-02-17 DIAGNOSIS — R5383 Other fatigue: Secondary | ICD-10-CM | POA: Insufficient documentation

## 2016-02-17 LAB — CBC WITH DIFFERENTIAL/PLATELET
BASOS ABS: 0 10*3/uL (ref 0.0–0.1)
BASOS PCT: 0 %
EOS ABS: 0.1 10*3/uL (ref 0.0–0.7)
Eosinophils Relative: 1 %
HCT: 46.9 % (ref 39.0–52.0)
HEMOGLOBIN: 16.5 g/dL (ref 13.0–17.0)
Lymphocytes Relative: 17 %
Lymphs Abs: 1.8 10*3/uL (ref 0.7–4.0)
MCH: 29.1 pg (ref 26.0–34.0)
MCHC: 35.2 g/dL (ref 30.0–36.0)
MCV: 82.7 fL (ref 78.0–100.0)
Monocytes Absolute: 1.3 10*3/uL — ABNORMAL HIGH (ref 0.1–1.0)
Monocytes Relative: 12 %
NEUTROS ABS: 7.5 10*3/uL (ref 1.7–7.7)
NEUTROS PCT: 70 %
Platelets: 220 10*3/uL (ref 150–400)
RBC: 5.67 MIL/uL (ref 4.22–5.81)
RDW: 17.4 % — ABNORMAL HIGH (ref 11.5–15.5)
WBC: 10.6 10*3/uL — AB (ref 4.0–10.5)

## 2016-02-17 LAB — COMPREHENSIVE METABOLIC PANEL
ALBUMIN: 4.1 g/dL (ref 3.5–5.0)
ALK PHOS: 133 U/L — AB (ref 38–126)
ALT: 69 U/L — ABNORMAL HIGH (ref 17–63)
AST: 67 U/L — AB (ref 15–41)
Anion gap: 14 (ref 5–15)
BILIRUBIN TOTAL: 1.1 mg/dL (ref 0.3–1.2)
BUN: 17 mg/dL (ref 6–20)
CALCIUM: 9.6 mg/dL (ref 8.9–10.3)
CO2: 25 mmol/L (ref 22–32)
Chloride: 96 mmol/L — ABNORMAL LOW (ref 101–111)
Creatinine, Ser: 1.28 mg/dL — ABNORMAL HIGH (ref 0.61–1.24)
GFR calc Af Amer: >60 mL/min (ref >60)
GFR calc non Af Amer: >60 mL/min (ref >60)
GLUCOSE: 161 mg/dL — AB (ref 65–99)
POTASSIUM: 2.9 mmol/L — AB (ref 3.5–5.1)
Sodium: 135 mmol/L (ref 135–145)
TOTAL PROTEIN: 7.4 g/dL (ref 6.5–8.1)

## 2016-02-17 MED ORDER — HEPARIN SOD (PORK) LOCK FLUSH 100 UNIT/ML IV SOLN: 500.0000 [IU] | Freq: Once | INTRAVENOUS | Status: DC | PRN

## 2016-02-17 MED ORDER — PALONOSETRON HCL INJECTION 0.25 MG/5ML
INTRAVENOUS | Status: AC
  Filled 2016-02-17: qty 5

## 2016-02-17 MED ORDER — POTASSIUM CHLORIDE 10 MEQ/100ML IV SOLN
10.0000 meq | INTRAVENOUS | Status: AC
  Administered 2016-02-17 (×4): 10 meq via INTRAVENOUS
  Filled 2016-02-17 (×4): qty 100

## 2016-02-17 MED ORDER — DEXTROSE 5 % IV SOLN
Freq: Once | INTRAVENOUS | Status: AC
  Administered 2016-02-17: 15:00:00 via INTRAVENOUS

## 2016-02-17 MED ORDER — OXYCODONE-ACETAMINOPHEN 7.5-325 MG PO TABS: 1.0000 | ORAL_TABLET | ORAL | 0 refills | Status: DC | PRN

## 2016-02-17 MED ORDER — SODIUM CHLORIDE 0.9 % IV SOLN
2400.0000 mg/m2 | INTRAVENOUS | Status: DC
  Administered 2016-02-17: 5500 mg via INTRAVENOUS
  Filled 2016-02-17: qty 100

## 2016-02-17 MED ORDER — LEUCOVORIN CALCIUM INJECTION 350 MG
400.0000 mg/m2 | Freq: Once | INTRAMUSCULAR | Status: AC
  Administered 2016-02-17: 920 mg via INTRAVENOUS
  Filled 2016-02-17: qty 46

## 2016-02-17 MED ORDER — SODIUM CHLORIDE 0.9 % IV SOLN
Freq: Once | INTRAVENOUS | Status: AC
  Administered 2016-02-17: 15:00:00 via INTRAVENOUS
  Filled 2016-02-17: qty 5

## 2016-02-17 MED ORDER — OXALIPLATIN CHEMO INJECTION 100 MG/20ML
68.0000 mg/m2 | Freq: Once | INTRAVENOUS | Status: AC
  Administered 2016-02-17: 155 mg via INTRAVENOUS
  Filled 2016-02-17: qty 31

## 2016-02-17 MED ORDER — POTASSIUM CHLORIDE CRYS ER 20 MEQ PO TBCR: 40.0000 meq | EXTENDED_RELEASE_TABLET | Freq: Two times a day (BID) | ORAL | 0 refills | Status: DC

## 2016-02-17 MED ORDER — LORAZEPAM 2 MG/ML IJ SOLN
INTRAMUSCULAR | Status: AC
  Filled 2016-02-17: qty 1

## 2016-02-17 MED ORDER — LORAZEPAM 2 MG/ML IJ SOLN
0.5000 mg | Freq: Once | INTRAMUSCULAR | Status: AC
  Administered 2016-02-17: 0.5 mg via INTRAVENOUS

## 2016-02-17 MED ORDER — LORAZEPAM 0.5 MG PO TABS: 0.5000 mg | ORAL_TABLET | Freq: Four times a day (QID) | ORAL | 0 refills | Status: DC | PRN

## 2016-02-17 MED ORDER — PALONOSETRON HCL INJECTION 0.25 MG/5ML
0.2500 mg | Freq: Once | INTRAVENOUS | Status: AC
  Administered 2016-02-17: 0.25 mg via INTRAVENOUS

## 2016-02-17 MED ORDER — SODIUM CHLORIDE 0.9% FLUSH: 10.0000 mL | INTRAVENOUS | Status: DC | PRN

## 2016-02-17 NOTE — Progress Notes (Signed)
Patient potassium level low, MD aware and IV potassium ordered as well as an at home prescription.  Patient and wife aware.  Patient's wife reports that patient is still not eating and weight is down another 8 pounds since last weight, MD aware.  GI consult complete, patient refuses to take prescribed carafate because he had an episode of emesis after, likely related to treatment.  Patient encouraged to try to take the carafate again for the irritation and inflammation in his throat per GI.   Patient wheeled out of department in wheelchair, stable and VSS.  Tolerated infusion well.

## 2016-02-17 NOTE — Progress Notes (Signed)
Patient's weight loss was made known to RD by pharmacist today.   MD and RNs state this pt has been very difficult to work with and MD feels much of issue is anxiety related.   Contacted Pt by visiting during infusion.    Wt Readings from Last 10 Encounters:  02/17/16 195 lb 6.4 oz (88.6 kg)  02/06/16 203 lb (92.1 kg)  02/06/16 203 lb (92.1 kg)  02/03/16 205 lb 9.6 oz (93.3 kg)  01/30/16 212 lb (96.2 kg)  01/27/16 215 lb (97.5 kg)  01/13/16 227 lb 6.4 oz (103.1 kg)  01/06/16 255 lb (115.7 kg)  12/23/15 233 lb 9.6 oz (106 kg)  12/09/15 234 lb 3.2 oz (106.2 kg)   Patient was seen by this RD on 4/18, which was the time of his partial colectomy to remove what was found to be adenocarcinoma of sigmoid colon. At that time he weighed 235 lbs. 4.5 months later, he has lost 40 lbs.   Patient does have a very odd disposition and appears very uncomfortable/anxious. He is in bed with the sheet pulled up to his chin, aimlessly staring.   He states "I have not eaten in 6 weeks". He says he has absolutely no appetite. He denies any n/v/c/d. He apparently has had trouble swallowing/food "getting stuck". Wife, who is at bedside, reports the patient had an "emergent" EGD that revealed relatively little, just inflammation. He was given something to take (carafate?) but this made him throw up and he refused to take it more then once.   Patient apparently has already been tried on appetite stimulants. They state patient took megace, hated the taste and felt it wasn't working so he stopped taking it after only a few doses. Similarly, he took  marinol which "worked only for 3 days" and then pt felt it was not working, and stopped taking that. However, documentation of these being prescribed is not see. He is told that appetite stimulants, such as the megace, often take time to establish an effect and he likely quit taking it long before it would have shown efficacy.   He only just recently started trying oral  supplements. He has been drinking 1-2 Boost supplements per day. This is not nearly enough to meet his needs. Pt and wife are told of the Ensure program. Gave Butter Pecan Sample to try which he liked. Will order a case.   Pt is rather pessimistic/unwilling to try food. RD stated he will not be able to meet his nutritional needs with a few supplements a day, RD asked if he thought he could drink other liquids such as soup since he can apparently tolerate Boost. He said he wouldn't be able to. In fact, all recommendations he said would not work or would make him throw up.  RD asked if he felt the fear of throwing up was a big factor of his poor intake and he stated "i guess so". The only think he drinks other than Boost is Water.   Wife herself appeared to be very worn down/frustrated. Pt himself stated that he would not be able to eat anything, but wife said "we will try".   RN's had stated patient also is noncompliant with his nausea medications.   From first encounter and from staff history, Pt's poor nutritional status appears to largely be self-inflicted by patient's rather solid mindset and unwillingness to take meds, listen to recommendations, or try different foods/drinks.   RD will discuss appetite stimulants with MD and continue  to follow.   Burtis Junes RD, LDN, [place] [facility] Pager: [phone] 02/17/2016 11:16 AM

## 2016-02-17 NOTE — Patient Instructions (Signed)
[facility] Discharge Instructions for Patients Receiving Chemotherapy   Beginning January 23rd 2017 lab work for the [facility] will be done in the  Main lab at [facility] on 1st floor. If you have a lab appointment with the [facility] please come in thru the  Main Entrance and check in at the main information desk   Today you received the following chemotherapy agents: Oxaliplatin, leucovorin, and fluorouracil.   You also received IV potassium because your potassium level was low.   Please also pick up your potassium prescription from the pharmacy and take as directed.     If you develop nausea and vomiting, or diarrhea that is not controlled by your medication, call the clinic.  The clinic phone number is [phone]. Office hours are Monday-Friday 8:30am-5:00pm.  BELOW ARE SYMPTOMS THAT SHOULD BE REPORTED IMMEDIATELY:  *FEVER GREATER THAN 101.0 F  *CHILLS WITH OR WITHOUT FEVER  NAUSEA AND VOMITING THAT IS NOT CONTROLLED WITH YOUR NAUSEA MEDICATION  *UNUSUAL SHORTNESS OF BREATH  *UNUSUAL BRUISING OR BLEEDING  TENDERNESS IN MOUTH AND THROAT WITH OR WITHOUT PRESENCE OF ULCERS  *URINARY PROBLEMS  *BOWEL PROBLEMS  UNUSUAL RASH Items with * indicate a potential emergency and should be followed up as soon as possible. If you have an emergency after office hours please contact your primary care physician or go to the nearest emergency department.  Please call the clinic during office hours if you have any questions or concerns.   You may also contact the Patient Navigator at [phone] should you have any questions or need assistance in obtaining follow up care.      Resources For Cancer Patients and their Caregivers ? American Cancer Society: Can assist with transportation, wigs, general needs, runs Look Good Feel Better.        603 804 2250 ? Cancer Care: Provides financial assistance, online support groups, medication/co-pay assistance.   1-800-813-HOPE 317 506 7372) ? [facility] Assists [place] cancer patients and their families through emotional , educational and financial support.  (506) 003-0557 ? [place] DSS Where to apply for food stamps, Medicaid and utility assistance. 405-544-7773 ? [facility]: Transportation to medical appointments. (854) 839-7780 ? Social Security Administration: May apply for disability if have a Stage IV cancer. 438 864 7288 7824314808 ? [employer], Disability and Transit Services: Assists with nutrition, care and transit needs. 808-196-3039

## 2016-02-21 ENCOUNTER — Encounter (HOSPITAL_BASED_OUTPATIENT_CLINIC_OR_DEPARTMENT_OTHER): Payer: BLUE CROSS/BLUE SHIELD

## 2016-02-21 ENCOUNTER — Ambulatory Visit (HOSPITAL_COMMUNITY): Payer: BLUE CROSS/BLUE SHIELD

## 2016-02-21 ENCOUNTER — Encounter (HOSPITAL_BASED_OUTPATIENT_CLINIC_OR_DEPARTMENT_OTHER): Payer: BLUE CROSS/BLUE SHIELD | Admitting: Oncology

## 2016-02-21 ENCOUNTER — Encounter (HOSPITAL_COMMUNITY): Payer: Self-pay | Admitting: Oncology

## 2016-02-21 ENCOUNTER — Ambulatory Visit (HOSPITAL_COMMUNITY): Payer: BLUE CROSS/BLUE SHIELD | Admitting: Oncology

## 2016-02-21 DIAGNOSIS — C7989 Secondary malignant neoplasm of other specified sites: Secondary | ICD-10-CM

## 2016-02-21 DIAGNOSIS — C187 Malignant neoplasm of sigmoid colon: Secondary | ICD-10-CM

## 2016-02-21 MED ORDER — PEGFILGRASTIM INJECTION 6 MG/0.6ML ~~LOC~~
6.0000 mg | PREFILLED_SYRINGE | Freq: Once | SUBCUTANEOUS | Status: AC
  Administered 2016-02-21: 6 mg via SUBCUTANEOUS

## 2016-02-21 MED ORDER — PEGFILGRASTIM INJECTION 6 MG/0.6ML ~~LOC~~
PREFILLED_SYRINGE | SUBCUTANEOUS | Status: AC
Start: 2016-02-21 — End: 2016-02-21
  Filled 2016-02-21: qty 0.6

## 2016-02-21 NOTE — Progress Notes (Signed)
Derek Kaufmann, MD Derek Rodriguez [id number]  Adenocarcinoma of sigmoid colon [facility])  CURRENT THERAPY: FOLFOX beginning on 11/25/2015 with multiple dose reductions secondary to side effects.  INTERVAL HISTORY: Derek Rodriguez 55 y.o. 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 Arnoldo Morale.    Adenocarcinoma of sigmoid colon ([place])   09/09/2015 - 09/12/2015 Hospital Admission    Diverticulitis of colon with perforation      09/12/2015 Imaging    CT abd/pelvis- Continued severe inflammation of the proximal half of the sigmoid colon. There is a 2.6 cm oval low-attenuation structure extending between the posterior wall of the inflamed sigmoid colon and the dome of the bladder....      09/27/2015 Tumor Marker    CEA: 2.5       10/03/2015 Procedure    Partial colectomy, splenic flexure takedown by Dr. Arnoldo Morale      10/03/2015 Pathology Results    FULL THICKNESS INVASIVE MODERATELY TO POORLY DIFFERENTIATED ADENOCARCINOMA WITH SURFACE ULCERATION. TUMOR INVADES THROUGH MUSCULARIS MUCOSA AND PERFORATES SEROSAL SURFACE. - TUMOR MEASURES 10.3 CM IN GREATEST DIMENSION. - EXTENSIVE LYMPH/VASCULAR INVASI      10/13/2015 Pathology Results    MSI STABLE      10/20/2015 Imaging    CT chest- Negative. No evidence of metastatic disease or other active disease within the thorax.      11/04/2015 - 11/04/2015 Chemotherapy    [place]      11/12/2015 Miscellaneous    ED visit with nausea and decreased po intake      11/18/2015 Procedure    Port-A-Cath insertion by Dr. Arnoldo Morale      11/25/2015 Treatment Plan Change    From Xelox to FOLFOX      11/25/2015 -  Chemotherapy    FOLFOX      01/06/2016 Treatment Plan Change    Oxaliplatin dose reduced by 20% for cycle #4 and 5FU bolus was discontinued.       He continues to have ongoing nausea with vomiting and intolerance to treatment.  He notes that he feels better  today than he has in the past 2-3 weeks.  His weight is up 2 lbs.  He is using ensure/boost to supplement his diet.  He is encouraged to continue with this practice and try 4 bottles per day.  We discussed options moving forward including HIPEC therapy.  I have offered him to forego his final 5 cycles of treatment and pursue HIPEC therapy.  He is not interested in this whatsoever.  Review of Systems  Constitutional: Positive for malaise/fatigue and weight loss. Negative for chills and fever.  HENT: Negative.   Eyes: Negative.   Respiratory: Negative.  Negative for cough.   Cardiovascular: Negative.  Negative for chest pain.  Gastrointestinal: Positive for nausea and vomiting. Negative for abdominal pain, constipation and diarrhea.  Genitourinary: Negative.   Musculoskeletal: Negative.   Skin: Negative.   Neurological: Positive for weakness.  Endo/Heme/Allergies: Negative.     Past Medical History:  Diagnosis Date  . Adenocarcinoma of sigmoid colon ([place]) 10/18/2015  . Arthritis   . Diverticulitis   . Hypertension     Past Surgical History:  Procedure Laterality Date  . BIOPSY  02/06/2016   Procedure: BIOPSY;  Surgeon: Daneil Dolin, MD;  [facility];  Service: Endoscopy;;  esophagus  . ESOPHAGEAL DILATION  02/06/2016   Procedure: ESOPHAGEAL DILATION;  Surgeon: Cristopher Estimable  Rourk, MD;  Location[facility];  Service: Endoscopy;;  . ESOPHAGOGASTRODUODENOSCOPY N/A 02/06/2016   Procedure: ESOPHAGOGASTRODUODENOSCOPY (EGD);  Surgeon: Daneil Dolin, MD;  Location: [facility];  Service: Endoscopy;  Laterality: N/A;  115  . PARTIAL COLECTOMY N/A 10/03/2015   Procedure: PARTIAL COLECTOMY;  Surgeon: Aviva Signs, MD;  Location: [facility] ORS;  Service: General;  Laterality: N/A;  . PORTACATH PLACEMENT N/A 11/18/2015   Procedure: INSERTION PORT-A-CATH LEFT SUBCLAVIAN;  Surgeon: Aviva Signs, MD;  Location: [facility] ORS;  Service: General;  Laterality: N/A;    Family History  Problem Relation  Age of Onset  . Diabetes Father     Social History   Social History  . Marital status: Married    Spouse name: N/A  . Number of children: N/A  . Years of education: N/A   Social History Main Topics  . Smoking status: Former Smoker    Types: 63, Pipe    Quit date: 09/26/1985  . Smokeless tobacco: Former Systems developer    Types: Snuff    Quit date: 06/29/2015  . Alcohol use No     Comment: occ  . Drug use: No  . Sexual activity: Yes    Birth control/ protection: None   Other Topics Concern  . None   Social History Narrative  . None     PHYSICAL EXAMINATION  ECOG PERFORMANCE STATUS: 1 - Symptomatic but completely ambulatory  Vitals:   02/21/16 1531  BP: 107/84  Pulse: 97  Resp: 16  Temp: 97.9 F (36.6 C)    GENERAL:alert, no distress, cachectic, comfortable, cooperative, smiling and accompanied by wife in exam room. SKIN: skin color, texture, turgor are normal, no rashes or significant lesions HEAD: Normocephalic, No masses, lesions, tenderness or abnormalities EYES: normal, EOMI, Conjunctiva are pink and non-injected EARS: External ears normal OROPHARYNX:lips, buccal mucosa, and tongue normal and mucous membranes are moist  NECK: supple, trachea midline LYMPH:  no palpable lymphadenopathy BREAST:not examined LUNGS: clear to auscultation  HEART: regular rate & rhythm ABDOMEN:abdomen soft and normal bowel sounds BACK: Back symmetric, no curvature. EXTREMITIES:less then 2 second capillary refill, no joint deformities, effusion, or inflammation, no skin discoloration, no cyanosis  NEURO: alert & oriented x 3 with fluent speech, no focal motor/sensory deficits, gait normal   LABORATORY DATA: CBC    Component Value Date/Time   WBC 10.6 (H) 02/17/2016 0851   RBC 5.67 02/17/2016 0851   HGB 16.5 02/17/2016 0851   HCT 46.9 02/17/2016 0851   PLT 220 02/17/2016 0851   MCV 82.7 02/17/2016 0851   MCH 29.1 02/17/2016 0851   MCHC 35.2 02/17/2016 0851   RDW 17.4 (H)  02/17/2016 0851   LYMPHSABS 1.8 02/17/2016 0851   MONOABS 1.3 (H) 02/17/2016 0851   EOSABS 0.1 02/17/2016 0851   BASOSABS 0.0 02/17/2016 0851      Chemistry      Component Value Date/Time   NA 135 02/17/2016 0851   K 2.9 (L) 02/17/2016 0851   CL 96 (L) 02/17/2016 0851   CO2 25 02/17/2016 0851   BUN 17 02/17/2016 0851   CREATININE 1.28 (H) 02/17/2016 0851      Component Value Date/Time   CALCIUM 9.6 02/17/2016 0851   ALKPHOS 133 (H) 02/17/2016 0851   AST 67 (H) 02/17/2016 0851   ALT 69 (H) 02/17/2016 0851   BILITOT 1.1 02/17/2016 0851        PENDING LABS:   RADIOGRAPHIC STUDIES:  Dg Neck Soft Tissue  Result Date: 01/30/2016 CLINICAL  DATA:  New cough.  Colon carcinoma. EXAM: NECK SOFT TISSUES - 1+ VIEW COMPARISON:  None. FINDINGS: Frontal and lateral views were obtained. The epiglottis and aryepiglottic folds appear normal. Prevertebral soft tissues are normal. No air-fluid level to suggest abscess. There is extensive degenerative change in the mid cervical spine. Tongue base region appears normal. Visualized tracheal air column appears normal. IMPRESSION: Multilevel degenerative change in the cervical spine. No soft tissue lesion. Epiglottis and aryepiglottic folds appear normal. Visualized tracheal air column appears normal. Electronically Signed   By: Lowella Grip III M.D.   On: 01/30/2016 17:37   Dg Chest 2 View  Result Date: 01/30/2016 CLINICAL DATA:  Nonproductive cough and shortness of breath, several weeks duration. Symptoms worsening today. EXAM: CHEST  2 VIEW COMPARISON:  11/18/2015 FINDINGS: Heart size is normal. Mediastinal shadows are normal. Power port inserted from a left subclavian approach has its tip in the SVC at the azygos level. The lungs are clear. The vascularity is normal. No effusions. No bone abnormality. IMPRESSION: No active disease. Electronically Signed   By: Nelson Chimes M.D.   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 S/P partial colectomy by Dr. Arnoldo Morale on 10/03/2015.  Now on FOLFOX adjuvant therapy beginning on 11/25/2015 with Oxaliplatin reduced by 25% after intolerance to Xelox on 10/25/2015.  Oncology history updated.  Pre-treatment labs as ordered: CBC diff, CMET.  I personally reviewed and went over laboratory results with the patient.  The results are noted within this dictation.  Labs satisfy treatment parameters the other day.   GI notes are reviewed.  His weight is up 2 lbs.  He is using more Ensure/Boost to supplement his diet.  Neulasta injection is being given today.  We discussed forgoing his final 5 treatments and pursuing HIPEC therapy, but he refuses "more surgery."  In the most elementary terms, he is provided education regarding this procedure.  He is not interested at this time.  He is provided some printed information regarding HIPEC.  Return in 1-2 weeks for follow-up.   ORDERS PLACED FOR THIS ENCOUNTER: No orders of the defined types were placed in this encounter.   MEDICATIONS PRESCRIBED THIS ENCOUNTER: Meds ordered this encounter  Medications  . CARAFATE 1 GM/10ML suspension    Sig: TAKE 10MLS (2 TEASPOONFULS) BY MOUTH 4 TIMES A DAY FOR 7 DAYS    Refill:  0    THERAPY PLAN:  Treatment as planned unless he changes his mind regarding HIPEC therapy.  All questions were answered. The patient knows to call the clinic with any problems, questions or concerns. We can certainly see the patient much sooner if necessary.  Patient and plan discussed with Dr. Ancil Linsey and she is in agreement with  the aforementioned.   This note is electronically signed by: Doy Mince 02/21/2016 6:45 PM

## 2016-02-21 NOTE — Patient Instructions (Signed)
[place] at [facility] Discharge Instructions  RECOMMENDATIONS MADE BY THE CONSULTANT AND ANY TEST RESULTS WILL BE SENT TO YOUR REFERRING PHYSICIAN.  You were seen by Gershon Mussel today. Treatment as planned Continue Boost/Ensure 4 per day  Return in 2-4 weeks for follow up with labs.   Thank you for choosing [place] at [facility] to provide your oncology and hematology care.  To afford each patient quality time with our provider, please arrive at least 15 minutes before your scheduled appointment time.   Beginning January 23rd 2017 lab work for the [facility] will be done in the  Main lab at [facility] on 1st floor. If you have a lab appointment with the [facility] please come in thru the  Main Entrance and check in at the main information desk  You need to re-schedule your appointment should you arrive 10 or more minutes late.  We strive to give you quality time with our providers, and arriving late affects you and other patients whose appointments are after yours.  Also, if you no show three or more times for appointments you may be dismissed from the clinic at the providers discretion.     Again, thank you for choosing [facility].  Our hope is that these requests will decrease the amount of time that you wait before being seen by our physicians.       _____________________________________________________________  Should you have questions after your visit to [facility], please contact our office at [phone] between the hours of 8:30 a.m. and 4:30 p.m.  Voicemails left after 4:30 p.m. will not be returned until the following business day.  For prescription refill requests, have your pharmacy contact our office.         Resources For Cancer Patients and their Caregivers ? American Cancer Society: Can assist with transportation, wigs, general needs, runs Look Good Feel Better.         5611716877 ? Cancer Care: Provides financial assistance, online support groups, medication/co-pay assistance.  1-800-813-HOPE 681-088-2494) ? [place] Assists [place] cancer patients and their families through emotional , educational and financial support.  620-200-8940 ? [place] DSS Where to apply for food stamps, Medicaid and utility assistance. 254-759-4896 ? RCATS: Transportation to medical appointments. 317-784-0881 ? Social Security Administration: May apply for disability if have a Stage IV cancer. 217-371-0908 339-204-7847 ? [employer], Disability and Transit Services: Assists with nutrition, care and transit needs. [place] Support Programs: @10RELATIVEDAYS @ > Cancer Support Group  2nd Tuesday of the month 1pm-2pm, Journey Room  > Creative Journey  3rd Tuesday of the month 1130am-1pm, Journey Room  > Look Good Feel Better  1st Wednesday of the month 10am-12 noon, Journey Room (Call [place] to register [phone])

## 2016-02-21 NOTE — Patient Instructions (Signed)
[place] at [facility] Discharge Instructions  RECOMMENDATIONS MADE BY THE CONSULTANT AND ANY TEST RESULTS WILL BE SENT TO YOUR REFERRING PHYSICIAN.  You were given Neulasta injection today. Return as scheduled.   Thank you for choosing [place] at [facility] to provide your oncology and hematology care.  To afford each patient quality time with our provider, please arrive at least 15 minutes before your scheduled appointment time.   Beginning January 23rd 2017 lab work for the [facility] will be done in the  Main lab at [facility] on 1st floor. If you have a lab appointment with the [facility] please come in thru the  Main Entrance and check in at the main information desk  You need to re-schedule your appointment should you arrive 10 or more minutes late.  We strive to give you quality time with our providers, and arriving late affects you and other patients whose appointments are after yours.  Also, if you no show three or more times for appointments you may be dismissed from the clinic at the providers discretion.     Again, thank you for choosing [facility].  Our hope is that these requests will decrease the amount of time that you wait before being seen by our physicians.       _____________________________________________________________  Should you have questions after your visit to [facility], please contact our office at [phone] between the hours of 8:30 a.m. and 4:30 p.m.  Voicemails left after 4:30 p.m. will not be returned until the following business day.  For prescription refill requests, have your pharmacy contact our office.         Resources For Cancer Patients and their Caregivers ? American Cancer Society: Can assist with transportation, wigs, general needs, runs Look Good Feel Better.        ([phone] ? Cancer Care: Provides financial assistance, online support  groups, medication/co-pay assistance.  1-800-813-HOPE 630 127 0920) ? [place] Assists [place] cancer patients and their families through emotional , educational and financial support.  479-863-6106 ? [place] DSS Where to apply for food stamps, Medicaid and utility assistance. 6075954465 ? RCATS: Transportation to medical appointments. (475) 287-3185 ? Social Security Administration: May apply for disability if have a Stage IV cancer. 3645924649 812-306-3807 ? [employer], Disability and Transit Services: Assists with nutrition, care and transit needs. [place] Support Programs: @10RELATIVEDAYS @ > Cancer Support Group  2nd Tuesday of the month 1pm-2pm, Journey Room  > Creative Journey  3rd Tuesday of the month 1130am-1pm, Journey Room  > Look Good Feel Better  1st Wednesday of the month 10am-12 noon, Journey Room (Call [place] to register [phone])

## 2016-02-21 NOTE — Progress Notes (Signed)
Pt given Neulasta injection SQ in left lower abdomen. Pt tolerated well. Pt stable and discharged home ambulatory. Pt to return as scheduled.

## 2016-02-21 NOTE — Assessment & Plan Note (Signed)
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 S/P partial colectomy by Dr. Arnoldo Morale on 10/03/2015.  Now on FOLFOX adjuvant therapy beginning on 11/25/2015 with Oxaliplatin reduced by 25% after intolerance to Xelox on 10/25/2015.  Oncology history updated.  Pre-treatment labs as ordered: CBC diff, CMET.  I personally reviewed and went over laboratory results with the patient.  The results are noted within this dictation.  Labs satisfy treatment parameters the other day.   GI notes are reviewed.  His weight is up 2 lbs.  He is using more Ensure/Boost to supplement his diet.  Neulasta injection is being given today.  We discussed forgoing his final 5 treatments and pursuing HIPEC therapy, but he refuses "more surgery."  In the most elementary terms, he is provided education regarding this procedure.  He is not interested at this time.  He is provided some printed information regarding HIPEC.  Return in 1-2 weeks for follow-up.

## 2016-02-24 ENCOUNTER — Ambulatory Visit (HOSPITAL_COMMUNITY): Payer: BLUE CROSS/BLUE SHIELD

## 2016-02-27 ENCOUNTER — Ambulatory Visit: Payer: BLUE CROSS/BLUE SHIELD | Admitting: Gastroenterology

## 2016-03-02 ENCOUNTER — Ambulatory Visit: Payer: BLUE CROSS/BLUE SHIELD | Admitting: Gastroenterology

## 2016-03-02 ENCOUNTER — Encounter (HOSPITAL_BASED_OUTPATIENT_CLINIC_OR_DEPARTMENT_OTHER): Payer: BLUE CROSS/BLUE SHIELD | Admitting: Hematology & Oncology

## 2016-03-02 ENCOUNTER — Encounter (HOSPITAL_BASED_OUTPATIENT_CLINIC_OR_DEPARTMENT_OTHER): Payer: BLUE CROSS/BLUE SHIELD

## 2016-03-02 ENCOUNTER — Inpatient Hospital Stay (HOSPITAL_COMMUNITY): Payer: BLUE CROSS/BLUE SHIELD

## 2016-03-02 VITALS — BP 120/82 | HR 88 | Temp 98.0°F | Resp 18 | Wt 200.5 lb

## 2016-03-02 DIAGNOSIS — C187 Malignant neoplasm of sigmoid colon: Secondary | ICD-10-CM

## 2016-03-02 DIAGNOSIS — C7989 Secondary malignant neoplasm of other specified sites: Secondary | ICD-10-CM | POA: Diagnosis not present

## 2016-03-02 DIAGNOSIS — D649 Anemia, unspecified: Secondary | ICD-10-CM

## 2016-03-02 DIAGNOSIS — Z95828 Presence of other vascular implants and grafts: Secondary | ICD-10-CM

## 2016-03-02 DIAGNOSIS — Z5111 Encounter for antineoplastic chemotherapy: Secondary | ICD-10-CM | POA: Diagnosis not present

## 2016-03-02 DIAGNOSIS — F329 Major depressive disorder, single episode, unspecified: Secondary | ICD-10-CM

## 2016-03-02 DIAGNOSIS — R5382 Chronic fatigue, unspecified: Secondary | ICD-10-CM

## 2016-03-02 DIAGNOSIS — G4700 Insomnia, unspecified: Secondary | ICD-10-CM

## 2016-03-02 DIAGNOSIS — F32A Depression, unspecified: Secondary | ICD-10-CM

## 2016-03-02 LAB — COMPREHENSIVE METABOLIC PANEL
ALBUMIN: 3.7 g/dL (ref 3.5–5.0)
ALT: 46 U/L (ref 17–63)
AST: 47 U/L — AB (ref 15–41)
Alkaline Phosphatase: 118 U/L (ref 38–126)
Anion gap: 13 (ref 5–15)
BUN: 16 mg/dL (ref 6–20)
CHLORIDE: 99 mmol/L — AB (ref 101–111)
CO2: 26 mmol/L (ref 22–32)
Calcium: 9.4 mg/dL (ref 8.9–10.3)
Creatinine, Ser: 1.16 mg/dL (ref 0.61–1.24)
GFR calc Af Amer: >60 mL/min (ref >60)
Glucose, Bld: 147 mg/dL — ABNORMAL HIGH (ref 65–99)
POTASSIUM: 3.4 mmol/L — AB (ref 3.5–5.1)
SODIUM: 138 mmol/L (ref 135–145)
Total Bilirubin: 0.9 mg/dL (ref 0.3–1.2)
Total Protein: 6.8 g/dL (ref 6.5–8.1)

## 2016-03-02 LAB — CBC WITH DIFFERENTIAL/PLATELET
BASOS ABS: 0 10*3/uL (ref 0.0–0.1)
BASOS PCT: 0 %
EOS ABS: 0.2 10*3/uL (ref 0.0–0.7)
EOS PCT: 2 %
HCT: 41.5 % (ref 39.0–52.0)
Hemoglobin: 13.9 g/dL (ref 13.0–17.0)
Lymphocytes Relative: 17 %
Lymphs Abs: 1.4 10*3/uL (ref 0.7–4.0)
MCH: 30.2 pg (ref 26.0–34.0)
MCHC: 33.5 g/dL (ref 30.0–36.0)
MCV: 90 fL (ref 78.0–100.0)
MONO ABS: 0.8 10*3/uL (ref 0.1–1.0)
Monocytes Relative: 10 %
Neutro Abs: 5.6 10*3/uL (ref 1.7–7.7)
Neutrophils Relative %: 71 %
PLATELETS: 154 10*3/uL (ref 150–400)
RBC: 4.61 MIL/uL (ref 4.22–5.81)
RDW: 20 % — AB (ref 11.5–15.5)
WBC: 8 10*3/uL (ref 4.0–10.5)

## 2016-03-02 MED ORDER — LEUCOVORIN CALCIUM INJECTION 350 MG
400.0000 mg/m2 | Freq: Once | INTRAMUSCULAR | Status: AC
  Administered 2016-03-02: 920 mg via INTRAVENOUS
  Filled 2016-03-02: qty 46

## 2016-03-02 MED ORDER — HEPARIN SOD (PORK) LOCK FLUSH 100 UNIT/ML IV SOLN: 500.0000 [IU] | Freq: Once | INTRAVENOUS | Status: DC | PRN

## 2016-03-02 MED ORDER — PALONOSETRON HCL INJECTION 0.25 MG/5ML
0.2500 mg | Freq: Once | INTRAVENOUS | Status: AC
  Administered 2016-03-02: 0.25 mg via INTRAVENOUS

## 2016-03-02 MED ORDER — BUPROPION HCL ER (XL) 150 MG PO TB24: 150.0000 mg | ORAL_TABLET | Freq: Every day | ORAL | 3 refills | Status: DC

## 2016-03-02 MED ORDER — LORAZEPAM 2 MG/ML IJ SOLN
0.5000 mg | Freq: Once | INTRAMUSCULAR | Status: AC
  Administered 2016-03-02: 0.5 mg via INTRAVENOUS

## 2016-03-02 MED ORDER — OXALIPLATIN CHEMO INJECTION 100 MG/20ML
68.0000 mg/m2 | Freq: Once | INTRAVENOUS | Status: AC
  Administered 2016-03-02: 155 mg via INTRAVENOUS
  Filled 2016-03-02: qty 31

## 2016-03-02 MED ORDER — SODIUM CHLORIDE 0.9 % IV SOLN
Freq: Once | INTRAVENOUS | Status: AC
  Administered 2016-03-02: 11:00:00 via INTRAVENOUS
  Filled 2016-03-02: qty 5

## 2016-03-02 MED ORDER — PALONOSETRON HCL INJECTION 0.25 MG/5ML
INTRAVENOUS | Status: AC
  Filled 2016-03-02: qty 5

## 2016-03-02 MED ORDER — HEPARIN LOCK FLUSH 100 UNIT/ML IV SOLN: INTRAVENOUS | 0 refills | Status: DC

## 2016-03-02 MED ORDER — DEXTROSE 5 % IV SOLN
Freq: Once | INTRAVENOUS | Status: AC
  Administered 2016-03-02: 10:00:00 via INTRAVENOUS

## 2016-03-02 MED ORDER — LORAZEPAM 2 MG/ML IJ SOLN
INTRAMUSCULAR | Status: AC
  Filled 2016-03-02: qty 1

## 2016-03-02 MED ORDER — SODIUM CHLORIDE 0.9% FLUSH
10.0000 mL | INTRAVENOUS | Status: DC | PRN
  Administered 2016-03-02: 10 mL
  Filled 2016-03-02: qty 10

## 2016-03-02 MED ORDER — SODIUM CHLORIDE 0.9 % IV SOLN
2400.0000 mg/m2 | INTRAVENOUS | Status: DC
  Administered 2016-03-02: 5500 mg via INTRAVENOUS
  Filled 2016-03-02: qty 10

## 2016-03-02 NOTE — Patient Instructions (Addendum)
[place] at [facility] Discharge Instructions  RECOMMENDATIONS MADE BY THE CONSULTANT AND ANY TEST RESULTS WILL BE SENT TO YOUR REFERRING PHYSICIAN.  You saw Dr. Whitney Muse today. Wellbutrin has been prescribed for you.  Dr. Whitney Muse said you cannot do the Onpro. You need to return Monday for neulasta.  Follow up in 3 weeks to see Dr. Whitney Muse, lab work and chemo. Hold chemotherapy for 2 weeks. Please get out of bed and dressed daily.  Thank you for choosing [place] at [facility] to provide your oncology and hematology care.  To afford each patient quality time with our provider, please arrive at least 15 minutes before your scheduled appointment time.   Beginning January 23rd 2017 lab work for the [facility] will be done in the  Main lab at [facility] on 1st floor. If you have a lab appointment with the [place] please come in thru the  Main Entrance and check in at the main information desk  You need to re-schedule your appointment should you arrive 10 or more minutes late.  We strive to give you quality time with our providers, and arriving late affects you and other patients whose appointments are after yours.  Also, if you no show three or more times for appointments you may be dismissed from the clinic at the providers discretion.     Again, thank you for choosing [facility].  Our hope is that these requests will decrease the amount of time that you wait before being seen by our physicians.       _____________________________________________________________  Should you have questions after your visit to [facility], please contact our office at [phone] between the hours of 8:30 a.m. and 4:30 p.m.  Voicemails left after 4:30 p.m. will not be returned until the following business day.  For prescription refill requests, have your pharmacy contact our office.         Resources For Cancer Patients  and their Caregivers ? American Cancer Society: Can assist with transportation, wigs, general needs, runs Look Good Feel Better.        519-445-1912 ? Cancer Care: Provides financial assistance, online support groups, medication/co-pay assistance.  1-800-813-HOPE (661) 508-9852) ? [place] Assists [place] cancer patients and their families through emotional , educational and financial support.  628-288-6397 ? [place] DSS Where to apply for food stamps, Medicaid and utility assistance. 786-087-2522 ? RCATS: Transportation to medical appointments. 514-599-1025 ? Social Security Administration: May apply for disability if have a Stage IV cancer. [phone] 2670138147 ? [employer], Disability and Transit Services: Assists with nutrition, care and transit needs. [place] Support Programs: @10RELATIVEDAYS @ > Cancer Support Group  2nd Tuesday of the month 1pm-2pm, Journey Room  > Creative Journey  3rd Tuesday of the month 1130am-1pm, Journey Room  > Look Good Feel Better  1st Wednesday of the month 10am-12 noon, Journey Room (Call [place] to register [phone])

## 2016-03-02 NOTE — Patient Instructions (Signed)
[facility] Discharge Instructions for Patients Receiving Chemotherapy   Beginning January 23rd 2017 lab work for the Cancer Center will be done in the  Main lab at [facility] on 1st floor. If you have a lab appointment with the Cancer Center please come in thru the  Main Entrance and check in at the main information desk   Today you received the following chemotherapy agents Oxaliplatin,Leucovorin and 5FU. Follow-up as scheduled. Call clinic for any questions or concerns  To help prevent nausea and vomiting after your treatment, we encourage you to take your nausea medication   If you develop nausea and vomiting, or diarrhea that is not controlled by your medication, call the clinic.  The clinic phone number is [phone]. Office hours are Monday-Friday 8:30am-5:00pm.  BELOW ARE SYMPTOMS THAT SHOULD BE REPORTED IMMEDIATELY:  *FEVER GREATER THAN 101.0 F  *CHILLS WITH OR WITHOUT FEVER  NAUSEA AND VOMITING THAT IS NOT CONTROLLED WITH YOUR NAUSEA MEDICATION  *UNUSUAL SHORTNESS OF BREATH  *UNUSUAL BRUISING OR BLEEDING  TENDERNESS IN MOUTH AND THROAT WITH OR WITHOUT PRESENCE OF ULCERS  *URINARY PROBLEMS  *BOWEL PROBLEMS  UNUSUAL RASH Items with * indicate a potential emergency and should be followed up as soon as possible. If you have an emergency after office hours please contact your primary care physician or go to the nearest emergency department.  Please call the clinic during office hours if you have any questions or concerns.   You may also contact the Patient Navigator at [phone] should you have any questions or need assistance in obtaining follow up care.      Resources For Cancer Patients and their Caregivers ? American Cancer Society: Can assist with transportation, wigs, general needs, runs Look Good Feel Better.        1-888-227-6333 ? Cancer Care: Provides financial assistance, online support groups, medication/co-pay assistance.   1-800-813-HOPE (4673) ? [facility] Assists [place] cancer patients and their families through emotional , educational and financial support.  336-427-4357 ? [place] DSS Where to apply for food stamps, Medicaid and utility assistance. 336-342-1394 ? RCATS: Transportation to medical appointments. 336-347-2287 ? Social Security Administration: May apply for disability if have a Stage IV cancer. 336-342-7796 1-800-772-1213 ? [place] Aging, Disability and Transit Services: Assists with nutrition, care and transit needs. 336-349-2343         

## 2016-03-02 NOTE — Progress Notes (Signed)
[facility] Hematology/Oncology Progress Note  Name: Derek Rodriguez      MRN: [id number]     Date: 03/13/2016 Time:10:33 AM   REFERRING PHYSICIAN:  Aviva Signs, MD (Gen Surg)  REASON FOR CONSULT:  Adenocarcinoma of sigmoid colon.   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    Adenocarcinoma of sigmoid colon ([place])   09/09/2015 - 09/12/2015 Hospital Admission    Diverticulitis of colon with perforation      09/12/2015 Imaging    CT abd/pelvis- Continued severe inflammation of the proximal half of the sigmoid colon. There is a 2.6 cm oval low-attenuation structure extending between the posterior wall of the inflamed sigmoid colon and the dome of the bladder....      09/27/2015 Tumor Marker    CEA: 2.5       10/03/2015 Procedure    Partial colectomy, splenic flexure takedown by Dr. Arnoldo Morale      10/03/2015 Pathology Results    FULL THICKNESS INVASIVE MODERATELY TO POORLY DIFFERENTIATED ADENOCARCINOMA WITH SURFACE ULCERATION. TUMOR INVADES THROUGH MUSCULARIS MUCOSA AND PERFORATES SEROSAL SURFACE. - TUMOR MEASURES 10.3 CM IN GREATEST DIMENSION. - EXTENSIVE LYMPH/VASCULAR INVASI      10/13/2015 Pathology Results    MSI STABLE      10/20/2015 Imaging    CT chest- Negative. No evidence of metastatic disease or other active disease within the thorax.      11/04/2015 - 11/04/2015 Chemotherapy    CapeOX      11/12/2015 Miscellaneous    ED visit with nausea and decreased po intake      11/18/2015 Procedure    Port-A-Cath insertion by Dr. Arnoldo Morale      11/25/2015 Treatment Plan Change    From Xelox to FOLFOX      11/25/2015 -  Chemotherapy    FOLFOX      01/06/2016 Treatment Plan Change    Oxaliplatin dose reduced by 20% for cycle #4 and 5FU bolus was discontinued.        HISTORY OF PRESENT ILLNESS:   Derek Rodriguez is a 55 year old white American man with a past medical history significant for diverticulitis and HTN who recently was diagnosed  with a Stage IIIC ([id number]) adenocarcinoma of sigmoid colon cancer, S/P partial colectomy by Dr. Arnoldo Morale.  Derek Rodriguez is accompanied by his wife and presents in treatment bed. He is here for Cycle #7 FOLFOX.  His wife reports he has been drinking 4 Boost per day. Patient reports he still wasn't eating so he began drinking the Boost. "I have zero appetite". Patient later states, "I'm just trying to make it through this chemo and hopefully I'll get an appetite again".  He does not believe he is depressed, "Everything is going alright except the not eating". Admits he doesn't do much of nothing other than lay in the bed. His wife states he does not talk a lot. They used to do a lot together but now they just watch television together. When he sits on the porch on cooler days, people will stop by to visit with him. His wife states he doesn't do much other than that and he does not get dressed everyday.   He reports new pain in his right knee that began about two days ago. He is not sure what caused it. He denies swelling in his knee. States his knee has done this before, where it pops out of place and takes a couple days to pop  back in. States, "Right now I can't walk" secondary to the knee pain. He is able to walk to the bathroom and the kitchen. He has a walker at home. When I spoke with the patient about physical therapy, he states "I don't like therapists".   His wife denies any sores on his back or bottom from inactivity. She has been trying to help him exercise and massages his legs for him.   While speaking about holding treatment and getting the patient's depression taken care of, the patient states he wants to "get this taken care of and then it'll be okay".   He takes Ambien at night to help with sleep. Admits he sleeps well when he takes it.  He denies nausea. He takes his anti-nausea medication and protonix.   He takes Lexapro in the mornings. His wife occasionally gives him an Ativan  when she gets home form work if he appears uncomfortable and is burping. This is not daily.   His wife admits he is a little 'spacey' today from the benadryl with treatment. He does not act like this daily.  He denies diarrhea.  His sister in law, who is a Marine scientist, removes his pump. His wife works at a urology clinic where the nurses there have also offered to remove his pump, if needed. Vitals - 1 value per visit 03/02/2016 02/21/2016 02/17/2016 02/06/2016 02/06/2016  Weight (lb) 200.5 197 195.4 203 203   Vitals - 1 value per visit 02/06/2016 02/03/2016 01/30/2016 01/27/2016 01/24/2016  Weight (lb)  205.6 212 215    Vitals - 1 value per visit 01/16/2016 01/13/2016 01/06/2016  Weight (lb)  227.4 255    Review of Systems  Constitutional: Positive for malaise/fatigue. Negative for chills, diaphoresis, fever and weight loss.  HENT: Negative.  Negative for congestion and nosebleeds.   Eyes: Negative.   Respiratory: Negative.  Negative for cough, hemoptysis and sputum production.   Cardiovascular: Negative.  Negative for chest pain and palpitations.  Gastrointestinal: Negative for abdominal pain, blood in stool, diarrhea, heartburn and melena.  Genitourinary: Negative.   Musculoskeletal: Negative for myalgias and neck pain.       Right knee pain  Skin: Negative.  Negative for rash.  Neurological: Positive for weakness. Negative for headaches.  Endo/Heme/Allergies: Negative.   Psychiatric/Behavioral: The patient is nervous/anxious.   14 point review of systems was performed and is negative except as detailed under history of present illness and above   PAST MEDICAL HISTORY:   Past Medical History:  Diagnosis Date  . Adenocarcinoma of sigmoid colon ([place]) 10/18/2015  . Arthritis   . Diverticulitis   . Hypertension     ALLERGIES: No Known Allergies    MEDICATIONS: I have reviewed the patient's current medications.    Current Outpatient Prescriptions on File Prior to Visit  Medication Sig Dispense  Refill  . CARAFATE 1 GM/10ML suspension TAKE 10MLS (2 TEASPOONFULS) BY MOUTH 4 TIMES A DAY FOR 7 DAYS  0  . granisetron (SANCUSO) 3.1 MG/24HR Apply to skin starting 24 hours before chemotherapy. Remove after 7 days. 1 each 5  . ibuprofen (ADVIL,MOTRIN) 200 MG tablet Take 400 mg by mouth every 6 (six) hours as needed for mild pain or moderate pain.    Marland Kitchen lidocaine-prilocaine (EMLA) cream Apply a quarter size amount to port site 1 hour prior to chemo. Do not rub in. Cover with plastic wrap. 30 g 3  . LORazepam (ATIVAN) 0.5 MG tablet Take 1 tablet (0.5 mg total) by mouth every  6 (six) hours as needed for anxiety. 120 tablet 0  . omeprazole (PRILOSEC) 40 MG capsule Take 1 capsule (40 mg total) by mouth daily. 30 capsule 6  . ondansetron (ZOFRAN) 8 MG tablet Take 1 tablet (8 mg total) by mouth every 8 (eight) hours as needed for nausea or vomiting. 30 tablet 2  . OXALIPLATIN IV Inject into the vein. (chemo) To be given once every 14 days    . oxyCODONE-acetaminophen (PERCOCET) 7.5-325 MG tablet Take 1-2 tablets by mouth every 4 (four) hours as needed for moderate pain. 90 tablet 0  . potassium chloride SA (K-DUR,KLOR-CON) 20 MEQ tablet Take 2 tablets (40 mEq total) by mouth 2 (two) times daily. 60 tablet 0  . prochlorperazine (COMPAZINE) 10 MG tablet Take 1 tablet (10 mg total) by mouth every 6 (six) hours as needed for nausea or vomiting. 90 tablet 6  . promethazine (PHENERGAN) 25 MG suppository Place 1 suppository (25 mg total) rectally every 6 (six) hours as needed for nausea or vomiting. 6 each 0  . Sodium Chloride Flush (NORMAL SALINE FLUSH) 0.9 % SOLN Flush port with 10 ml of normal saline. 10 Syringe 0  . zolpidem (AMBIEN) 10 MG tablet Take 1 tablet (10 mg total) by mouth at bedtime as needed for sleep. 30 tablet 3   No current facility-administered medications on file prior to visit.      PAST SURGICAL HISTORY Past Surgical History:  Procedure Laterality Date  . BIOPSY  02/06/2016    Procedure: BIOPSY;  Surgeon: Daneil Dolin, MD;  [facility];  Service: Endoscopy;;  esophagus  . ESOPHAGEAL DILATION  02/06/2016   Procedure: ESOPHAGEAL DILATION;  Surgeon: Daneil Dolin, MD;  Location[facility];  Service: Endoscopy;;  . ESOPHAGOGASTRODUODENOSCOPY N/A 02/06/2016   Procedure: ESOPHAGOGASTRODUODENOSCOPY (EGD);  Surgeon: Daneil Dolin, MD;  Location: [facility];  Service: Endoscopy;  Laterality: N/A;  115  . PARTIAL COLECTOMY N/A 10/03/2015   Procedure: PARTIAL COLECTOMY;  Surgeon: Aviva Signs, MD;  Location: [facility];  Service: General;  Laterality: N/A;  . PORTACATH PLACEMENT N/A 11/18/2015   Procedure: INSERTION PORT-A-CATH LEFT SUBCLAVIAN;  Surgeon: Aviva Signs, MD;  Location: [facility];  Service: General;  Laterality: N/A;    FAMILY HISTORY: Family History  Problem Relation Age of Onset  . Diabetes Father    No known family history of cancer Mother alive at age 62- healthy Father deceased at age of 45 from heart disease, complications of DM 78 son 98 years old- healthy 58 grandson 53 year old- healthy  SOCIAL HISTORY:  reports that he quit smoking about 30 years ago. His smoking use included Cigars and Pipe. He quit smokeless tobacco use about 8 months ago. His smokeless tobacco use included Snuff. He reports that he does not drink alcohol or use drugs.  He works at [employer].  He is married x 37 years.  He is [employer].  PERFORMANCE STATUS: The patient's performance status is 0 - Asymptomatic  PHYSICAL EXAM: Most Recent Vital Signs: Vitals - 1 value per visit 8/59/2924  SYSTOLIC 462  DIASTOLIC 82  Pulse 88  Temperature 98  Respirations 18  Weight (lb) 200.5  Height   BMI 27.19  VISIT REPORT    General appearance: alert, cooperative, appears stated age, no distress, mildly obese and accompanied by his wife. Head: Normocephalic, without obvious abnormality, atraumatic Eyes: negative findings: lids and lashes normal, conjunctivae and sclerae  normal and corneas clear Throat: normal findings: lips normal  without lesions, buccal mucosa normal, tongue midline and normal, soft palate, uvula, and tonsils normal and oropharynx pink & moist without lesions or evidence of thrush and abnormal findings: dentition: poor Neck: no adenopathy, supple, symmetrical, trachea midline and thyroid not enlarged, symmetric, no tenderness/mass/nodules Lungs: clear to auscultation bilaterally and normal percussion bilaterally Heart: regular rate and rhythm, S1, S2 normal, no murmur, click, rub or gallop Abdomen: soft, non-tender; bowel sounds normal; no masses,  no organomegaly and low-midline surgical site is well healed Extremities: extremities normal, atraumatic, no cyanosis or edema Skin: Skin color, texture, turgor normal. No rashes or lesions Neurologic: Alert and oriented X 3, normal strength and tone. Normal symmetric reflexes. Normal coordination and gait  LABORATORY DATA:  I have reviewed the data as listed. Results for XZAYVIER, FAGIN (MRN [id number]) as of 03/02/2016 12:44  Ref. Range 03/02/2016 09:21  Sodium Latest Ref Range: 135 - 145 mmol/L 138  Potassium Latest Ref Range: 3.5 - 5.1 mmol/L 3.4 (L)  Chloride Latest Ref Range: 101 - 111 mmol/L 99 (L)  CO2 Latest Ref Range: 22 - 32 mmol/L 26  BUN Latest Ref Range: 6 - 20 mg/dL 16  Creatinine Latest Ref Range: 0.61 - 1.24 mg/dL 1.16  Calcium Latest Ref Range: 8.9 - 10.3 mg/dL 9.4  EGFR (Non-African Amer.) Latest Ref Range: >60 mL/min >60  EGFR (African American) Latest Ref Range: >60 mL/min >60  Glucose Latest Ref Range: 65 - 99 mg/dL 147 (H)  Anion gap Latest Ref Range: 5 - 15  13  Alkaline Phosphatase Latest Ref Range: 38 - 126 U/L 118  Albumin Latest Ref Range: 3.5 - 5.0 g/dL 3.7  AST Latest Ref Range: 15 - 41 U/L 47 (H)  ALT Latest Ref Range: 17 - 63 U/L 46  Total Protein Latest Ref Range: 6.5 - 8.1 g/dL 6.8  Total Bilirubin Latest Ref Range: 0.3 - 1.2 mg/dL 0.9  WBC Latest Ref Range:  4.0 - 10.5 K/uL 8.0  RBC Latest Ref Range: 4.22 - 5.81 MIL/uL 4.61  Hemoglobin Latest Ref Range: 13.0 - 17.0 g/dL 13.9  HCT Latest Ref Range: 39.0 - 52.0 % 41.5  MCV Latest Ref Range: 78.0 - 100.0 fL 90.0  MCH Latest Ref Range: 26.0 - 34.0 pg 30.2  MCHC Latest Ref Range: 30.0 - 36.0 g/dL 33.5  RDW Latest Ref Range: 11.5 - 15.5 % 20.0 (H)  Platelets Latest Ref Range: 150 - 400 K/uL 154  Neutrophils Latest Units: % 71  Lymphocytes Latest Units: % 17  Monocytes Relative Latest Units: % 10  Eosinophil Latest Units: % 2  Basophil Latest Units: % 0  NEUT# Latest Ref Range: 1.7 - 7.7 K/uL 5.6  Lymphocyte # Latest Ref Range: 0.7 - 4.0 K/uL 1.4  Monocyte # Latest Ref Range: 0.1 - 1.0 K/uL 0.8  Eosinophils Absolute Latest Ref Range: 0.0 - 0.7 K/uL 0.2  Basophils Absolute Latest Ref Range: 0.0 - 0.1 K/uL 0.0    Lab Results  Component Value Date   CEA 2.5 09/27/2015     RADIOGRAPHY: I have personally reviewed the radiological images as listed and agreed with the findings in the report. Study Result   CLINICAL DATA:  New cough.  Colon carcinoma.  EXAM: NECK SOFT TISSUES - 1+ VIEW  COMPARISON:  None.  FINDINGS: Frontal and lateral views were obtained. The epiglottis and aryepiglottic folds appear normal. Prevertebral soft tissues are normal. No air-fluid level to suggest abscess. There is extensive degenerative change in the mid cervical spine. Tongue base  region appears normal. Visualized tracheal air column appears normal.  IMPRESSION: Multilevel degenerative change in the cervical spine. No soft tissue lesion. Epiglottis and aryepiglottic folds appear normal. Visualized tracheal air column appears normal.   Electronically Signed   By: Lowella Grip III M.D.   On: 01/30/2016 17:37   Study Result   CLINICAL DATA:  Nonproductive cough and shortness of breath, several weeks duration. Symptoms worsening today.  EXAM: CHEST  2 VIEW  COMPARISON:   11/18/2015  FINDINGS: Heart size is normal. Mediastinal shadows are normal. Power port inserted from a left subclavian approach has its tip in the SVC at the azygos level. The lungs are clear. The vascularity is normal. No effusions. No bone abnormality.  IMPRESSION: No active disease.   Electronically Signed   By: Nelson Chimes M.D.   On: 01/30/2016 16:07     PATHOLOGY:    Diagnosis Colon, segmental resection, sigmoid - FULL THICKNESS INVASIVE MODERATELY TO POORLY DIFFERENTIATED ADENOCARCINOMA WITH SURFACE ULCERATION. 1 of 4 FINAL for Conway, Susumu [phone]) Diagnosis(continued) - TUMOR INVADES THROUGH MUSCULARIS MUCOSA AND PERFORATES SEROSAL SURFACE. - TUMOR MEASURES 10.3 CM IN GREATEST DIMENSION. - EXTENSIVE LYMPH/VASCULAR INVASION IS IDENTIFIED. - DISTAL MARGIN DEMONSTRATES ADENOCARCINOMA INVOLVING THE LYMPH/VASCULAR SPACES. - PROXIMAL MARGIN IS NEGATIVE. - ELEVEN OF TWELVE LYMPH NODES ARE POSITIVE FOR METASTATIC ADENOCARCINOMA (11/12). - EXTRACAPSULAR EXTENSION IS PRESENT. 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  Unfortunately he is at high risk for recurrence and I strongly emphasized the need for adjuvant therapy. I would also recommend potential referral near the completion of therapy for HIPEC evaluation at [facility]. Heith agreed to try FOLFOX after he was intolerant of  XELOX. He is here today for ongoing therapy.  He has struggled through chemotherapy. Initially secondary to nausea/vomiting. This has improved. Weight loss was initially significant. He has about 30 lbs in the last two months. His weight has essentially stabilized in the last two weeks.  He is clearly depressed. We discussed this today. He admits that he has lost a lot of interest to do things. He is on lexapro. I have added wellbutrin in the am. He will take today off therapy.  I again addressed HIPEC. He listened. We will address again at follow-up.   Requesting another case of [place]ecan Ensure from [facility].  He will return for follow up and reassessment in 3 weeks.   All questions were answered. The patient knows to call the clinic with any problems, questions or concerns. We can certainly see the patient much sooner if necessary.  This document serves as a record of services personally performed by Ancil Linsey, MD. It was created on her behalf by Arlyce Harman, a trained medical scribe. The creation of this record is based on the scribe's personal observations and the provider's statements to them. This document has been checked and approved by the attending provider.  I have reviewed the above documentation for accuracy and completeness, and I agree with the above.  This note is electronically signed by: Molli Hazard,  MD  03/13/2016 10:33 AM

## 2016-03-02 NOTE — Progress Notes (Signed)
Derek Rodriguez tolerated chemo tx well without incident. Labs reviewed with Dr. Whitney Muse prior to chemo administration. Pt discharged with 5FU pump infusing without issues. VSS upon discharge. Pt discharged via wheelchair in stable condition with wife

## 2016-03-03 LAB — TESTOSTERONE, FREE: TESTOSTERONE FREE: 2.7 pg/mL — AB (ref 7.2–24.0)

## 2016-03-03 LAB — TESTOSTERONE: Testosterone: 120 ng/dL — ABNORMAL LOW (ref 264–916)

## 2016-03-05 ENCOUNTER — Ambulatory Visit (HOSPITAL_COMMUNITY): Payer: BLUE CROSS/BLUE SHIELD

## 2016-03-06 ENCOUNTER — Encounter (HOSPITAL_COMMUNITY): Payer: Self-pay

## 2016-03-06 ENCOUNTER — Encounter (HOSPITAL_BASED_OUTPATIENT_CLINIC_OR_DEPARTMENT_OTHER): Payer: BLUE CROSS/BLUE SHIELD

## 2016-03-06 VITALS — BP 117/79 | HR 89 | Temp 98.2°F | Resp 18

## 2016-03-06 DIAGNOSIS — C187 Malignant neoplasm of sigmoid colon: Secondary | ICD-10-CM | POA: Diagnosis not present

## 2016-03-06 DIAGNOSIS — Z5189 Encounter for other specified aftercare: Secondary | ICD-10-CM

## 2016-03-06 DIAGNOSIS — C7989 Secondary malignant neoplasm of other specified sites: Secondary | ICD-10-CM | POA: Diagnosis not present

## 2016-03-06 MED ORDER — PEGFILGRASTIM INJECTION 6 MG/0.6ML ~~LOC~~
6.0000 mg | PREFILLED_SYRINGE | Freq: Once | SUBCUTANEOUS | Status: AC
  Administered 2016-03-06: 6 mg via SUBCUTANEOUS

## 2016-03-06 NOTE — Progress Notes (Signed)
Copper Scherff presents today for injection per the provider's orders.  Neulasta administration without incident; see MAR for injection details.  Patient tolerated procedure well and without incident.  No questions or complaints noted at this time.

## 2016-03-06 NOTE — Patient Instructions (Signed)
[place] at [facility] Discharge Instructions  RECOMMENDATIONS MADE BY THE CONSULTANT AND ANY TEST RESULTS WILL BE SENT TO YOUR REFERRING PHYSICIAN.  Neulasta injection today. Return as scheduled for chemotherapy and office visit.   Thank you for choosing [place] at [facility] to provide your oncology and hematology care.  To afford each patient quality time with our provider, please arrive at least 15 minutes before your scheduled appointment time.   Beginning January 23rd 2017 lab work for the [facility] will be done in the  Main lab at [facility] on 1st floor. If you have a lab appointment with the [facility] please come in thru the  Main Entrance and check in at the main information desk  You need to re-schedule your appointment should you arrive 10 or more minutes late.  We strive to give you quality time with our providers, and arriving late affects you and other patients whose appointments are after yours.  Also, if you no show three or more times for appointments you may be dismissed from the clinic at the providers discretion.     Again, thank you for choosing [facility].  Our hope is that these requests will decrease the amount of time that you wait before being seen by our physicians.       _____________________________________________________________  Should you have questions after your visit to [facility], please contact our office at [phone] between the hours of 8:30 a.m. and 4:30 p.m.  Voicemails left after 4:30 p.m. will not be returned until the following business day.  For prescription refill requests, have your pharmacy contact our office.         Resources For Cancer Patients and their Caregivers ? American Cancer Society: Can assist with transportation, wigs, general needs, runs Look Good Feel Better.        (504)698-5519 ? Cancer Care: Provides financial assistance,  online support groups, medication/co-pay assistance.  1-800-813-HOPE 754 559 4374) ? [place] Assists [place] cancer patients and their families through emotional , educational and financial support.  (559) 758-5931 ? [place] DSS Where to apply for food stamps, Medicaid and utility assistance. 508-141-1860 ? [facility]: Transportation to medical appointments. 8038792860 ? Social Security Administration: May apply for disability if have a Stage IV cancer. (213) 020-1702 909 511 6017 ? [employer], Disability and Transit Services: Assists with nutrition, care and transit needs. [place] Support Programs: @10RELATIVEDAYS @ > Cancer Support Group  2nd Tuesday of the month 1pm-2pm, Journey Room  > Creative Journey  3rd Tuesday of the month 1130am-1pm, Journey Room  > Look Good Feel Better  1st Wednesday of the month 10am-12 noon, Journey Room (Call [place] to register [phone])

## 2016-03-07 ENCOUNTER — Telehealth: Payer: Self-pay | Admitting: Dietician

## 2016-03-07 NOTE — Telephone Encounter (Signed)
RN contacted RD about pt getting another case of Ensure.   Contacted Pt by phone   Wt Readings from Last 10 Encounters:  03/02/16 200 lb 8 oz (90.9 kg)  02/21/16 197 lb (89.4 kg)  02/17/16 195 lb 6.4 oz (88.6 kg)  02/06/16 203 lb (92.1 kg)  02/06/16 203 lb (92.1 kg)  02/03/16 205 lb 9.6 oz (93.3 kg)  01/30/16 212 lb (96.2 kg)  01/27/16 215 lb (97.5 kg)  01/13/16 227 lb 6.4 oz (103.1 kg)  01/06/16 255 lb (115.7 kg)   Patient weight has stabilized x 1 month. He is still down 30-35 lbs in the last few months.  Spoke with pt's wife. She reports patient is subsisting solely on Ensure and Boost. He does not eat or drink anything else. He is drinking 4 of these each day (1400 kcals, 52 g Pro). This does not seem as it is providing sufficient calories for wt maintenance, but she actually reports a "2 lb gain" solely from the Ensure.  Wife states pt is tolerating chemo well. She says he is not having any chemo related side effects at the moment. She does report some diarrhea, but says this may because the pt ate a piece of an oatmeal cookie.   RD will order another case. Instructed that RD will teach how to make homemade Ensure on next visit.   RD stated he would call when Ensure arrives; they live >40 minutes away.   Burtis Junes RD, LDN, [facility] Pager: [phone] 03/07/2016 3:15 PM

## 2016-03-08 ENCOUNTER — Encounter: Payer: Self-pay | Admitting: Gastroenterology

## 2016-03-08 ENCOUNTER — Other Ambulatory Visit (HOSPITAL_COMMUNITY): Payer: Self-pay | Admitting: Hematology & Oncology

## 2016-03-08 DIAGNOSIS — F329 Major depressive disorder, single episode, unspecified: Secondary | ICD-10-CM

## 2016-03-08 DIAGNOSIS — F32A Depression, unspecified: Secondary | ICD-10-CM

## 2016-03-09 ENCOUNTER — Inpatient Hospital Stay (HOSPITAL_COMMUNITY): Payer: BLUE CROSS/BLUE SHIELD

## 2016-03-09 ENCOUNTER — Ambulatory Visit (HOSPITAL_COMMUNITY): Payer: BLUE CROSS/BLUE SHIELD | Admitting: Hematology & Oncology

## 2016-03-13 ENCOUNTER — Encounter (HOSPITAL_COMMUNITY): Payer: Self-pay | Admitting: Hematology & Oncology

## 2016-03-15 ENCOUNTER — Ambulatory Visit: Payer: BLUE CROSS/BLUE SHIELD | Admitting: Gastroenterology

## 2016-03-16 ENCOUNTER — Ambulatory Visit (HOSPITAL_COMMUNITY): Payer: BLUE CROSS/BLUE SHIELD

## 2016-03-16 ENCOUNTER — Ambulatory Visit (HOSPITAL_COMMUNITY): Payer: BLUE CROSS/BLUE SHIELD | Admitting: Hematology & Oncology

## 2016-03-16 ENCOUNTER — Inpatient Hospital Stay (HOSPITAL_COMMUNITY): Payer: BLUE CROSS/BLUE SHIELD

## 2016-03-19 ENCOUNTER — Ambulatory Visit (HOSPITAL_COMMUNITY): Payer: BLUE CROSS/BLUE SHIELD

## 2016-03-23 ENCOUNTER — Encounter (HOSPITAL_COMMUNITY): Payer: Self-pay

## 2016-03-23 ENCOUNTER — Encounter (HOSPITAL_BASED_OUTPATIENT_CLINIC_OR_DEPARTMENT_OTHER): Payer: BLUE CROSS/BLUE SHIELD

## 2016-03-23 ENCOUNTER — Encounter: Payer: Self-pay | Admitting: Dietician

## 2016-03-23 ENCOUNTER — Ambulatory Visit (HOSPITAL_COMMUNITY): Payer: BLUE CROSS/BLUE SHIELD

## 2016-03-23 ENCOUNTER — Encounter (HOSPITAL_COMMUNITY): Payer: BLUE CROSS/BLUE SHIELD | Attending: Oncology | Admitting: Hematology & Oncology

## 2016-03-23 VITALS — BP 122/78 | HR 70 | Temp 98.2°F | Resp 16 | Wt 191.6 lb

## 2016-03-23 DIAGNOSIS — I10 Essential (primary) hypertension: Secondary | ICD-10-CM | POA: Diagnosis not present

## 2016-03-23 DIAGNOSIS — G4700 Insomnia, unspecified: Secondary | ICD-10-CM | POA: Diagnosis not present

## 2016-03-23 DIAGNOSIS — C7989 Secondary malignant neoplasm of other specified sites: Secondary | ICD-10-CM | POA: Diagnosis not present

## 2016-03-23 DIAGNOSIS — R634 Abnormal weight loss: Secondary | ICD-10-CM | POA: Diagnosis not present

## 2016-03-23 DIAGNOSIS — Z87891 Personal history of nicotine dependence: Secondary | ICD-10-CM | POA: Insufficient documentation

## 2016-03-23 DIAGNOSIS — Z833 Family history of diabetes mellitus: Secondary | ICD-10-CM | POA: Diagnosis not present

## 2016-03-23 DIAGNOSIS — D649 Anemia, unspecified: Secondary | ICD-10-CM | POA: Diagnosis not present

## 2016-03-23 DIAGNOSIS — Z9889 Other specified postprocedural states: Secondary | ICD-10-CM | POA: Diagnosis not present

## 2016-03-23 DIAGNOSIS — C187 Malignant neoplasm of sigmoid colon: Secondary | ICD-10-CM | POA: Diagnosis not present

## 2016-03-23 DIAGNOSIS — Z5111 Encounter for antineoplastic chemotherapy: Secondary | ICD-10-CM

## 2016-03-23 DIAGNOSIS — M1990 Unspecified osteoarthritis, unspecified site: Secondary | ICD-10-CM | POA: Insufficient documentation

## 2016-03-23 DIAGNOSIS — Z95828 Presence of other vascular implants and grafts: Secondary | ICD-10-CM

## 2016-03-23 DIAGNOSIS — F329 Major depressive disorder, single episode, unspecified: Secondary | ICD-10-CM

## 2016-03-23 DIAGNOSIS — Z79899 Other long term (current) drug therapy: Secondary | ICD-10-CM | POA: Insufficient documentation

## 2016-03-23 DIAGNOSIS — K5792 Diverticulitis of intestine, part unspecified, without perforation or abscess without bleeding: Secondary | ICD-10-CM | POA: Diagnosis not present

## 2016-03-23 LAB — COMPREHENSIVE METABOLIC PANEL
ALBUMIN: 3.7 g/dL (ref 3.5–5.0)
ALT: 35 U/L (ref 17–63)
ANION GAP: 10 (ref 5–15)
AST: 41 U/L (ref 15–41)
Alkaline Phosphatase: 85 U/L (ref 38–126)
BUN: 10 mg/dL (ref 6–20)
CALCIUM: 9.2 mg/dL (ref 8.9–10.3)
CHLORIDE: 100 mmol/L — AB (ref 101–111)
CO2: 26 mmol/L (ref 22–32)
CREATININE: 1.01 mg/dL (ref 0.61–1.24)
GFR calc Af Amer: >60 mL/min (ref >60)
GFR calc non Af Amer: >60 mL/min (ref >60)
GLUCOSE: 150 mg/dL — AB (ref 65–99)
POTASSIUM: 3.3 mmol/L — AB (ref 3.5–5.1)
SODIUM: 136 mmol/L (ref 135–145)
TOTAL PROTEIN: 6.9 g/dL (ref 6.5–8.1)
Total Bilirubin: 0.9 mg/dL (ref 0.3–1.2)

## 2016-03-23 LAB — CBC WITH DIFFERENTIAL/PLATELET
BASOS PCT: 1 %
Basophils Absolute: 0 10*3/uL (ref 0.0–0.1)
EOS ABS: 0.1 10*3/uL (ref 0.0–0.7)
Eosinophils Relative: 2 %
HCT: 43.6 % (ref 39.0–52.0)
HEMOGLOBIN: 14.8 g/dL (ref 13.0–17.0)
Lymphocytes Relative: 25 %
Lymphs Abs: 1.6 10*3/uL (ref 0.7–4.0)
MCH: 31.6 pg (ref 26.0–34.0)
MCHC: 33.9 g/dL (ref 30.0–36.0)
MCV: 93.2 fL (ref 78.0–100.0)
MONO ABS: 0.7 10*3/uL (ref 0.1–1.0)
MONOS PCT: 11 %
NEUTROS PCT: 61 %
Neutro Abs: 4 10*3/uL (ref 1.7–7.7)
PLATELETS: 235 10*3/uL (ref 150–400)
RBC: 4.68 MIL/uL (ref 4.22–5.81)
RDW: 18.1 % — AB (ref 11.5–15.5)
WBC: 6.4 10*3/uL (ref 4.0–10.5)

## 2016-03-23 MED ORDER — CLONAZEPAM 1 MG PO TABS: 1.0000 mg | ORAL_TABLET | Freq: Every day | ORAL | 0 refills | Status: DC

## 2016-03-23 MED ORDER — PALONOSETRON HCL INJECTION 0.25 MG/5ML
0.2500 mg | Freq: Once | INTRAVENOUS | Status: AC
  Administered 2016-03-23: 0.25 mg via INTRAVENOUS
  Filled 2016-03-23: qty 5

## 2016-03-23 MED ORDER — OXALIPLATIN CHEMO INJECTION 100 MG/20ML
68.0000 mg/m2 | Freq: Once | INTRAVENOUS | Status: AC
  Administered 2016-03-23: 155 mg via INTRAVENOUS
  Filled 2016-03-23: qty 31

## 2016-03-23 MED ORDER — LORAZEPAM 2 MG/ML IJ SOLN
0.5000 mg | Freq: Once | INTRAMUSCULAR | Status: AC
  Administered 2016-03-23: 0.5 mg via INTRAVENOUS
  Filled 2016-03-23: qty 1

## 2016-03-23 MED ORDER — FLUOROURACIL CHEMO INJECTION 5 GM/100ML
2400.0000 mg/m2 | INTRAVENOUS | Status: DC
  Administered 2016-03-23: 5500 mg via INTRAVENOUS
  Filled 2016-03-23: qty 110

## 2016-03-23 MED ORDER — SODIUM CHLORIDE 0.9 % IV SOLN
Freq: Once | INTRAVENOUS | Status: AC
  Administered 2016-03-23: 10:00:00 via INTRAVENOUS
  Filled 2016-03-23: qty 5

## 2016-03-23 MED ORDER — LEUCOVORIN CALCIUM INJECTION 350 MG
400.0000 mg/m2 | Freq: Once | INTRAMUSCULAR | Status: AC
  Administered 2016-03-23: 920 mg via INTRAVENOUS
  Filled 2016-03-23: qty 46

## 2016-03-23 MED ORDER — DEXTROSE 5 % IV SOLN
Freq: Once | INTRAVENOUS | Status: AC
  Administered 2016-03-23: 10:00:00 via INTRAVENOUS

## 2016-03-23 NOTE — Patient Instructions (Signed)
[facility] Discharge Instructions for Patients Receiving Chemotherapy   Beginning January 23rd 2017 lab work for the [facility] will be done in the  Main lab at [facility] on 1st floor. If you have a lab appointment with the [facility] please come in thru the  Main Entrance and check in at the main information desk   Today you received the following chemotherapy agents Oxaliplatin, Leucovorin, 5FU pump  To help prevent nausea and vomiting after your treatment, we encourage you to take your nausea medication     If you develop nausea and vomiting, or diarrhea that is not controlled by your medication, call the clinic.  The clinic phone number is [phone]. Office hours are Monday-Friday 8:30am-5:00pm.  BELOW ARE SYMPTOMS THAT SHOULD BE REPORTED IMMEDIATELY:  *FEVER GREATER THAN 101.0 F  *CHILLS WITH OR WITHOUT FEVER  NAUSEA AND VOMITING THAT IS NOT CONTROLLED WITH YOUR NAUSEA MEDICATION  *UNUSUAL SHORTNESS OF BREATH  *UNUSUAL BRUISING OR BLEEDING  TENDERNESS IN MOUTH AND THROAT WITH OR WITHOUT PRESENCE OF ULCERS  *URINARY PROBLEMS  *BOWEL PROBLEMS  UNUSUAL RASH Items with * indicate a potential emergency and should be followed up as soon as possible. If you have an emergency after office hours please contact your primary care physician or go to the nearest emergency department.  Please call the clinic during office hours if you have any questions or concerns.   You may also contact the Patient Navigator at [phone] should you have any questions or need assistance in obtaining follow up care.      Resources For Cancer Patients and their Caregivers ? American Cancer Society: Can assist with transportation, wigs, general needs, runs Look Good Feel Better.        671-565-3156 ? Cancer Care: Provides financial assistance, online support groups, medication/co-pay assistance.  1-800-813-HOPE 534-146-4982) ? [place] Assists [place] cancer patients and their families through emotional , educational and financial support.  517-724-2710 ? [place] DSS Where to apply for food stamps, Medicaid and utility assistance. (386) 255-7651 ? [employer]: Transportation to medical appointments. 516-509-2923 ? Social Security Administration: May apply for disability if have a Stage IV cancer. 3151194576 (769)022-5205 ? [employer], Disability and Transit Services: Assists with nutrition, care and transit needs. [phone]

## 2016-03-23 NOTE — Patient Instructions (Signed)
[place] at [facility] Discharge Instructions  RECOMMENDATIONS MADE BY THE CONSULTANT AND ANY TEST RESULTS WILL BE SENT TO YOUR REFERRING PHYSICIAN.   We really want you referred to [facility] regarding education of the HIPEC procedure  EAT, EAT, EAT whatever you want. Just EAT.  Ovid Curd the dietitian to see you  All of your follow up appts need to be with Dr. Whitney Muse - and she will see you every 2 weeks  CT abdomen before next chemo    Thank you for choosing [employer] at [facility] to provide your oncology and hematology care.  To afford each patient quality time with our provider, please arrive at least 15 minutes before your scheduled appointment time.   Beginning January 23rd 2017 lab work for the [facility] will be done in the  Main lab at [facility] on 1st floor. If you have a lab appointment with the [facility] please come in thru the  Main Entrance and check in at the main information desk  You need to re-schedule your appointment should you arrive 10 or more minutes late.  We strive to give you quality time with our providers, and arriving late affects you and other patients whose appointments are after yours.  Also, if you no show three or more times for appointments you may be dismissed from the clinic at the providers discretion.     Again, thank you for choosing [facility].  Our hope is that these requests will decrease the amount of time that you wait before being seen by our physicians.       _____________________________________________________________  Should you have questions after your visit to [facility], please contact our office at [phone] between the hours of 8:30 a.m. and 4:30 p.m.  Voicemails left after 4:30 p.m. will not be returned until the following business day.  For prescription refill requests, have your pharmacy contact our office.         Resources For Cancer  Patients and their Caregivers ? American Cancer Society: Can assist with transportation, wigs, general needs, runs Look Good Feel Better.        980-858-1693 ? Cancer Care: Provides financial assistance, online support groups, medication/co-pay assistance.  1-800-813-HOPE 270-567-8072) ? [place] Assists [place] cancer patients and their families through emotional , educational and financial support.  617-225-9478 ? [place] DSS Where to apply for food stamps, Medicaid and utility assistance. (440) 279-9204 ? [employer]: Transportation to medical appointments. (989)078-4043 ? Social Security Administration: May apply for disability if have a Stage IV cancer. (561) 120-3420 (323)731-3273 ? [employer], Disability and Transit Services: Assists with nutrition, care and transit needs. [place] Support Programs: @10RELATIVEDAYS @ > Cancer Support Group  2nd Tuesday of the month 1pm-2pm, Journey Room  > Creative Journey  3rd Tuesday of the month 1130am-1pm, Journey Room  > Look Good Feel Better  1st Wednesday of the month 10am-12 noon, Journey Room (Call [place] to register [phone])

## 2016-03-23 NOTE — Progress Notes (Signed)
Follow up with pt who has subjective poor diet tolerance and wt loss.    Contacted Pt by visiting during infusion.   Wt Readings from Last 10 Encounters:  03/23/16 191 lb 9.6 oz (86.9 kg)  03/02/16 200 lb 8 oz (90.9 kg)  02/21/16 197 lb (89.4 kg)  02/17/16 195 lb 6.4 oz (88.6 kg)  02/06/16 203 lb (92.1 kg)  02/06/16 203 lb (92.1 kg)  02/03/16 205 lb 9.6 oz (93.3 kg)  01/30/16 212 lb (96.2 kg)  01/27/16 215 lb (97.5 kg)  01/13/16 227 lb 6.4 oz (103.1 kg)   Patients weight has decreased by ~4 lbs in the last month.   Patient reports oral intake as still consisting of only Vanilla Ensure. He had been drinking 4 of these a day. Wife attributes his weight loss to him only drinking 3 per day recently. When asked if he had been feeling worse or had any new n/v/c/d he said "No". He says "it is hard to just drink something each day". He sounds to have developed taste fatigue which is very understandable.  RD again asked him to try to eat/drink as there is no known reason why he is able to tolerate only Vanilla Ensure. He is told there is nothing magical about Ensure and he responds "It is magical to me". He remains largely reluctant to try other food and drinks.   Wife reports that they have received additional supplement support from the [facility]. They are unsure how much they are able to receive from them. At [facility] they have received 2 out of 3 of their eligible cases.    RD gave another alternative of making his own Ensure, also known as fortified milk. Gave Recipe. This will provide 211 kcals, 14 g Pro.   Pt seemed to be anxious, he was fidgeting and playing with his tray table during our conversation. His responses were almost always 1 word. As such, most of conversation was had with wife.   Left handouts titled "Making the Most of Each Bite". Will continue to support with additional 1 case of ensure as well as samples and coupons  Burtis Junes RD, LDN, [place] [employer] Pager:  [phone] 03/23/2016 12:25 PM

## 2016-03-23 NOTE — Progress Notes (Signed)
[facility] Hematology/Oncology Progress Note  Name: Derek Rodriguez      MRN: [id number]     Date: 03/23/2016 Time:9:32 AM   REFERRING PHYSICIAN:  Aviva Signs, MD (Gen Surg)  REASON FOR CONSULT:  Adenocarcinoma of sigmoid colon.   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    Adenocarcinoma of sigmoid colon ([place])   09/09/2015 - 09/12/2015 Hospital Admission    Diverticulitis of colon with perforation      09/12/2015 Imaging    CT abd/pelvis- Continued severe inflammation of the proximal half of the sigmoid colon. There is a 2.6 cm oval low-attenuation structure extending between the posterior wall of the inflamed sigmoid colon and the dome of the bladder....      09/27/2015 Tumor Marker    CEA: 2.5       10/03/2015 Procedure    Partial colectomy, splenic flexure takedown by Dr. Arnoldo Morale      10/03/2015 Pathology Results    FULL THICKNESS INVASIVE MODERATELY TO POORLY DIFFERENTIATED ADENOCARCINOMA WITH SURFACE ULCERATION. TUMOR INVADES THROUGH MUSCULARIS MUCOSA AND PERFORATES SEROSAL SURFACE. - TUMOR MEASURES 10.3 CM IN GREATEST DIMENSION. - EXTENSIVE LYMPH/VASCULAR INVASI      10/13/2015 Pathology Results    MSI STABLE      10/20/2015 Imaging    CT chest- Negative. No evidence of metastatic disease or other active disease within the thorax.      11/04/2015 - 11/04/2015 Chemotherapy    CapeOX      11/12/2015 Miscellaneous    ED visit with nausea and decreased po intake      11/18/2015 Procedure    Port-A-Cath insertion by Dr. Arnoldo Morale      11/25/2015 Treatment Plan Change    From Xelox to FOLFOX      11/25/2015 -  Chemotherapy    FOLFOX      01/06/2016 Treatment Plan Change    Oxaliplatin dose reduced by 20% for cycle #4 and 5FU bolus was discontinued.        HISTORY OF PRESENT ILLNESS:   Derek Rodriguez is a 55 year old white American man with a past medical history significant for diverticulitis and HTN who returns for follow-up of  Stage IIIC ([id number]) adenocarcinoma of sigmoid colon cancer, S/P partial colectomy by Dr. Arnoldo Morale.  Derek Rodriguez is accompanied by his wife and presents in treatment bed. He returns for Cycle #8 FOLFOX. I personally reviewed and went over laboratory studies with the patient.  He has been drinking his Boost. He does not eat much else, "I just can't eat - I can't explain it. I can't eat nothing. It doesn't want to go down". When food does go down, he feels like he's getting choked. When he was eating he would vomit it back up. He states he has not lost much weight. He was previously drinking 4 cans Boost per day and has cut back to 3 cans per day. EGD was done on 02/06/2016 by Dr. Gala Romney noting esophagitis.   His wife remarks he will eat foods occasionally then not eat for several days. She has been trying different foods and he will sometimes eat 3 or 4 bites.   He has been better about getting out of bed. He gets up in the mornings and goes outside. His wife agrees he has been getting up every day for a few hours. He no longer stays in bed all day. He is up each morning.   He has been having  difficulty sleeping. He will take [place] and just lay there awake. He would like something to help him sleep better. He is now taking wellbutrin and lexapro.   His wife states his mood has been better. He has been watching TV where previously he wouldn't do anything. The patient agrees that he has been doing better.    Compared to his last visit, he feels okay. He would like to eat like everyone else but it just won't go down. He still feels like life is good, "I don't live in [place]. I got a place to stay. I got a TV. I got a bed". His relationship with his wife is good.   He denies abdominal pain, chest pain, headaches, or breathing trouble.  Review of Systems  Constitutional: Positive for malaise/fatigue and weight loss (9 lbs since 03/02/16). Negative for chills, diaphoresis and fever.  HENT: Negative.   Negative for congestion and nosebleeds.   Eyes: Negative.   Respiratory: Negative.  Negative for cough, hemoptysis and sputum production.   Cardiovascular: Negative.  Negative for chest pain and palpitations.  Gastrointestinal: Positive for vomiting. Negative for abdominal pain, blood in stool, diarrhea, heartburn and melena.       Vomiting with oral food intake  Genitourinary: Negative.   Musculoskeletal: Negative for myalgias and neck pain.  Skin: Negative.  Negative for rash.  Neurological: Positive for weakness. Negative for headaches.  Endo/Heme/Allergies: Negative.   Psychiatric/Behavioral: The patient is nervous/anxious.   14 point review of systems was performed and is negative except as detailed under history of present illness and above   PAST MEDICAL HISTORY:   Past Medical History:  Diagnosis Date  . Adenocarcinoma of sigmoid colon ([place]) 10/18/2015  . Arthritis   . Diverticulitis   . Hypertension     ALLERGIES: No Known Allergies    MEDICATIONS: I have reviewed the patient's current medications.    Current Outpatient Prescriptions on File Prior to Visit  Medication Sig Dispense Refill  . buPROPion (WELLBUTRIN XL) 150 MG 24 hr tablet Take 1 tablet (150 mg total) by mouth daily. 30 tablet 3  . CARAFATE 1 GM/10ML suspension TAKE 10MLS (2 TEASPOONFULS) BY MOUTH 4 TIMES A DAY FOR 7 DAYS  0  . escitalopram (LEXAPRO) 20 MG tablet FOR THE FIRST 3 DAYS TAKE 1/2 TABLET. THEN TAKE 1 TABLET DAILY THEREAFTER. 30 tablet 1  . granisetron (SANCUSO) 3.1 MG/24HR Apply to skin starting 24 hours before chemotherapy. Remove after 7 days. 1 each 5  . Heparin Lock Flush (HEPARIN FLUSH, PORCINE,) 100 UNIT/ML injection Flush port with 5cc of heparin lock flush. 10 Syringe 0  . ibuprofen (ADVIL,MOTRIN) 200 MG tablet Take 400 mg by mouth every 6 (six) hours as needed for mild pain or moderate pain.    Marland Kitchen lidocaine-prilocaine (EMLA) cream Apply a quarter size amount to port site 1 hour prior to  chemo. Do not rub in. Cover with plastic wrap. 30 g 3  . LORazepam (ATIVAN) 0.5 MG tablet Take 1 tablet (0.5 mg total) by mouth every 6 (six) hours as needed for anxiety. 120 tablet 0  . omeprazole (PRILOSEC) 40 MG capsule Take 1 capsule (40 mg total) by mouth daily. 30 capsule 6  . ondansetron (ZOFRAN) 8 MG tablet Take 1 tablet (8 mg total) by mouth every 8 (eight) hours as needed for nausea or vomiting. 30 tablet 2  . OXALIPLATIN IV Inject into the vein. (chemo) To be given once every 14 days    . oxyCODONE-acetaminophen (  PERCOCET) 7.5-325 MG tablet Take 1-2 tablets by mouth every 4 (four) hours as needed for moderate pain. 90 tablet 0  . potassium chloride SA (K-DUR,KLOR-CON) 20 MEQ tablet Take 2 tablets (40 mEq total) by mouth 2 (two) times daily. 60 tablet 0  . prochlorperazine (COMPAZINE) 10 MG tablet Take 1 tablet (10 mg total) by mouth every 6 (six) hours as needed for nausea or vomiting. 90 tablet 6  . promethazine (PHENERGAN) 25 MG suppository Place 1 suppository (25 mg total) rectally every 6 (six) hours as needed for nausea or vomiting. 6 each 0  . Sodium Chloride Flush (NORMAL SALINE FLUSH) 0.9 % SOLN Flush port with 10 ml of normal saline. 10 Syringe 0  . zolpidem (AMBIEN) 10 MG tablet Take 1 tablet (10 mg total) by mouth at bedtime as needed for sleep. 30 tablet 3   No current facility-administered medications on file prior to visit.      PAST SURGICAL HISTORY Past Surgical History:  Procedure Laterality Date  . BIOPSY  02/06/2016   Procedure: BIOPSY;  Surgeon: Daneil Dolin, MD;  [facility];  Service: Endoscopy;;  esophagus  . ESOPHAGEAL DILATION  02/06/2016   Procedure: ESOPHAGEAL DILATION;  Surgeon: Daneil Dolin, MD;  Location: [facility];  Service: Endoscopy;;  . ESOPHAGOGASTRODUODENOSCOPY N/A 02/06/2016   Procedure: ESOPHAGOGASTRODUODENOSCOPY (EGD);  Surgeon: Daneil Dolin, MD;  Location: [facility];  Service: Endoscopy;  Laterality: N/A;  115  .  PARTIAL COLECTOMY N/A 10/03/2015   Procedure: PARTIAL COLECTOMY;  Surgeon: Aviva Signs, MD;  Location: [facility];  Service: General;  Laterality: N/A;  . PORTACATH PLACEMENT N/A 11/18/2015   Procedure: INSERTION PORT-A-CATH LEFT SUBCLAVIAN;  Surgeon: Aviva Signs, MD;  Location: [facility];  Service: General;  Laterality: N/A;    FAMILY HISTORY: Family History  Problem Relation Age of Onset  . Diabetes Father    No known family history of cancer Mother alive at age 81- healthy Father deceased at age of 55 from heart disease, complications of DM 65 son 54 years old- healthy 10 grandson 66 year old- healthy  SOCIAL HISTORY:  reports that he quit smoking about 30 years ago. His smoking use included Cigars and Pipe. He quit smokeless tobacco use about 8 months ago. His smokeless tobacco use included Snuff. He reports that he does not drink alcohol or use drugs.  He works at [employer].  He is married x 37 years.  He is [employer].  PERFORMANCE STATUS: The patient's performance status is 0 - Asymptomatic  PHYSICAL EXAM: Most Recent Vital Signs:  Vitals with BMI 03/23/2016  Height   Weight 191 lbs 10 oz  BMI   Systolic 329  Diastolic 92  Pulse 924  Respirations 18    Physical Exam  Constitutional: He is oriented to person, place, and time. No distress.  HENT:  Head: Normocephalic and atraumatic.  Mouth/Throat: Oropharynx is clear and moist.  Eyes: Conjunctivae and EOM are normal. Pupils are equal, round, and reactive to light. Right eye exhibits no discharge. Left eye exhibits no discharge.  Neck: Normal range of motion. Neck supple. No thyromegaly present.  Cardiovascular: Normal rate, regular rhythm and normal heart sounds.   Pulmonary/Chest: Effort normal and breath sounds normal. No respiratory distress. He has no wheezes. He has no rales. He exhibits no tenderness.  Abdominal: Soft. Bowel sounds are normal. He exhibits no distension and no mass. There is no tenderness. There is no rebound  and no guarding. No hernia.  Musculoskeletal: Normal range of motion. He exhibits no edema or deformity.  Neurological: He is alert and oriented to person, place, and time.  Skin: Skin is warm and dry.  Nursing note and vitals reviewed.   LABORATORY DATA:  I have reviewed the data as listed. Results for LEARY, MCNULTY (MRN [id number])   Ref. Range 03/23/2016 08:47  Sodium Latest Ref Range: 135 - 145 mmol/L 136  Potassium Latest Ref Range: 3.5 - 5.1 mmol/L 3.3 (L)  Chloride Latest Ref Range: 101 - 111 mmol/L 100 (L)  CO2 Latest Ref Range: 22 - 32 mmol/L 26  BUN Latest Ref Range: 6 - 20 mg/dL 10  Creatinine Latest Ref Range: 0.61 - 1.24 mg/dL 1.01  Calcium Latest Ref Range: 8.9 - 10.3 mg/dL 9.2  EGFR (Non-African Amer.) Latest Ref Range: >60 mL/min >60  EGFR (African American) Latest Ref Range: >60 mL/min >60  Glucose Latest Ref Range: 65 - 99 mg/dL 150 (H)  Anion gap Latest Ref Range: 5 - 15  10  Alkaline Phosphatase Latest Ref Range: 38 - 126 U/L 85  Albumin Latest Ref Range: 3.5 - 5.0 g/dL 3.7  AST Latest Ref Range: 15 - 41 U/L 41  ALT Latest Ref Range: 17 - 63 U/L 35  Total Protein Latest Ref Range: 6.5 - 8.1 g/dL 6.9  Total Bilirubin Latest Ref Range: 0.3 - 1.2 mg/dL 0.9  WBC Latest Ref Range: 4.0 - 10.5 K/uL 6.4  RBC Latest Ref Range: 4.22 - 5.81 MIL/uL 4.68  Hemoglobin Latest Ref Range: 13.0 - 17.0 g/dL 14.8  HCT Latest Ref Range: 39.0 - 52.0 % 43.6  MCV Latest Ref Range: 78.0 - 100.0 fL 93.2  MCH Latest Ref Range: 26.0 - 34.0 pg 31.6  MCHC Latest Ref Range: 30.0 - 36.0 g/dL 33.9  RDW Latest Ref Range: 11.5 - 15.5 % 18.1 (H)  Platelets Latest Ref Range: 150 - 400 K/uL 235  Neutrophils Latest Units: % 61  Lymphocytes Latest Units: % 25  Monocytes Relative Latest Units: % 11  Eosinophil Latest Units: % 2  Basophil Latest Units: % 1  NEUT# Latest Ref Range: 1.7 - 7.7 K/uL 4.0  Lymphocyte # Latest Ref Range: 0.7 - 4.0 K/uL 1.6  Monocyte # Latest Ref Range: 0.1 - 1.0 K/uL  0.7  Eosinophils Absolute Latest Ref Range: 0.0 - 0.7 K/uL 0.1  Basophils Absolute Latest Ref Range: 0.0 - 0.1 K/uL 0.0    Lab Results  Component Value Date   CEA 2.5 09/27/2015     RADIOGRAPHY: I have personally reviewed the radiological images as listed and agreed with the findings in the report. Study Result   CLINICAL DATA:  New cough.  Colon carcinoma.  EXAM: NECK SOFT TISSUES - 1+ VIEW  COMPARISON:  None.  FINDINGS: Frontal and lateral views were obtained. The epiglottis and aryepiglottic folds appear normal. Prevertebral soft tissues are normal. No air-fluid level to suggest abscess. There is extensive degenerative change in the mid cervical spine. Tongue base region appears normal. Visualized tracheal air column appears normal.  IMPRESSION: Multilevel degenerative change in the cervical spine. No soft tissue lesion. Epiglottis and aryepiglottic folds appear normal. Visualized tracheal air column appears normal.   Electronically Signed   By: Lowella Grip III M.D.   On: 01/30/2016 17:37   Study Result   CLINICAL DATA:  Nonproductive cough and shortness of breath, several weeks duration. Symptoms worsening today.  EXAM: CHEST  2 VIEW  COMPARISON:  11/18/2015  FINDINGS: Heart size is normal.  Mediastinal shadows are normal. Power port inserted from a left subclavian approach has its tip in the SVC at the azygos level. The lungs are clear. The vascularity is normal. No effusions. No bone abnormality.  IMPRESSION: No active disease.   Electronically Signed   By: Nelson Chimes M.D.   On: 01/30/2016 16:07     PATHOLOGY:    Diagnosis Colon, segmental resection, sigmoid - FULL THICKNESS INVASIVE MODERATELY TO POORLY DIFFERENTIATED ADENOCARCINOMA WITH SURFACE ULCERATION. 1 of 4 FINAL for Derek Rodriguez, Derek Rodriguez [phone]) Diagnosis(continued) - TUMOR INVADES THROUGH MUSCULARIS MUCOSA AND PERFORATES SEROSAL SURFACE. - TUMOR MEASURES 10.3 CM  IN GREATEST DIMENSION. - EXTENSIVE LYMPH/VASCULAR INVASION IS IDENTIFIED. - DISTAL MARGIN DEMONSTRATES ADENOCARCINOMA INVOLVING THE LYMPH/VASCULAR SPACES. - PROXIMAL MARGIN IS NEGATIVE. - ELEVEN OF TWELVE LYMPH NODES ARE POSITIVE FOR METASTATIC ADENOCARCINOMA (11/12). - EXTRACAPSULAR EXTENSION IS PRESENT. 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  Unfortunately he is at high risk for recurrence and I strongly emphasized the need for adjuvant therapy. I would also recommend potential referral near the completion of therapy for HIPEC evaluation at [facility]. Daishaun agreed to try FOLFOX after he was intolerant of XELOX. He is here today for ongoing therapy.  He has struggled through chemotherapy. Initially secondary to nausea/vomiting. This has improved. Weight loss was initially significant. It continues although not at the same rate. I will have him meet with Ovid Curd again. Will consider changing his lexapro to Remeron, which may also increase his appetite. I have ordered CT abdomen/pelvis prior to his next cycle of therapy given his high risk disease and his ongoing inability to tolerate food. Will review at follow-up.   I again addressed HIPEC. He listened. We will address again at follow-up. He continues to decline, but I have adamantly encouraged the patient and his wife to at least consider consultation.   Labs  reviewed with the patient in detail, documented in the note above. Low testosterone levels noted and reviewed with the patient.   Short term disability to [place].  I will see if we can get him some calorie dense drinks through [place]. I will speak with Ovid Curd about the patient's case.   I have written him a prescription for clonazepam to help him sleep. He is to discontinue his [place].   He will return for follow up in 2 weeks with treatment.   Orders Placed This Encounter  Procedures  . CT Abdomen Pelvis W Contrast    Standing Status:   Future    Standing Expiration Date:   03/23/2017    Order Specific Question:   If indicated for the ordered procedure, I authorize the administration of contrast media per Radiology protocol    Answer:   Yes    Order Specific Question:   Reason for Exam (SYMPTOM  OR DIAGNOSIS REQUIRED)    Answer:   stage III high risk CRC, abdominal pain, weight loss    Order Specific Question:   Preferred imaging location?    Answer:   [facility]   Meds ordered this encounter  Medications  . clonazePAM (KLONOPIN) 1 MG tablet    Sig: Take 1 tablet (1 mg total) by mouth at bedtime.    Dispense:  30 tablet    Refill:  0   All questions were answered. The patient knows to call the clinic with any problems, questions or concerns. We can certainly see the patient much sooner if necessary.  This document serves as a record of services personally performed by Ancil Linsey, MD. It was created on her behalf by Arlyce Harman, a trained medical scribe. The creation of this record is based on the scribe's personal observations and the provider's statements to them. This document has been checked and approved by the attending provider.  I have reviewed the above documentation for accuracy and completeness, and I agree with the above.  This note is electronically signed by: Molli Hazard, MD  03/23/2016 9:32 AM

## 2016-03-25 ENCOUNTER — Encounter (HOSPITAL_COMMUNITY): Payer: Self-pay | Admitting: Hematology & Oncology

## 2016-03-26 ENCOUNTER — Ambulatory Visit (HOSPITAL_COMMUNITY): Payer: BLUE CROSS/BLUE SHIELD

## 2016-03-27 ENCOUNTER — Encounter (HOSPITAL_COMMUNITY): Payer: Self-pay

## 2016-03-27 ENCOUNTER — Encounter (HOSPITAL_BASED_OUTPATIENT_CLINIC_OR_DEPARTMENT_OTHER): Payer: BLUE CROSS/BLUE SHIELD

## 2016-03-27 VITALS — BP 130/96 | HR 83 | Temp 97.9°F | Resp 18 | Wt 193.0 lb

## 2016-03-27 DIAGNOSIS — Z5189 Encounter for other specified aftercare: Secondary | ICD-10-CM | POA: Diagnosis not present

## 2016-03-27 DIAGNOSIS — C187 Malignant neoplasm of sigmoid colon: Secondary | ICD-10-CM

## 2016-03-27 MED ORDER — PEGFILGRASTIM INJECTION 6 MG/0.6ML ~~LOC~~
6.0000 mg | PREFILLED_SYRINGE | Freq: Once | SUBCUTANEOUS | Status: AC
  Administered 2016-03-27: 6 mg via SUBCUTANEOUS

## 2016-03-27 NOTE — Progress Notes (Signed)
Derek Rodriguez presents today for injection per MD orders. Neulasta 6mg  administered SQ in right Abdomen. Administration without incident. Patient tolerated well.  Vitals stable, discharged from clinic ambulatory.  Patient returned continuous 5FU pump. Disposed of chemo per protocol.

## 2016-03-27 NOTE — Patient Instructions (Signed)
[place] at [facility] Discharge Instructions  RECOMMENDATIONS MADE BY THE CONSULTANT AND ANY TEST RESULTS WILL BE SENT TO YOUR REFERRING PHYSICIAN.  Neulasta given today Follow up as scheduled.  Thank you for choosing [place] at [facility] to provide your oncology and hematology care.  To afford each patient quality time with our provider, please arrive at least 15 minutes before your scheduled appointment time.   Beginning January 23rd 2017 lab work for the [facility] will be done in the  Main lab at [facility] on 1st floor. If you have a lab appointment with the [facility] please come in thru the  Main Entrance and check in at the main information desk  You need to re-schedule your appointment should you arrive 10 or more minutes late.  We strive to give you quality time with our providers, and arriving late affects you and other patients whose appointments are after yours.  Also, if you no show three or more times for appointments you may be dismissed from the clinic at the providers discretion.     Again, thank you for choosing [facility].  Our hope is that these requests will decrease the amount of time that you wait before being seen by our physicians.       _____________________________________________________________  Should you have questions after your visit to [facility], please contact our office at [phone] between the hours of 8:30 a.m. and 4:30 p.m.  Voicemails left after 4:30 p.m. will not be returned until the following business day.  For prescription refill requests, have your pharmacy contact our office.         Resources For Cancer Patients and their Caregivers ? American Cancer Society: Can assist with transportation, wigs, general needs, runs Look Good Feel Better.        (208)093-4286 ? Cancer Care: Provides financial assistance, online support groups, medication/co-pay  assistance.  1-800-813-HOPE 628 252 2239) ? [facility] Assists [place] cancer patients and their families through emotional , educational and financial support.  (702)275-5164 ? [place] DSS Where to apply for food stamps, Medicaid and utility assistance. 437-797-0542 ? [facility]: Transportation to medical appointments. 434-782-5713 ? Social Security Administration: May apply for disability if have a Stage IV cancer. 340-551-2234 (437)622-8380 ? [employer], Disability and Transit Services: Assists with nutrition, care and transit needs. Cash Support Programs: @10RELATIVEDAYS @ > Cancer Support Group  2nd Tuesday of the month 1pm-2pm, Journey Room  > Creative Journey  3rd Tuesday of the month 1130am-1pm, Journey Room  > Look Good Feel Better  1st Wednesday of the month 10am-12 noon, Journey Room (Call [place] to register [phone])

## 2016-03-30 ENCOUNTER — Ambulatory Visit (HOSPITAL_COMMUNITY): Payer: BLUE CROSS/BLUE SHIELD

## 2016-04-02 ENCOUNTER — Ambulatory Visit (HOSPITAL_COMMUNITY): Payer: BLUE CROSS/BLUE SHIELD

## 2016-04-04 ENCOUNTER — Ambulatory Visit (HOSPITAL_COMMUNITY)
Admission: RE | Admit: 2016-04-04 | Discharge: 2016-04-04 | Disposition: A | Discharge status: Home / Self Care | Payer: BLUE CROSS/BLUE SHIELD | Source: Ambulatory Visit | Attending: Hematology & Oncology | Admitting: Hematology & Oncology

## 2016-04-04 DIAGNOSIS — C187 Malignant neoplasm of sigmoid colon: Secondary | ICD-10-CM | POA: Diagnosis present

## 2016-04-04 DIAGNOSIS — Z9049 Acquired absence of other specified parts of digestive tract: Secondary | ICD-10-CM | POA: Insufficient documentation

## 2016-04-04 DIAGNOSIS — R938 Abnormal findings on diagnostic imaging of other specified body structures: Secondary | ICD-10-CM | POA: Insufficient documentation

## 2016-04-04 MED ORDER — IOPAMIDOL (ISOVUE-300) INJECTION 61%
100.0000 mL | Freq: Once | INTRAVENOUS | Status: AC | PRN
Start: 2016-04-04 — End: 2016-04-04
  Administered 2016-04-04: 100 mL via INTRAVENOUS

## 2016-04-06 ENCOUNTER — Ambulatory Visit (HOSPITAL_COMMUNITY): Payer: BLUE CROSS/BLUE SHIELD | Admitting: Hematology & Oncology

## 2016-04-06 ENCOUNTER — Encounter (HOSPITAL_BASED_OUTPATIENT_CLINIC_OR_DEPARTMENT_OTHER): Payer: BLUE CROSS/BLUE SHIELD

## 2016-04-06 ENCOUNTER — Encounter: Payer: Self-pay | Admitting: Dietician

## 2016-04-06 VITALS — BP 116/73 | HR 70 | Temp 97.7°F | Resp 16 | Wt 201.0 lb

## 2016-04-06 DIAGNOSIS — C7989 Secondary malignant neoplasm of other specified sites: Secondary | ICD-10-CM | POA: Diagnosis not present

## 2016-04-06 DIAGNOSIS — Z5111 Encounter for antineoplastic chemotherapy: Secondary | ICD-10-CM

## 2016-04-06 DIAGNOSIS — C187 Malignant neoplasm of sigmoid colon: Secondary | ICD-10-CM | POA: Diagnosis not present

## 2016-04-06 LAB — COMPREHENSIVE METABOLIC PANEL
ALT: 26 U/L (ref 17–63)
AST: 32 U/L (ref 15–41)
Albumin: 3.5 g/dL (ref 3.5–5.0)
Alkaline Phosphatase: 112 U/L (ref 38–126)
Anion gap: 7 (ref 5–15)
BUN: 7 mg/dL (ref 6–20)
CHLORIDE: 105 mmol/L (ref 101–111)
CO2: 26 mmol/L (ref 22–32)
CREATININE: 0.94 mg/dL (ref 0.61–1.24)
Calcium: 9.1 mg/dL (ref 8.9–10.3)
GFR calc non Af Amer: >60 mL/min (ref >60)
Glucose, Bld: 144 mg/dL — ABNORMAL HIGH (ref 65–99)
Potassium: 3.5 mmol/L (ref 3.5–5.1)
SODIUM: 138 mmol/L (ref 135–145)
Total Bilirubin: 0.6 mg/dL (ref 0.3–1.2)
Total Protein: 6.7 g/dL (ref 6.5–8.1)

## 2016-04-06 LAB — CBC WITH DIFFERENTIAL/PLATELET
Basophils Absolute: 0 10*3/uL (ref 0.0–0.1)
Basophils Relative: 0 %
EOS ABS: 0.3 10*3/uL (ref 0.0–0.7)
EOS PCT: 4 %
HCT: 42.5 % (ref 39.0–52.0)
Hemoglobin: 14.6 g/dL (ref 13.0–17.0)
LYMPHS ABS: 1.7 10*3/uL (ref 0.7–4.0)
LYMPHS PCT: 24 %
MCH: 32.3 pg (ref 26.0–34.0)
MCHC: 34.4 g/dL (ref 30.0–36.0)
MCV: 94 fL (ref 78.0–100.0)
MONO ABS: 0.8 10*3/uL (ref 0.1–1.0)
MONOS PCT: 12 %
Neutro Abs: 4.3 10*3/uL (ref 1.7–7.7)
Neutrophils Relative %: 60 %
PLATELETS: 147 10*3/uL — AB (ref 150–400)
RBC: 4.52 MIL/uL (ref 4.22–5.81)
RDW: 17.1 % — AB (ref 11.5–15.5)
WBC: 7 10*3/uL (ref 4.0–10.5)

## 2016-04-06 MED ORDER — OXALIPLATIN CHEMO INJECTION 100 MG/20ML
68.0000 mg/m2 | Freq: Once | INTRAVENOUS | Status: AC
  Administered 2016-04-06: 155 mg via INTRAVENOUS
  Filled 2016-04-06: qty 31

## 2016-04-06 MED ORDER — PALONOSETRON HCL INJECTION 0.25 MG/5ML
0.2500 mg | Freq: Once | INTRAVENOUS | Status: AC
  Administered 2016-04-06: 0.25 mg via INTRAVENOUS
  Filled 2016-04-06: qty 5

## 2016-04-06 MED ORDER — HEPARIN SOD (PORK) LOCK FLUSH 100 UNIT/ML IV SOLN
500.0000 [IU] | Freq: Once | INTRAVENOUS | Status: DC | PRN
  Filled 2016-04-06: qty 5

## 2016-04-06 MED ORDER — DEXTROSE 5 % IV SOLN
Freq: Once | INTRAVENOUS | Status: AC
  Administered 2016-04-06: 11:00:00 via INTRAVENOUS

## 2016-04-06 MED ORDER — LORAZEPAM 2 MG/ML IJ SOLN
0.5000 mg | Freq: Once | INTRAMUSCULAR | Status: AC
  Administered 2016-04-06: 0.5 mg via INTRAVENOUS
  Filled 2016-04-06: qty 1

## 2016-04-06 MED ORDER — LEUCOVORIN CALCIUM INJECTION 350 MG
400.0000 mg/m2 | Freq: Once | INTRAVENOUS | Status: AC
  Administered 2016-04-06: 920 mg via INTRAVENOUS
  Filled 2016-04-06: qty 46

## 2016-04-06 MED ORDER — SODIUM CHLORIDE 0.9% FLUSH: 10.0000 mL | INTRAVENOUS | Status: DC | PRN

## 2016-04-06 MED ORDER — FOSAPREPITANT DIMEGLUMINE INJECTION 150 MG
Freq: Once | INTRAVENOUS | Status: AC
  Administered 2016-04-06: 11:00:00 via INTRAVENOUS
  Filled 2016-04-06: qty 5

## 2016-04-06 MED ORDER — SODIUM CHLORIDE 0.9 % IV SOLN
2400.0000 mg/m2 | INTRAVENOUS | Status: DC
  Administered 2016-04-06: 5500 mg via INTRAVENOUS
  Filled 2016-04-06: qty 100

## 2016-04-06 NOTE — Progress Notes (Signed)
Follow up with pt who had severe weight loss and poor PO intake  Contacted Pt by Visiting during his infusion   Wt Readings from Last 10 Encounters:  04/06/16 201 lb (91.2 kg)  03/27/16 193 lb (87.5 kg)  03/23/16 191 lb 9.6 oz (86.9 kg)  03/02/16 200 lb 8 oz (90.9 kg)  02/21/16 197 lb (89.4 kg)  02/17/16 195 lb 6.4 oz (88.6 kg)  02/06/16 203 lb (92.1 kg)  02/06/16 203 lb (92.1 kg)  02/03/16 205 lb 9.6 oz (93.3 kg)  01/30/16 212 lb (96.2 kg)   Patient weight has INCREASED by 8 lbs.   Excitingly, pt has completely changed since last encounter. He appears to be in a exceedingly better mood is now eating food instead of being 100% reliant on Ensure and Water. RN had stated he was eating a Big Mac when he came in apparently was "singing in the car" this morning.When RD arrived he was eating a bag of potato chips.  He says he does not know what happened. "A flip just switched". One day he got up and said "I think I will try a slice of pie today". He tolerated that well and enjoyed it. The next day he said he tried a hamburger and enjoyed it. This behavior snowballed and now he is back to eating meals. He is still consuming Ensure BID and stated he would like another case of Ensure at this time. Will order his 3rd and final case.   RN had noted he had been started on Wellbutrin about 1 month ago. This may be reason for dramatically improved mood and PO intake.    He has mild constipation which he says is improving and mild nausea which is well managed with his medications.   Provided much positive feedback for his changes and encouraged continued good PO intake.   Derek Rodriguez RD, LDN, [facility] Pager: [phone] 04/06/2016 11:36 AM

## 2016-04-06 NOTE — Patient Instructions (Signed)
[facility] Discharge Instructions for Patients Receiving Chemotherapy   Beginning January 23rd 2017 lab work for the Cancer Center will be done in the  Main lab at [place] on 1st floor. If you have a lab appointment with the Cancer Center please come in thru the  Main Entrance and check in at the main information desk   Today you received the following chemotherapy agents: oxaliplatin, fluorouracil, and leucovorin.     If you develop nausea and vomiting, or diarrhea that is not controlled by your medication, call the clinic.  The clinic phone number is [phone]. Office hours are Monday-Friday 8:30am-5:00pm.  BELOW ARE SYMPTOMS THAT SHOULD BE REPORTED IMMEDIATELY:  *FEVER GREATER THAN 101.0 F  *CHILLS WITH OR WITHOUT FEVER  NAUSEA AND VOMITING THAT IS NOT CONTROLLED WITH YOUR NAUSEA MEDICATION  *UNUSUAL SHORTNESS OF BREATH  *UNUSUAL BRUISING OR BLEEDING  TENDERNESS IN MOUTH AND THROAT WITH OR WITHOUT PRESENCE OF ULCERS  *URINARY PROBLEMS  *BOWEL PROBLEMS  UNUSUAL RASH Items with * indicate a potential emergency and should be followed up as soon as possible. If you have an emergency after office hours please contact your primary care physician or go to the nearest emergency department.  Please call the clinic during office hours if you have any questions or concerns.   You may also contact the Patient Navigator at [phone] should you have any questions or need assistance in obtaining follow up care.      Resources For Cancer Patients and their Caregivers ? American Cancer Society: Can assist with transportation, wigs, general needs, runs Look Good Feel Better.        1-888-227-6333 ? Cancer Care: Provides financial assistance, online support groups, medication/co-pay assistance.  1-800-813-HOPE (4673) ? [facility] Assists [place] cancer patients and their families through emotional , educational and  financial support.  336-427-4357 ? [place] DSS Where to apply for food stamps, Medicaid and utility assistance. 336-342-1394 ? [facility]: Transportation to medical appointments. 336-347-2287 ? Social Security Administration: May apply for disability if have a Stage IV cancer. 336-342-7796 1-800-772-1213 ? [place] [facility], Disability and Transit Services: Assists with nutrition, care and transit needs. 336-349-2343          

## 2016-04-09 ENCOUNTER — Encounter (HOSPITAL_COMMUNITY): Payer: BLUE CROSS/BLUE SHIELD | Attending: Oncology

## 2016-04-09 VITALS — BP 116/82 | HR 84 | Temp 98.1°F | Resp 18

## 2016-04-09 DIAGNOSIS — C187 Malignant neoplasm of sigmoid colon: Secondary | ICD-10-CM

## 2016-04-09 MED ORDER — PEGFILGRASTIM INJECTION 6 MG/0.6ML ~~LOC~~
PREFILLED_SYRINGE | SUBCUTANEOUS | Status: AC
  Filled 2016-04-09: qty 1.2

## 2016-04-09 MED ORDER — PEGFILGRASTIM INJECTION 6 MG/0.6ML ~~LOC~~
6.0000 mg | PREFILLED_SYRINGE | Freq: Once | SUBCUTANEOUS | Status: AC
  Administered 2016-04-09: 6 mg via SUBCUTANEOUS

## 2016-04-10 ENCOUNTER — Encounter (HOSPITAL_COMMUNITY): Payer: Self-pay

## 2016-04-10 NOTE — Patient Instructions (Signed)
[place] at [facility] Discharge Instructions  RECOMMENDATIONS MADE BY THE CONSULTANT AND ANY TEST RESULTS WILL BE SENT TO YOUR REFERRING PHYSICIAN.  Neulasta injection today. Return as scheduled for chemotherapy and office visit.   Thank you for choosing [place] at [facility] to provide your oncology and hematology care.  To afford each patient quality time with our provider, please arrive at least 15 minutes before your scheduled appointment time.   Beginning January 23rd 2017 lab work for the [facility] will be done in the  Main lab at [facility] on 1st floor. If you have a lab appointment with the [facility] please come in thru the  Main Entrance and check in at the main information desk  You need to re-schedule your appointment should you arrive 10 or more minutes late.  We strive to give you quality time with our providers, and arriving late affects you and other patients whose appointments are after yours.  Also, if you no show three or more times for appointments you may be dismissed from the clinic at the providers discretion.     Again, thank you for choosing [facility].  Our hope is that these requests will decrease the amount of time that you wait before being seen by our physicians.       _____________________________________________________________  Should you have questions after your visit to [facility], please contact our office at [phone] between the hours of 8:30 a.m. and 4:30 p.m.  Voicemails left after 4:30 p.m. will not be returned until the following business day.  For prescription refill requests, have your pharmacy contact our office.         Resources For Cancer Patients and their Caregivers ? American Cancer Society: Can assist with transportation, wigs, general needs, runs Look Good Feel Better.        780 863 0934 ? Cancer Care: Provides financial assistance,  online support groups, medication/co-pay assistance.  1-800-813-HOPE 208-387-4072) ? [facility] Assists [place] cancer patients and their families through emotional , educational and financial support.  250 012 7759 ? [place] DSS Where to apply for food stamps, Medicaid and utility assistance. 367-178-4226 ? [employer]: Transportation to medical appointments. 250-283-3128 ? Social Security Administration: May apply for disability if have a Stage IV cancer. 8322079571 450-220-7367 ? [employer], Disability and Transit Services: Assists with nutrition, care and transit needs. [place] Support Programs: @10RELATIVEDAYS @ > Cancer Support Group  2nd Tuesday of the month 1pm-2pm, Journey Room  > Creative Journey  3rd Tuesday of the month 1130am-1pm, Journey Room  > Look Good Feel Better  1st Wednesday of the month 10am-12 noon, Journey Room (Call [place] to register [phone])

## 2016-04-10 NOTE — Progress Notes (Signed)
Derek Rodriguez presents today for injection per the provider's orders.  Neulasta administration without incident; see MAR for injection details.  Patient tolerated procedure well and without incident.  No questions or complaints noted at this time.

## 2016-04-13 ENCOUNTER — Ambulatory Visit (HOSPITAL_COMMUNITY): Payer: BLUE CROSS/BLUE SHIELD

## 2016-04-13 ENCOUNTER — Telehealth (HOSPITAL_COMMUNITY): Payer: Self-pay | Admitting: Hematology & Oncology

## 2016-04-13 NOTE — Telephone Encounter (Signed)
FAXED DISABILITY PAPERS TO [employer]

## 2016-04-15 NOTE — Progress Notes (Signed)
Patient on plan of care prior to pathways. 

## 2016-04-16 ENCOUNTER — Ambulatory Visit (HOSPITAL_COMMUNITY): Payer: BLUE CROSS/BLUE SHIELD

## 2016-04-20 ENCOUNTER — Encounter (HOSPITAL_BASED_OUTPATIENT_CLINIC_OR_DEPARTMENT_OTHER): Payer: BLUE CROSS/BLUE SHIELD

## 2016-04-20 ENCOUNTER — Encounter: Payer: Self-pay | Admitting: Dietician

## 2016-04-20 ENCOUNTER — Encounter (HOSPITAL_COMMUNITY): Payer: BLUE CROSS/BLUE SHIELD | Attending: Oncology | Admitting: Hematology & Oncology

## 2016-04-20 ENCOUNTER — Encounter (HOSPITAL_COMMUNITY): Payer: Self-pay | Admitting: Hematology & Oncology

## 2016-04-20 VITALS — BP 116/84 | HR 91 | Temp 98.3°F | Resp 16 | Wt 204.6 lb

## 2016-04-20 VITALS — BP 123/83 | HR 78 | Temp 97.6°F | Resp 16 | Wt 204.6 lb

## 2016-04-20 DIAGNOSIS — Z833 Family history of diabetes mellitus: Secondary | ICD-10-CM | POA: Insufficient documentation

## 2016-04-20 DIAGNOSIS — C7989 Secondary malignant neoplasm of other specified sites: Secondary | ICD-10-CM

## 2016-04-20 DIAGNOSIS — I10 Essential (primary) hypertension: Secondary | ICD-10-CM | POA: Diagnosis not present

## 2016-04-20 DIAGNOSIS — F329 Major depressive disorder, single episode, unspecified: Secondary | ICD-10-CM

## 2016-04-20 DIAGNOSIS — R112 Nausea with vomiting, unspecified: Secondary | ICD-10-CM

## 2016-04-20 DIAGNOSIS — E876 Hypokalemia: Secondary | ICD-10-CM

## 2016-04-20 DIAGNOSIS — C187 Malignant neoplasm of sigmoid colon: Secondary | ICD-10-CM

## 2016-04-20 DIAGNOSIS — Z87891 Personal history of nicotine dependence: Secondary | ICD-10-CM | POA: Insufficient documentation

## 2016-04-20 DIAGNOSIS — M1990 Unspecified osteoarthritis, unspecified site: Secondary | ICD-10-CM | POA: Diagnosis not present

## 2016-04-20 DIAGNOSIS — K5792 Diverticulitis of intestine, part unspecified, without perforation or abscess without bleeding: Secondary | ICD-10-CM | POA: Insufficient documentation

## 2016-04-20 DIAGNOSIS — G4700 Insomnia, unspecified: Secondary | ICD-10-CM

## 2016-04-20 DIAGNOSIS — Z5111 Encounter for antineoplastic chemotherapy: Secondary | ICD-10-CM

## 2016-04-20 DIAGNOSIS — D649 Anemia, unspecified: Secondary | ICD-10-CM | POA: Insufficient documentation

## 2016-04-20 DIAGNOSIS — Z79899 Other long term (current) drug therapy: Secondary | ICD-10-CM | POA: Insufficient documentation

## 2016-04-20 DIAGNOSIS — Z9889 Other specified postprocedural states: Secondary | ICD-10-CM | POA: Insufficient documentation

## 2016-04-20 LAB — CBC WITH DIFFERENTIAL/PLATELET
BASOS ABS: 0 10*3/uL (ref 0.0–0.1)
BASOS PCT: 1 %
Eosinophils Absolute: 0.2 10*3/uL (ref 0.0–0.7)
Eosinophils Relative: 3 %
HEMATOCRIT: 39.8 % (ref 39.0–52.0)
HEMOGLOBIN: 13.6 g/dL (ref 13.0–17.0)
Lymphocytes Relative: 20 %
Lymphs Abs: 1.2 10*3/uL (ref 0.7–4.0)
MCH: 32.5 pg (ref 26.0–34.0)
MCHC: 34.2 g/dL (ref 30.0–36.0)
MCV: 95 fL (ref 78.0–100.0)
MONOS PCT: 10 %
Monocytes Absolute: 0.6 10*3/uL (ref 0.1–1.0)
NEUTROS ABS: 4 10*3/uL (ref 1.7–7.7)
NEUTROS PCT: 66 %
Platelets: 129 10*3/uL — ABNORMAL LOW (ref 150–400)
RBC: 4.19 MIL/uL — AB (ref 4.22–5.81)
RDW: 16.5 % — ABNORMAL HIGH (ref 11.5–15.5)
WBC: 6 10*3/uL (ref 4.0–10.5)

## 2016-04-20 LAB — COMPREHENSIVE METABOLIC PANEL
ALBUMIN: 3.2 g/dL — AB (ref 3.5–5.0)
ALK PHOS: 107 U/L (ref 38–126)
ALT: 16 U/L — AB (ref 17–63)
AST: 27 U/L (ref 15–41)
Anion gap: 6 (ref 5–15)
BILIRUBIN TOTAL: 0.7 mg/dL (ref 0.3–1.2)
BUN: 8 mg/dL (ref 6–20)
CALCIUM: 8.7 mg/dL — AB (ref 8.9–10.3)
CO2: 25 mmol/L (ref 22–32)
Chloride: 106 mmol/L (ref 101–111)
Creatinine, Ser: 0.77 mg/dL (ref 0.61–1.24)
GFR calc Af Amer: >60 mL/min (ref >60)
GFR calc non Af Amer: >60 mL/min (ref >60)
GLUCOSE: 163 mg/dL — AB (ref 65–99)
Potassium: 3.3 mmol/L — ABNORMAL LOW (ref 3.5–5.1)
Sodium: 137 mmol/L (ref 135–145)
TOTAL PROTEIN: 6.2 g/dL — AB (ref 6.5–8.1)

## 2016-04-20 MED ORDER — DEXTROSE 5 % IV SOLN
Freq: Once | INTRAVENOUS | Status: AC
  Administered 2016-04-20: 10:00:00 via INTRAVENOUS

## 2016-04-20 MED ORDER — GRANISETRON 3.1 MG/24HR TD PTCH
MEDICATED_PATCH | TRANSDERMAL | 5 refills | Status: DC
Start: 2016-04-20 — End: 2016-06-08

## 2016-04-20 MED ORDER — SODIUM CHLORIDE 0.9% FLUSH
10.0000 mL | INTRAVENOUS | Status: DC | PRN
  Administered 2016-04-20: 10 mL
  Filled 2016-04-20: qty 10

## 2016-04-20 MED ORDER — SODIUM CHLORIDE 0.9 % IV SOLN
Freq: Once | INTRAVENOUS | Status: AC
  Administered 2016-04-20: 11:00:00 via INTRAVENOUS
  Filled 2016-04-20: qty 5

## 2016-04-20 MED ORDER — PALONOSETRON HCL INJECTION 0.25 MG/5ML
0.2500 mg | Freq: Once | INTRAVENOUS | Status: AC
  Administered 2016-04-20: 0.25 mg via INTRAVENOUS
  Filled 2016-04-20: qty 5

## 2016-04-20 MED ORDER — DEXTROSE 5 % IV SOLN
68.0000 mg/m2 | Freq: Once | INTRAVENOUS | Status: AC
Start: 2016-04-20 — End: 2016-04-20
  Administered 2016-04-20: 155 mg via INTRAVENOUS
  Filled 2016-04-20: qty 31

## 2016-04-20 MED ORDER — CLONAZEPAM 1 MG PO TABS: 1.0000 mg | ORAL_TABLET | Freq: Every day | ORAL | 1 refills | Status: DC

## 2016-04-20 MED ORDER — LORAZEPAM 2 MG/ML IJ SOLN
0.5000 mg | Freq: Once | INTRAMUSCULAR | Status: AC
  Administered 2016-04-20: 0.5 mg via INTRAVENOUS
  Filled 2016-04-20: qty 1

## 2016-04-20 MED ORDER — LEUCOVORIN CALCIUM INJECTION 350 MG
400.0000 mg/m2 | Freq: Once | INTRAMUSCULAR | Status: AC
Start: 2016-04-20 — End: 2016-04-20
  Administered 2016-04-20: 920 mg via INTRAVENOUS
  Filled 2016-04-20: qty 46

## 2016-04-20 MED ORDER — SODIUM CHLORIDE 0.9 % IV SOLN
2400.0000 mg/m2 | INTRAVENOUS | Status: DC
  Administered 2016-04-20: 5500 mg via INTRAVENOUS
  Filled 2016-04-20: qty 100

## 2016-04-20 MED ORDER — POTASSIUM CHLORIDE CRYS ER 20 MEQ PO TBCR
40.0000 meq | EXTENDED_RELEASE_TABLET | Freq: Once | ORAL | Status: AC
  Administered 2016-04-20: 40 meq via ORAL
  Filled 2016-04-20: qty 2

## 2016-04-20 NOTE — Patient Instructions (Signed)
[facility] Discharge Instructions for Patients Receiving Chemotherapy   Beginning January 23rd 2017 lab work for the [facility] will be done in the  Main lab at [facility] on 1st floor. If you have a lab appointment with the [facility] please come in thru the  Main Entrance and check in at the main information desk   Today you received the following chemotherapy agents Oxaliplatin, Leucovorin, 5FU  To help prevent nausea and vomiting after your treatment, we encourage you to take your nausea medication     If you develop nausea and vomiting, or diarrhea that is not controlled by your medication, call the clinic.  The clinic phone number is [phone]. Office hours are Monday-Friday 8:30am-5:00pm.  BELOW ARE SYMPTOMS THAT SHOULD BE REPORTED IMMEDIATELY:  *FEVER GREATER THAN 101.0 F  *CHILLS WITH OR WITHOUT FEVER  NAUSEA AND VOMITING THAT IS NOT CONTROLLED WITH YOUR NAUSEA MEDICATION  *UNUSUAL SHORTNESS OF BREATH  *UNUSUAL BRUISING OR BLEEDING  TENDERNESS IN MOUTH AND THROAT WITH OR WITHOUT PRESENCE OF ULCERS  *URINARY PROBLEMS  *BOWEL PROBLEMS  UNUSUAL RASH Items with * indicate a potential emergency and should be followed up as soon as possible. If you have an emergency after office hours please contact your primary care physician or go to the nearest emergency department.  Please call the clinic during office hours if you have any questions or concerns.   You may also contact the Patient Navigator at [phone] should you have any questions or need assistance in obtaining follow up care.      Resources For Cancer Patients and their Caregivers ? American Cancer Society: Can assist with transportation, wigs, general needs, runs Look Good Feel Better.        (404) 735-8937 ? Cancer Care: Provides financial assistance, online support groups, medication/co-pay assistance.  1-800-813-HOPE 628 757 4324) ? [facility] Assists [place] cancer patients and their families through emotional , educational and financial support.  604-581-9783 ? [place] DSS Where to apply for food stamps, Medicaid and utility assistance. 260 381 7648 ? [facility]: Transportation to medical appointments. (786) 041-7151 ? Social Security Administration: May apply for disability if have a Stage IV cancer. (725) [phone] ? [employer], Disability and Transit Services: Assists with nutrition, care and transit needs. [phone]

## 2016-04-20 NOTE — Progress Notes (Signed)
[facility] Hematology/Oncology Progress Note  Name: Derek Rodriguez      MRN: [id number]     Date: 04/20/2016 Time:7:54 AM   REFERRING PHYSICIAN:  Aviva Signs, MD (Gen Surg)  REASON FOR CONSULT:  Adenocarcinoma of sigmoid colon.   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    Adenocarcinoma of sigmoid colon ([place])   09/09/2015 - 09/12/2015 Hospital Admission    Diverticulitis of colon with perforation      09/12/2015 Imaging    CT abd/pelvis- Continued severe inflammation of the proximal half of the sigmoid colon. There is a 2.6 cm oval low-attenuation structure extending between the posterior wall of the inflamed sigmoid colon and the dome of the bladder....      09/27/2015 Tumor Marker    CEA: 2.5       10/03/2015 Procedure    Partial colectomy, splenic flexure takedown by Dr. Arnoldo Morale      10/03/2015 Pathology Results    FULL THICKNESS INVASIVE MODERATELY TO POORLY DIFFERENTIATED ADENOCARCINOMA WITH SURFACE ULCERATION. TUMOR INVADES THROUGH MUSCULARIS MUCOSA AND PERFORATES SEROSAL SURFACE. - TUMOR MEASURES 10.3 CM IN GREATEST DIMENSION. - EXTENSIVE LYMPH/VASCULAR INVASI      10/13/2015 Pathology Results    MSI STABLE      10/20/2015 Imaging    CT chest- Negative. No evidence of metastatic disease or other active disease within the thorax.      11/04/2015 - 11/04/2015 Chemotherapy    CapeOX      11/12/2015 Miscellaneous    ED visit with nausea and decreased po intake      11/18/2015 Procedure    Port-A-Cath insertion by Dr. Arnoldo Morale      11/25/2015 Treatment Plan Change    From Xelox to FOLFOX      11/25/2015 -  Chemotherapy    FOLFOX      01/06/2016 Treatment Plan Change    Oxaliplatin dose reduced by 20% for cycle #4 and 5FU bolus was discontinued.        HISTORY OF PRESENT ILLNESS:   Derek Rodriguez is a 55 year old white American man with a past medical history significant for diverticulitis and HTN who returns for follow-up of  Stage IIIC ([id number]) adenocarcinoma of sigmoid colon cancer, S/P partial colectomy by Dr. Arnoldo Morale.  Derek Rodriguez is accompanied by his wife and presents in the treatment chair. He is here for cycle 10 of FOLFOX.  He states that he is feeling better. He has been very active. He has been out mowing the lawn and washing his truck. He wakes up early in the morning and goes to bed around 9pm.  His mood is much better. "I'm happy again". He has been playing ball with his grandkids. His wife is excited that the chemotherapy is almost over.   His appetite is good. He's been eating "anything I can get my hands on". He has gained weight since his last visit. He said his appetite came back as fast as it went away initially. It started with his wife making some pie. He ate that, then she made something else and he ate that, too.   He states that he is experiencing some tingling in his fingers, but not his toes or feet. He can button a shirt, but says that it is a little difficult. He notes no other limitations  He has requested a refill of klonopin and sancuso patch. He is not sure if he needs a refill of wellbutrin.  He will check when he gets home.  No other complaints today. Again refuses to go to [facility] for consultation of HIPEC   Review of Systems  HENT: Negative.   Eyes: Negative.   Respiratory: Negative.   Cardiovascular: Negative.   Genitourinary: Negative.   Musculoskeletal: Negative.   Skin: Negative.   Neurological: Positive for tingling.       Tingling in fingers  Endo/Heme/Allergies: Negative.   All other systems reviewed and are negative. 14 point review of systems was performed and is negative except as detailed under history of present illness and above   PAST MEDICAL HISTORY:   Past Medical History:  Diagnosis Date  . Adenocarcinoma of sigmoid colon ([place]) 10/18/2015  . Arthritis   . Diverticulitis   . Hypertension     ALLERGIES: No Known Allergies    MEDICATIONS: I have  reviewed the patient's current medications.    Current Outpatient Prescriptions on File Prior to Visit  Medication Sig Dispense Refill  . buPROPion (WELLBUTRIN XL) 150 MG 24 hr tablet Take 1 tablet (150 mg total) by mouth daily. 30 tablet 3  . CARAFATE 1 GM/10ML suspension TAKE 10MLS (2 TEASPOONFULS) BY MOUTH 4 TIMES A DAY FOR 7 DAYS  0  . clonazePAM (KLONOPIN) 1 MG tablet Take 1 tablet (1 mg total) by mouth at bedtime. 30 tablet 0  . escitalopram (LEXAPRO) 20 MG tablet FOR THE FIRST 3 DAYS TAKE 1/2 TABLET. THEN TAKE 1 TABLET DAILY THEREAFTER. 30 tablet 1  . granisetron (SANCUSO) 3.1 MG/24HR Apply to skin starting 24 hours before chemotherapy. Remove after 7 days. 1 each 5  . Heparin Lock Flush (HEPARIN FLUSH, PORCINE,) 100 UNIT/ML injection Flush port with 5cc of heparin lock flush. 10 Syringe 0  . ibuprofen (ADVIL,MOTRIN) 200 MG tablet Take 400 mg by mouth every 6 (six) hours as needed for mild pain or moderate pain.    Marland Kitchen lidocaine-prilocaine (EMLA) cream Apply a quarter size amount to port site 1 hour prior to chemo. Do not rub in. Cover with plastic wrap. 30 g 3  . LORazepam (ATIVAN) 0.5 MG tablet Take 1 tablet (0.5 mg total) by mouth every 6 (six) hours as needed for anxiety. 120 tablet 0  . omeprazole (PRILOSEC) 40 MG capsule Take 1 capsule (40 mg total) by mouth daily. 30 capsule 6  . ondansetron (ZOFRAN) 8 MG tablet Take 1 tablet (8 mg total) by mouth every 8 (eight) hours as needed for nausea or vomiting. 30 tablet 2  . OXALIPLATIN IV Inject into the vein. (chemo) To be given once every 14 days    . oxyCODONE-acetaminophen (PERCOCET) 7.5-325 MG tablet Take 1-2 tablets by mouth every 4 (four) hours as needed for moderate pain. 90 tablet 0  . potassium chloride SA (K-DUR,KLOR-CON) 20 MEQ tablet Take 2 tablets (40 mEq total) by mouth 2 (two) times daily. 60 tablet 0  . prochlorperazine (COMPAZINE) 10 MG tablet Take 1 tablet (10 mg total) by mouth every 6 (six) hours as needed for nausea or  vomiting. 90 tablet 6  . promethazine (PHENERGAN) 25 MG suppository Place 1 suppository (25 mg total) rectally every 6 (six) hours as needed for nausea or vomiting. 6 each 0  . Sodium Chloride Flush (NORMAL SALINE FLUSH) 0.9 % SOLN Flush port with 10 ml of normal saline. 10 Syringe 0  . zolpidem (AMBIEN) 10 MG tablet Take 1 tablet (10 mg total) by mouth at bedtime as needed for sleep. 30 tablet 3   No current facility-administered  medications on file prior to visit.      PAST SURGICAL HISTORY Past Surgical History:  Procedure Laterality Date  . BIOPSY  02/06/2016   Procedure: BIOPSY;  Surgeon: Daneil Dolin, MD;  [facility];  Service: Endoscopy;;  esophagus  . ESOPHAGEAL DILATION  02/06/2016   Procedure: ESOPHAGEAL DILATION;  Surgeon: Daneil Dolin, MD;  Location: [facility];  Service: Endoscopy;;  . ESOPHAGOGASTRODUODENOSCOPY N/A 02/06/2016   Procedure: ESOPHAGOGASTRODUODENOSCOPY (EGD);  Surgeon: Daneil Dolin, MD;  Location: [facility];  Service: Endoscopy;  Laterality: N/A;  115  . PARTIAL COLECTOMY N/A 10/03/2015   Procedure: PARTIAL COLECTOMY;  Surgeon: Aviva Signs, MD;  Location: [facility] ORS;  Service: General;  Laterality: N/A;  . PORTACATH PLACEMENT N/A 11/18/2015   Procedure: INSERTION PORT-A-CATH LEFT SUBCLAVIAN;  Surgeon: Aviva Signs, MD;  Location: [facility] ORS;  Service: General;  Laterality: N/A;    FAMILY HISTORY: Family History  Problem Relation Age of Onset  . Diabetes Father    No known family history of cancer Mother alive at age 40- healthy Father deceased at age of 1 from heart disease, complications of DM 27 son 28 years old- healthy 47 grandson 39 year old- healthy  SOCIAL HISTORY:  reports that he quit smoking about 30 years ago. His smoking use included Cigars and Pipe. He quit smokeless tobacco use about 9 months ago. His smokeless tobacco use included Snuff. He reports that he does not drink alcohol or use drugs.  He works at [employer].  He is married  x 37 years.  He is [employer].  PERFORMANCE STATUS: The patient's performance status is 0 - Asymptomatic  PHYSICAL EXAM: Most Recent Vital Signs:  Vitals with BMI 04/20/2016  Height   Weight 204 lbs 10 oz  BMI   Systolic 315  Diastolic 84  Pulse 91  Respirations 16    Physical Exam  Constitutional: He is oriented to person, place, and time. He appears well-developed and well-nourished. No distress.  Weight gain of 4 lbs  HENT:  Head: Normocephalic and atraumatic.  Mouth/Throat: Oropharynx is clear and moist.  Eyes: Conjunctivae and EOM are normal. Pupils are equal, round, and reactive to light. Right eye exhibits no discharge. Left eye exhibits no discharge.  Neck: Normal range of motion. Neck supple. No thyromegaly present.  Cardiovascular: Normal rate, regular rhythm and normal heart sounds.   Pulmonary/Chest: Effort normal and breath sounds normal. No respiratory distress. He has no wheezes. He has no rales. He exhibits no tenderness.  Abdominal: Soft. Bowel sounds are normal. He exhibits no distension and no mass. There is no tenderness. There is no rebound and no guarding. No hernia.  Musculoskeletal: Normal range of motion. He exhibits no edema or deformity.  Neurological: He is alert and oriented to person, place, and time.  Skin: Skin is warm and dry.  Nursing note and vitals reviewed.   LABORATORY DATA:  I have reviewed the data as listed. Results for LEGRANDE, HAO (MRN [id number]) as of 04/20/2016 10:04  Ref. Range 04/20/2016 09:18  Sodium Latest Ref Range: 135 - 145 mmol/L 137  Potassium Latest Ref Range: 3.5 - 5.1 mmol/L 3.3 (L)  Chloride Latest Ref Range: 101 - 111 mmol/L 106  CO2 Latest Ref Range: 22 - 32 mmol/L 25  BUN Latest Ref Range: 6 - 20 mg/dL 8  Creatinine Latest Ref Range: 0.61 - 1.24 mg/dL 0.77  Calcium Latest Ref Range: 8.9 - 10.3 mg/dL 8.7 (L)  EGFR (Non-African Amer.) Latest  Ref Range: >60 mL/min >60  EGFR (African American) Latest Ref Range: >60  mL/min >60  Glucose Latest Ref Range: 65 - 99 mg/dL 163 (H)  Anion gap Latest Ref Range: 5 - 15  6  Alkaline Phosphatase Latest Ref Range: 38 - 126 U/L 107  Albumin Latest Ref Range: 3.5 - 5.0 g/dL 3.2 (L)  AST Latest Ref Range: 15 - 41 U/L 27  ALT Latest Ref Range: 17 - 63 U/L 16 (L)  Total Protein Latest Ref Range: 6.5 - 8.1 g/dL 6.2 (L)  Total Bilirubin Latest Ref Range: 0.3 - 1.2 mg/dL 0.7  WBC Latest Ref Range: 4.0 - 10.5 K/uL 6.0  RBC Latest Ref Range: 4.22 - 5.81 MIL/uL 4.19 (L)  Hemoglobin Latest Ref Range: 13.0 - 17.0 g/dL 13.6  HCT Latest Ref Range: 39.0 - 52.0 % 39.8  MCV Latest Ref Range: 78.0 - 100.0 fL 95.0  MCH Latest Ref Range: 26.0 - 34.0 pg 32.5  MCHC Latest Ref Range: 30.0 - 36.0 g/dL 34.2  RDW Latest Ref Range: 11.5 - 15.5 % 16.5 (H)  Platelets Latest Ref Range: 150 - 400 K/uL 129 (L)  Neutrophils Latest Units: % 66  Lymphocytes Latest Units: % 20  Monocytes Relative Latest Units: % 10  Eosinophil Latest Units: % 3  Basophil Latest Units: % 1  NEUT# Latest Ref Range: 1.7 - 7.7 K/uL 4.0  Lymphocyte # Latest Ref Range: 0.7 - 4.0 K/uL 1.2  Monocyte # Latest Ref Range: 0.1 - 1.0 K/uL 0.6  Eosinophils Absolute Latest Ref Range: 0.0 - 0.7 K/uL 0.2  Basophils Absolute Latest Ref Range: 0.0 - 0.1 K/uL 0.0      Lab Results  Component Value Date   CEA 2.5 09/27/2015     RADIOGRAPHY: I have personally reviewed the radiological images as listed and agreed with the findings in the report. Study Result   CLINICAL DATA:  Patient with history of stage III colon cancer.  EXAM: CT ABDOMEN AND PELVIS WITH CONTRAST  TECHNIQUE: Multidetector CT imaging of the abdomen and pelvis was performed using the standard protocol following bolus administration of intravenous contrast.  CONTRAST:  178m ISOVUE-300 IOPAMIDOL (ISOVUE-300) INJECTION 61%  COMPARISON:  CT abdomen pelvis 09/09/2015.  FINDINGS: Lower chest: Normal heart size. No consolidative  pulmonary opacities. No pleural effusion.  Hepatobiliary: Liver is normal in size and contour. Gallbladder is decompressed.  Pancreas: Unremarkable  Spleen: Unremarkable  Adrenals/Urinary Tract: The adrenal glands are normal. Kidneys enhance symmetrically with contrast. No hydronephrosis. Urinary bladder is decompressed.  Stomach/Bowel: Patient status post interval sigmoid colectomy. Anastomosis appears intact. There is surrounding soft tissue stranding at the anastomotic site. Additionally, anterior to the sigmoid colon there is a 1.6 cm soft tissue nodule (image 71; series 2). Oral contrast material throughout the large and small bowel without evidence for obstruction or perforation. Normal morphology of the stomach. Duodenum diverticulum.  Vascular/Lymphatic: Normal caliber abdominal aorta. Interval decrease in size of preaortic lymph node measuring 9 mm (image 50; series 2), previously 14 mm.  Reproductive: Heterogeneous enhancement of the prostate.  Other: None.  Musculoskeletal: Lumbar spine degenerative changes. No aggressive or acute appearing osseous lesions.  IMPRESSION: Patient status post interval sigmoid colectomy. Anastomosis appears intact. Slightly anterior to the sigmoid colon is a small soft tissue nodule which may be postsurgical in etiology. Recommend attention on followup.  Interval decrease in size of preaortic lymph node.   Electronically Signed   By: DLovey NewcomerM.D.   On: 04/05/2016 09:28  PATHOLOGY:    Diagnosis Colon, segmental resection, sigmoid - FULL THICKNESS INVASIVE MODERATELY TO POORLY DIFFERENTIATED ADENOCARCINOMA WITH SURFACE ULCERATION. 1 of 4 FINAL for [place], Claudia [phone]) Diagnosis(continued) - TUMOR INVADES THROUGH MUSCULARIS MUCOSA AND PERFORATES SEROSAL SURFACE. - TUMOR MEASURES 10.3 CM IN GREATEST DIMENSION. - EXTENSIVE LYMPH/VASCULAR INVASION IS IDENTIFIED. - DISTAL MARGIN DEMONSTRATES  ADENOCARCINOMA INVOLVING THE LYMPH/VASCULAR SPACES. - PROXIMAL MARGIN IS NEGATIVE. - ELEVEN OF TWELVE LYMPH NODES ARE POSITIVE FOR METASTATIC ADENOCARCINOMA (11/12). - EXTRACAPSULAR EXTENSION IS PRESENT. 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  Unfortunately he is at high risk for recurrence and I strongly emphasized the need for adjuvant therapy. I would also recommend potential referral near the completion of therapy for HIPEC evaluation at [facility]. Derek Rodriguez agreed to try FOLFOX after he was intolerant of XELOX. He is here today for ongoing therapy.  He has struggled through chemotherapy. Initially secondary to nausea/vomiting. This has improved. Weight loss was initially significant. He is doing better. I again addressed HIPEC. He continues to decline, but I have adamantly encouraged the patient and his wife to at least consider consultation.   Labs reviewed with the patient in detail, documented in the note above.   He has one additional cycle after today, cycle #1 was with XELOX.  I have written a refill of klonopin and sancuso patch. He will call if he needs a refill on wellbutrin.  He will return for a follow up 2 weeks.  Orders Placed This Encounter  Procedures  . CBC with Differential    Standing Status:   Future    Number of Occurrences:   1    Standing Expiration  Date:   04/20/2017  . Comprehensive metabolic panel    Standing Status:   Future    Number of Occurrences:   1    Standing Expiration Date:   04/20/2017  . CEA    Standing Status:   Future    Number of Occurrences:   1    Standing Expiration Date:   04/20/2017   Meds ordered this encounter  Medications  . clonazePAM (KLONOPIN) 1 MG tablet    Sig: Take 1 tablet (1 mg total) by mouth at bedtime.    Dispense:  30 tablet    Refill:  1  . granisetron (SANCUSO) 3.1 MG/24HR    Sig: Apply to skin starting 24 hours before chemotherapy. Remove after 7 days.    Dispense:  1 each    Refill:  5   All questions were answered. The patient knows to call the clinic with any problems, questions or concerns. We can certainly see the patient much sooner if necessary.  This document serves as a record of services personally performed by Ancil Linsey, MD. It was created on her behalf by Martinique Casey, a trained medical scribe. The creation of this record is based on the scribe's personal observations and the provider's statements to them. This document has been checked and approved by the attending provider.  I have reviewed the above  documentation for accuracy and completeness, and I agree with the above.  This note is electronically signed by: Molli Hazard, MD  04/20/2016 7:54 AM

## 2016-04-20 NOTE — Patient Instructions (Signed)
[place] at [facility] Discharge Instructions  RECOMMENDATIONS MADE BY THE CONSULTANT AND ANY TEST RESULTS WILL BE SENT TO YOUR REFERRING PHYSICIAN.  You saw Dr.Penland today. Follow up in 2 weeks for last cycle chemo, labs and MD appt. See Amy at checkout for appointments.  Thank you for choosing [place] at [facility] to provide your oncology and hematology care.  To afford each patient quality time with our provider, please arrive at least 15 minutes before your scheduled appointment time.   Beginning January 23rd 2017 lab work for the [facility] will be done in the  Main lab at [facility] on 1st floor. If you have a lab appointment with the [facility] please come in thru the  Main Entrance and check in at the main information desk  You need to re-schedule your appointment should you arrive 10 or more minutes late.  We strive to give you quality time with our providers, and arriving late affects you and other patients whose appointments are after yours.  Also, if you no show three or more times for appointments you may be dismissed from the clinic at the providers discretion.     Again, thank you for choosing [facility].  Our hope is that these requests will decrease the amount of time that you wait before being seen by our physicians.       _____________________________________________________________  Should you have questions after your visit to [facility], please contact our office at [phone] between the hours of 8:30 a.m. and 4:30 p.m.  Voicemails left after 4:30 p.m. will not be returned until the following business day.  For prescription refill requests, have your pharmacy contact our office.         Resources For Cancer Patients and their Caregivers ? American Cancer Society: Can assist with transportation, wigs, general needs, runs Look Good Feel Better.         639-408-0476 ? Cancer Care: Provides financial assistance, online support groups, medication/co-pay assistance.  1-800-813-HOPE 215-755-7474) ? [place] Assists [place] cancer patients and their families through emotional , educational and financial support.  (269)341-9300 ? [place] DSS Where to apply for food stamps, Medicaid and utility assistance. 3307263781 ? [facility]: Transportation to medical appointments. (818)841-0780 ? Social Security Administration: May apply for disability if have a Stage IV cancer. 276-012-0025 609-471-4518 ? [employer], Disability and Transit Services: Assists with nutrition, care and transit needs. [place] Support Programs: @10RELATIVEDAYS @ > Cancer Support Group  2nd Tuesday of the month 1pm-2pm, Journey Room  > Creative Journey  3rd Tuesday of the month 1130am-1pm, Journey Room  > Look Good Feel Better  1st Wednesday of the month 10am-12 noon, Journey Room (Call Nettie to register [phone])

## 2016-04-20 NOTE — Progress Notes (Signed)
Follow up with pt w/ history of poor PO intake and wt loss  Contacted Pt by visiting during his infusion  Wt Readings from Last 10 Encounters:  04/20/16 204 lb 9.6 oz (92.8 kg)  04/06/16 201 lb (91.2 kg)  03/27/16 193 lb (87.5 kg)  03/23/16 191 lb 9.6 oz (86.9 kg)  03/02/16 200 lb 8 oz (90.9 kg)  02/21/16 197 lb (89.4 kg)  02/17/16 195 lb 6.4 oz (88.6 kg)  02/06/16 203 lb (92.1 kg)  02/06/16 203 lb (92.1 kg)  02/03/16 205 lb 9.6 oz (93.3 kg)   Patient weight has increased by 3 lbs in the last 2 weeks.  Pt has continued to gain weight over the past month.   Patient reports oral intake as very good. He is eating 2 meals a day. He lists some foods such as hamburgers and spaghetti.   Pt says he wants to get back up to wt of 210-220 lbs.   Most side effects/symptoms are well managed. He still has aversion to anything cold.   RD went through dietary recall and either gave approval or detailed how to increase kcal/pro content of pts meals or the food items reported.   He is still drinking 2-3 Ensure supplements per day. His final ensure case was mistakenly not ordered at last assessment. Will order now.    Pt seems to be nutritionally stable at this time. Will cease scheduled follow ups and monitor at this time.   Burtis Junes RD, LDN, [place] Nutrition Pager: [phone] 04/20/2016 11:18 AM

## 2016-04-20 NOTE — Progress Notes (Signed)
Labs reviewed by MD and will proceed with treatment.  Chemotherapy given today per orders. Patient tolerated it well, no problems. Vitals stable, will discharge home ambulatory with wife from clinic. Follow up as scheduled.

## 2016-04-23 ENCOUNTER — Ambulatory Visit (HOSPITAL_COMMUNITY): Payer: BLUE CROSS/BLUE SHIELD

## 2016-04-24 ENCOUNTER — Other Ambulatory Visit (HOSPITAL_COMMUNITY): Payer: Self-pay | Admitting: Oncology

## 2016-04-24 ENCOUNTER — Encounter (HOSPITAL_COMMUNITY): Payer: BLUE CROSS/BLUE SHIELD | Attending: Hematology & Oncology

## 2016-04-24 ENCOUNTER — Encounter (HOSPITAL_COMMUNITY): Payer: Self-pay

## 2016-04-24 VITALS — BP 148/68 | HR 77 | Temp 98.3°F | Resp 18

## 2016-04-24 DIAGNOSIS — C187 Malignant neoplasm of sigmoid colon: Secondary | ICD-10-CM | POA: Diagnosis not present

## 2016-04-24 MED ORDER — PEGFILGRASTIM INJECTION 6 MG/0.6ML ~~LOC~~
6.0000 mg | PREFILLED_SYRINGE | Freq: Once | SUBCUTANEOUS | Status: AC
  Administered 2016-04-24: 6 mg via SUBCUTANEOUS
  Filled 2016-04-24: qty 0.6

## 2016-04-24 NOTE — Patient Instructions (Signed)
[place] at [facility] Discharge Instructions  RECOMMENDATIONS MADE BY THE CONSULTANT AND ANY TEST RESULTS WILL BE SENT TO YOUR REFERRING PHYSICIAN.  neulasta given today. Follow up as scheduled.  Thank you for choosing [place] at [facility] to provide your oncology and hematology care.  To afford each patient quality time with our provider, please arrive at least 15 minutes before your scheduled appointment time.   Beginning January 23rd 2017 lab work for the [facility] will be done in the  Main lab at [facility] on 1st floor. If you have a lab appointment with the [facility] please come in thru the  Main Entrance and check in at the main information desk  You need to re-schedule your appointment should you arrive 10 or more minutes late.  We strive to give you quality time with our providers, and arriving late affects you and other patients whose appointments are after yours.  Also, if you no show three or more times for appointments you may be dismissed from the clinic at the providers discretion.     Again, thank you for choosing [facility].  Our hope is that these requests will decrease the amount of time that you wait before being seen by our physicians.       _____________________________________________________________  Should you have questions after your visit to [facility], please contact our office at [phone] between the hours of 8:30 a.m. and 4:30 p.m.  Voicemails left after 4:30 p.m. will not be returned until the following business day.  For prescription refill requests, have your pharmacy contact our office.         Resources For Cancer Patients and their Caregivers ? American Cancer Society: Can assist with transportation, wigs, general needs, runs Look Good Feel Better.        ([phone] ? Cancer Care: Provides financial assistance, online support groups,  medication/co-pay assistance.  1-800-813-HOPE 437-547-5028) ? [place] Assists [place] cancer patients and their families through emotional , educational and financial support.  469-307-3057 ? [place] DSS Where to apply for food stamps, Medicaid and utility assistance. (251) 638-2577 ? RCATS: Transportation to medical appointments. 430-303-7944 ? Social Security Administration: May apply for disability if have a Stage IV cancer. (941)753-9853 (769) 429-8202 ? [employer], Disability and Transit Services: Assists with nutrition, care and transit needs. [place] Support Programs: @10RELATIVEDAYS @ > Cancer Support Group  2nd Tuesday of the month 1pm-2pm, Journey Room  > Creative Journey  3rd Tuesday of the month 1130am-1pm, Journey Room  > Look Good Feel Better  1st Wednesday of the month 10am-12 noon, Journey Room (Call [place] to register [phone])

## 2016-04-24 NOTE — Progress Notes (Signed)
Derek Rodriguez presents today for injection per MD orders. Neulasta 6mg  administered SQ in right Abdomen. Administration without incident. Patient tolerated well. Vitals stable and discharged home ambulatory from clinic.

## 2016-04-25 ENCOUNTER — Other Ambulatory Visit (HOSPITAL_COMMUNITY): Payer: Self-pay | Admitting: *Deleted

## 2016-04-25 DIAGNOSIS — F329 Major depressive disorder, single episode, unspecified: Secondary | ICD-10-CM

## 2016-04-25 MED ORDER — BUPROPION HCL ER (XL) 300 MG PO TB24: 300.0000 mg | ORAL_TABLET | Freq: Every day | ORAL | 1 refills | Status: DC

## 2016-05-04 ENCOUNTER — Encounter (HOSPITAL_BASED_OUTPATIENT_CLINIC_OR_DEPARTMENT_OTHER): Payer: BLUE CROSS/BLUE SHIELD

## 2016-05-04 ENCOUNTER — Encounter (HOSPITAL_BASED_OUTPATIENT_CLINIC_OR_DEPARTMENT_OTHER): Payer: BLUE CROSS/BLUE SHIELD | Admitting: Hematology & Oncology

## 2016-05-04 VITALS — BP 117/75 | HR 79 | Temp 98.0°F | Resp 16 | Wt 206.3 lb

## 2016-05-04 VITALS — BP 136/87 | HR 89 | Temp 98.0°F | Resp 16

## 2016-05-04 DIAGNOSIS — Z5111 Encounter for antineoplastic chemotherapy: Secondary | ICD-10-CM | POA: Diagnosis not present

## 2016-05-04 DIAGNOSIS — F329 Major depressive disorder, single episode, unspecified: Secondary | ICD-10-CM | POA: Diagnosis not present

## 2016-05-04 DIAGNOSIS — G620 Drug-induced polyneuropathy: Secondary | ICD-10-CM | POA: Diagnosis not present

## 2016-05-04 DIAGNOSIS — C187 Malignant neoplasm of sigmoid colon: Secondary | ICD-10-CM

## 2016-05-04 DIAGNOSIS — C7989 Secondary malignant neoplasm of other specified sites: Secondary | ICD-10-CM | POA: Diagnosis not present

## 2016-05-04 DIAGNOSIS — T451X5A Adverse effect of antineoplastic and immunosuppressive drugs, initial encounter: Secondary | ICD-10-CM

## 2016-05-04 LAB — CBC WITH DIFFERENTIAL/PLATELET
BASOS ABS: 0 10*3/uL (ref 0.0–0.1)
BASOS PCT: 0 %
Eosinophils Absolute: 0.1 10*3/uL (ref 0.0–0.7)
Eosinophils Relative: 2 %
HEMATOCRIT: 40.8 % (ref 39.0–52.0)
HEMOGLOBIN: 13.8 g/dL (ref 13.0–17.0)
LYMPHS PCT: 16 %
Lymphs Abs: 1.3 10*3/uL (ref 0.7–4.0)
MCH: 32.5 pg (ref 26.0–34.0)
MCHC: 33.8 g/dL (ref 30.0–36.0)
MCV: 96.2 fL (ref 78.0–100.0)
MONOS PCT: 12 %
Monocytes Absolute: 0.9 10*3/uL (ref 0.1–1.0)
NEUTROS ABS: 5.7 10*3/uL (ref 1.7–7.7)
NEUTROS PCT: 70 %
Platelets: 115 10*3/uL — ABNORMAL LOW (ref 150–400)
RBC: 4.24 MIL/uL (ref 4.22–5.81)
RDW: 15.6 % — ABNORMAL HIGH (ref 11.5–15.5)
WBC: 8 10*3/uL (ref 4.0–10.5)

## 2016-05-04 LAB — COMPREHENSIVE METABOLIC PANEL
ALBUMIN: 3.5 g/dL (ref 3.5–5.0)
ALK PHOS: 123 U/L (ref 38–126)
ALT: 18 U/L (ref 17–63)
AST: 33 U/L (ref 15–41)
Anion gap: 7 (ref 5–15)
BILIRUBIN TOTAL: 0.8 mg/dL (ref 0.3–1.2)
BUN: 10 mg/dL (ref 6–20)
CALCIUM: 9 mg/dL (ref 8.9–10.3)
CO2: 25 mmol/L (ref 22–32)
Chloride: 105 mmol/L (ref 101–111)
Creatinine, Ser: 1.06 mg/dL (ref 0.61–1.24)
GFR calc Af Amer: >60 mL/min (ref >60)
GFR calc non Af Amer: >60 mL/min (ref >60)
GLUCOSE: 170 mg/dL — AB (ref 65–99)
Potassium: 3.5 mmol/L (ref 3.5–5.1)
Sodium: 137 mmol/L (ref 135–145)
TOTAL PROTEIN: 6.2 g/dL — AB (ref 6.5–8.1)

## 2016-05-04 MED ORDER — LORAZEPAM 2 MG/ML IJ SOLN
0.5000 mg | Freq: Once | INTRAMUSCULAR | Status: AC
  Administered 2016-05-04: 0.5 mg via INTRAVENOUS
  Filled 2016-05-04: qty 1

## 2016-05-04 MED ORDER — PALONOSETRON HCL INJECTION 0.25 MG/5ML
0.2500 mg | Freq: Once | INTRAVENOUS | Status: AC
  Administered 2016-05-04: 0.25 mg via INTRAVENOUS
  Filled 2016-05-04: qty 5

## 2016-05-04 MED ORDER — DEXTROSE 5 % IV SOLN
Freq: Once | INTRAVENOUS | Status: AC
  Administered 2016-05-04: 10:00:00 via INTRAVENOUS

## 2016-05-04 MED ORDER — SODIUM CHLORIDE 0.9% FLUSH
10.0000 mL | INTRAVENOUS | Status: DC | PRN
  Administered 2016-05-04: 10 mL
  Filled 2016-05-04: qty 10

## 2016-05-04 MED ORDER — LEUCOVORIN CALCIUM INJECTION 350 MG
400.0000 mg/m2 | Freq: Once | INTRAMUSCULAR | Status: AC
  Administered 2016-05-04: 920 mg via INTRAVENOUS
  Filled 2016-05-04: qty 46

## 2016-05-04 MED ORDER — OXALIPLATIN CHEMO INJECTION 100 MG/20ML
66.0000 mg/m2 | Freq: Once | INTRAVENOUS | Status: AC
  Administered 2016-05-04: 150 mg via INTRAVENOUS
  Filled 2016-05-04: qty 20

## 2016-05-04 MED ORDER — SODIUM CHLORIDE 0.9 % IV SOLN
Freq: Once | INTRAVENOUS | Status: AC
  Administered 2016-05-04: 10:00:00 via INTRAVENOUS
  Filled 2016-05-04: qty 5

## 2016-05-04 MED ORDER — SODIUM CHLORIDE 0.9 % IV SOLN
2400.0000 mg/m2 | INTRAVENOUS | Status: DC
  Administered 2016-05-04: 5500 mg via INTRAVENOUS
  Filled 2016-05-04: qty 100

## 2016-05-04 NOTE — Patient Instructions (Signed)
[facility] Discharge Instructions for Patients Receiving Chemotherapy   Beginning January 23rd 2017 lab work for the Cancer Center will be done in the  Main lab at [place] on 1st floor. If you have a lab appointment with the Cancer Center please come in thru the  Main Entrance and check in at the main information desk   Today you received the following chemotherapy agents folfox   To help prevent nausea and vomiting after your treatment, we encourage you to take your nausea medication   If you develop nausea and vomiting, or diarrhea that is not controlled by your medication, call the clinic.  The clinic phone number is [phone]. Office hours are Monday-Friday 8:30am-5:00pm.  BELOW ARE SYMPTOMS THAT SHOULD BE REPORTED IMMEDIATELY:  *FEVER GREATER THAN 101.0 F  *CHILLS WITH OR WITHOUT FEVER  NAUSEA AND VOMITING THAT IS NOT CONTROLLED WITH YOUR NAUSEA MEDICATION  *UNUSUAL SHORTNESS OF BREATH  *UNUSUAL BRUISING OR BLEEDING  TENDERNESS IN MOUTH AND THROAT WITH OR WITHOUT PRESENCE OF ULCERS  *URINARY PROBLEMS  *BOWEL PROBLEMS  UNUSUAL RASH Items with * indicate a potential emergency and should be followed up as soon as possible. If you have an emergency after office hours please contact your primary care physician or go to the nearest emergency department.  Please call the clinic during office hours if you have any questions or concerns.   You may also contact the Patient Navigator at [phone] should you have any questions or need assistance in obtaining follow up care.      Resources For Cancer Patients and their Caregivers ? American Cancer Society: Can assist with transportation, wigs, general needs, runs Look Good Feel Better.        1-888-227-6333 ? Cancer Care: Provides financial assistance, online support groups, medication/co-pay assistance.  1-800-813-HOPE (4673) ? [facility] Assists [place] cancer  patients and their families through emotional , educational and financial support.  336-427-4357 ? [place] DSS Where to apply for food stamps, Medicaid and utility assistance. 336-342-1394 ? RCATS: Transportation to medical appointments. 336-347-2287 ? Social Security Administration: May apply for disability if have a Stage IV cancer. 336-342-7796 1-800-772-1213 ? [place] Aging, Disability and Transit Services: Assists with nutrition, care and transit needs. 336-349-2343          

## 2016-05-04 NOTE — Patient Instructions (Signed)
[place] at [facility] Discharge Instructions  RECOMMENDATIONS MADE BY THE CONSULTANT AND ANY TEST RESULTS WILL BE SENT TO YOUR REFERRING PHYSICIAN.  Exam with Dr. Kylar Speelman Muse today. We will see you in one month for labs and for you to see the doctor.   Please see Amy as you leave today for those appointments.    Thank you for choosing [place] at [facility] to provide your oncology and hematology care.  To afford each patient quality time with our provider, please arrive at least 15 minutes before your scheduled appointment time.   Beginning January 23rd 2017 lab work for the [facility] will be done in the  Main lab at [facility] on 1st floor. If you have a lab appointment with the [facility] please come in thru the  Main Entrance and check in at the main information desk  You need to re-schedule your appointment should you arrive 10 or more minutes late.  We strive to give you quality time with our providers, and arriving late affects you and other patients whose appointments are after yours.  Also, if you no show three or more times for appointments you may be dismissed from the clinic at the providers discretion.     Again, thank you for choosing [facility].  Our hope is that these requests will decrease the amount of time that you wait before being seen by our physicians.       _____________________________________________________________  Should you have questions after your visit to [facility], please contact our office at [phone] between the hours of 8:30 a.m. and 4:30 p.m.  Voicemails left after 4:30 p.m. will not be returned until the following business day.  For prescription refill requests, have your pharmacy contact our office.         Resources For Cancer Patients and their Caregivers ? American Cancer Society: Can assist with transportation, wigs, general needs, runs Look Good Feel  Better.        847-330-5998 ? Cancer Care: Provides financial assistance, online support groups, medication/co-pay assistance.  1-800-813-HOPE (431)370-8290) ? [place] Assists [place] cancer patients and their families through emotional , educational and financial support.  4808842880 ? [place] DSS Where to apply for food stamps, Medicaid and utility assistance. (671)465-2419 ? RCATS: Transportation to medical appointments. 872 276 7363 ? Social Security Administration: May apply for disability if have a Stage IV cancer. ([phone] 443-297-8111 ? [employer], Disability and Transit Services: Assists with nutrition, care and transit needs. [place] Support Programs: @10RELATIVEDAYS @ > Cancer Support Group  2nd Tuesday of the month 1pm-2pm, Journey Room  > Creative Journey  3rd Tuesday of the month 1130am-1pm, Journey Room  > Look Good Feel Better  1st Wednesday of the month 10am-12 noon, Journey Room (Call [place] to register [phone])

## 2016-05-04 NOTE — Progress Notes (Signed)
Chemotherapy given today . Continuous 5FU pump administered and connected per orders. Patient tolerated chemo today without any problems, vitals stable and discharged with wife ambulatory. Follow up as scheduled.

## 2016-05-04 NOTE — Progress Notes (Signed)
[facility] Hematology/Oncology Progress Note  Name: Derek Rodriguez      MRN: [id number]     Date: 05/04/2016 Time:7:53 AM   REFERRING PHYSICIAN:  Aviva Signs, MD (Gen Surg)  REASON FOR CONSULT:  Adenocarcinoma of sigmoid colon.   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    Adenocarcinoma of sigmoid colon ([place])   09/09/2015 - 09/12/2015 Hospital Admission    Diverticulitis of colon with perforation      09/12/2015 Imaging    CT abd/pelvis- Continued severe inflammation of the proximal half of the sigmoid colon. There is a 2.6 cm oval low-attenuation structure extending between the posterior wall of the inflamed sigmoid colon and the dome of the bladder....      09/27/2015 Tumor Marker    CEA: 2.5       10/03/2015 Procedure    Partial colectomy, splenic flexure takedown by Dr. Arnoldo Morale      10/03/2015 Pathology Results    FULL THICKNESS INVASIVE MODERATELY TO POORLY DIFFERENTIATED ADENOCARCINOMA WITH SURFACE ULCERATION. TUMOR INVADES THROUGH MUSCULARIS MUCOSA AND PERFORATES SEROSAL SURFACE. - TUMOR MEASURES 10.3 CM IN GREATEST DIMENSION. - EXTENSIVE LYMPH/VASCULAR INVASI      10/13/2015 Pathology Results    MSI STABLE      10/20/2015 Imaging    CT chest- Negative. No evidence of metastatic disease or other active disease within the thorax.      11/04/2015 - 11/04/2015 Chemotherapy    CapeOX      11/12/2015 Miscellaneous    ED visit with nausea and decreased po intake      11/18/2015 Procedure    Port-A-Cath insertion by Dr. Arnoldo Morale      11/25/2015 Treatment Plan Change    From Xelox to FOLFOX      11/25/2015 -  Chemotherapy    FOLFOX      01/06/2016 Treatment Plan Change    Oxaliplatin dose reduced by 20% for cycle #4 and 5FU bolus was discontinued.        HISTORY OF PRESENT ILLNESS:   Mr. Bolle is a 55 year old white American man with a past medical history significant for diverticulitis and HTN who returns for follow-up  of Stage IIIC ([id number]) adenocarcinoma of sigmoid colon cancer, S/P partial colectomy by Dr. Arnoldo Morale.  Mr. Strahm is accompanied by his wife. Today he is receiving cycle 11 of colorectal folfox.   He says he is not going to go to [facility] even to discuss HIPEC. He says he's done with surgeries. He said he'll take his chances.   He wants to know when he can go back to work. He wants to go back on June 19, 2016.   He still has some tingling and numbness in his fingers. He can button his shirt and write. He occasionally drops things. The numbness in his feet are gone. He says it's not that bad, it's just the tips of them. His wife hasn't noticed him struggling with anything major. He says he doesn't believe it's getting worse, but he is noticing it more since he has been trying to do more for himself lately. He is up and about. He is not staying confined to his bedroom any longer  He denies abdominal pain.  Appetite is improved.  Weight continues to improve.  Review of Systems  Constitutional: Negative.   HENT: Negative.   Eyes: Negative.   Respiratory: Negative.   Cardiovascular: Negative.   Gastrointestinal: Negative.  Negative for  abdominal pain.  Genitourinary: Negative.   Musculoskeletal: Negative.   Skin: Negative.   Neurological: Positive for tingling.       Tingling/numbness in fingers.   Endo/Heme/Allergies: Negative.   Psychiatric/Behavioral: Negative.   All other systems reviewed and are negative. 14 point review of systems was performed and is negative except as detailed under history of present illness and above   PAST MEDICAL HISTORY:   Past Medical History:  Diagnosis Date  . Adenocarcinoma of sigmoid colon ([place]) 10/18/2015  . Arthritis   . Diverticulitis   . Hypertension     ALLERGIES: No Known Allergies    MEDICATIONS: I have reviewed the patient's current medications.    Current Outpatient Prescriptions on File Prior to Visit  Medication Sig  Dispense Refill  . buPROPion (WELLBUTRIN XL) 300 MG 24 hr tablet Take 1 tablet (300 mg total) by mouth daily. 30 tablet 1  . clonazePAM (KLONOPIN) 1 MG tablet Take 1 tablet (1 mg total) by mouth at bedtime. 30 tablet 1  . granisetron (SANCUSO) 3.1 MG/24HR Apply to skin starting 24 hours before chemotherapy. Remove after 7 days. 1 each 5  . Heparin Lock Flush (HEPARIN FLUSH, PORCINE,) 100 UNIT/ML injection Flush port with 5cc of heparin lock flush. 10 Syringe 0  . ibuprofen (ADVIL,MOTRIN) 200 MG tablet Take 400 mg by mouth every 6 (six) hours as needed for mild pain or moderate pain.    Marland Kitchen lidocaine-prilocaine (EMLA) cream Apply a quarter size amount to port site 1 hour prior to chemo. Do not rub in. Cover with plastic wrap. 30 g 3  . omeprazole (PRILOSEC) 40 MG capsule Take 1 capsule (40 mg total) by mouth daily. 30 capsule 6  . OXALIPLATIN IV Inject into the vein. (chemo) To be given once every 14 days    . oxyCODONE-acetaminophen (PERCOCET) 7.5-325 MG tablet Take 1-2 tablets by mouth every 4 (four) hours as needed for moderate pain. 90 tablet 0  . prochlorperazine (COMPAZINE) 10 MG tablet Take 1 tablet (10 mg total) by mouth every 6 (six) hours as needed for nausea or vomiting. 90 tablet 6  . Sodium Chloride Flush (NORMAL SALINE FLUSH) 0.9 % SOLN Flush port with 10 ml of normal saline. 10 Syringe 0  . zolpidem (AMBIEN) 10 MG tablet Take 1 tablet (10 mg total) by mouth at bedtime as needed for sleep. 30 tablet 3   No current facility-administered medications on file prior to visit.      PAST SURGICAL HISTORY Past Surgical History:  Procedure Laterality Date  . BIOPSY  02/06/2016   Procedure: BIOPSY;  Surgeon: Daneil Dolin, MD;  [facility];  Service: Endoscopy;;  esophagus  . ESOPHAGEAL DILATION  02/06/2016   Procedure: ESOPHAGEAL DILATION;  Surgeon: Daneil Dolin, MD;  Location: [facility];  Service: Endoscopy;;  . ESOPHAGOGASTRODUODENOSCOPY N/A 02/06/2016   Procedure:  ESOPHAGOGASTRODUODENOSCOPY (EGD);  Surgeon: Daneil Dolin, MD;  Location: [facility];  Service: Endoscopy;  Laterality: N/A;  115  . PARTIAL COLECTOMY N/A 10/03/2015   Procedure: PARTIAL COLECTOMY;  Surgeon: Aviva Signs, MD;  Location: [facility] ORS;  Service: General;  Laterality: N/A;  . PORTACATH PLACEMENT N/A 11/18/2015   Procedure: INSERTION PORT-A-CATH LEFT SUBCLAVIAN;  Surgeon: Aviva Signs, MD;  Location: [facility] ORS;  Service: General;  Laterality: N/A;    FAMILY HISTORY: Family History  Problem Relation Age of Onset  . Diabetes Father    No known family history of cancer Mother alive at age 43- healthy  Father deceased at age of 63 from heart disease, complications of DM 42 son 67 years old- healthy 50 grandson 61 year old- healthy  SOCIAL HISTORY:  reports that he quit smoking about 30 years ago. His smoking use included Cigars and Pipe. He quit smokeless tobacco use about 10 months ago. His smokeless tobacco use included Snuff. He reports that he does not drink alcohol or use drugs.  He works at [employer].  He is married x 37 years.  He is [employer].  PERFORMANCE STATUS: The patient's performance status is 1 - Symptomatic but completely ambulatory  PHYSICAL EXAM: Most Recent Vital Signs:  Vitals with BMI 05/04/2016  Height   Weight 206 lbs 5 oz  BMI   Systolic 409  Diastolic 75  Pulse 79  Respirations 16     Physical Exam  Constitutional: He is oriented to person, place, and time. No distress.  Patient was able to get on exam table without assistance.   HENT:  Head: Normocephalic and atraumatic.  Mouth/Throat: Oropharynx is clear and moist.  Eyes: Conjunctivae and EOM are normal. Pupils are equal, round, and reactive to light. Right eye exhibits no discharge. Left eye exhibits no discharge.  Neck: Normal range of motion. Neck supple. No thyromegaly present.  Cardiovascular: Normal rate, regular rhythm and normal heart sounds.   Pulmonary/Chest: Effort normal and breath  sounds normal. No respiratory distress. He has no wheezes. He has no rales. He exhibits no tenderness.  Abdominal: Soft. Bowel sounds are normal. He exhibits no distension and no mass. There is no tenderness. There is no rebound and no guarding. No hernia.  Musculoskeletal: Normal range of motion. He exhibits no edema or deformity.  Neurological: He is alert and oriented to person, place, and time.  Skin: Skin is warm and dry.  Nursing note and vitals reviewed.   LABORATORY DATA:  I have reviewed the data as listed. Results for DEKLYN, GIBBON (MRN [id number]) as of 05/04/2016 07:54   Ref. Range 05/04/2016 09:03  Sodium Latest Ref Range: 135 - 145 mmol/L 137  Potassium Latest Ref Range: 3.5 - 5.1 mmol/L 3.5  Chloride Latest Ref Range: 101 - 111 mmol/L 105  CO2 Latest Ref Range: 22 - 32 mmol/L 25  BUN Latest Ref Range: 6 - 20 mg/dL 10  Creatinine Latest Ref Range: 0.61 - 1.24 mg/dL 1.06  Calcium Latest Ref Range: 8.9 - 10.3 mg/dL 9.0  EGFR (Non-African Amer.) Latest Ref Range: >60 mL/min >60  EGFR (African American) Latest Ref Range: >60 mL/min >60  Glucose Latest Ref Range: 65 - 99 mg/dL 170 (H)  Anion gap Latest Ref Range: 5 - 15  7  Alkaline Phosphatase Latest Ref Range: 38 - 126 U/L 123  Albumin Latest Ref Range: 3.5 - 5.0 g/dL 3.5  AST Latest Ref Range: 15 - 41 U/L 33  ALT Latest Ref Range: 17 - 63 U/L 18  Total Protein Latest Ref Range: 6.5 - 8.1 g/dL 6.2 (L)  Total Bilirubin Latest Ref Range: 0.3 - 1.2 mg/dL 0.8  WBC Latest Ref Range: 4.0 - 10.5 K/uL 8.0  RBC Latest Ref Range: 4.22 - 5.81 MIL/uL 4.24  Hemoglobin Latest Ref Range: 13.0 - 17.0 g/dL 13.8  HCT Latest Ref Range: 39.0 - 52.0 % 40.8  MCV Latest Ref Range: 78.0 - 100.0 fL 96.2  MCH Latest Ref Range: 26.0 - 34.0 pg 32.5  MCHC Latest Ref Range: 30.0 - 36.0 g/dL 33.8  RDW Latest Ref Range: 11.5 - 15.5 % 15.6 (H)  Platelets Latest Ref Range: 150 - 400 K/uL 115 (L)  Neutrophils Latest Units: % 70  Lymphocytes Latest  Units: % 16  Monocytes Relative Latest Units: % 12  Eosinophil Latest Units: % 2  Basophil Latest Units: % 0  NEUT# Latest Ref Range: 1.7 - 7.7 K/uL 5.7  Lymphocyte # Latest Ref Range: 0.7 - 4.0 K/uL 1.3  Monocyte # Latest Ref Range: 0.1 - 1.0 K/uL 0.9  Eosinophils Absolute Latest Ref Range: 0.0 - 0.7 K/uL 0.1  Basophils Absolute Latest Ref Range: 0.0 - 0.1 K/uL 0.0    Lab Results  Component Value Date   CEA 2.5 09/27/2015     RADIOGRAPHY: I have personally reviewed the radiological images as listed and agreed with the findings in the report. Study Result   CLINICAL DATA:  Patient with history of stage III colon cancer.  EXAM: CT ABDOMEN AND PELVIS WITH CONTRAST  TECHNIQUE: Multidetector CT imaging of the abdomen and pelvis was performed using the standard protocol following bolus administration of intravenous contrast.  CONTRAST:  180m ISOVUE-300 IOPAMIDOL (ISOVUE-300) INJECTION 61%  COMPARISON:  CT abdomen pelvis 09/09/2015.  FINDINGS: Lower chest: Normal heart size. No consolidative pulmonary opacities. No pleural effusion.  Hepatobiliary: Liver is normal in size and contour. Gallbladder is decompressed.  Pancreas: Unremarkable  Spleen: Unremarkable  Adrenals/Urinary Tract: The adrenal glands are normal. Kidneys enhance symmetrically with contrast. No hydronephrosis. Urinary bladder is decompressed.  Stomach/Bowel: Patient status post interval sigmoid colectomy. Anastomosis appears intact. There is surrounding soft tissue stranding at the anastomotic site. Additionally, anterior to the sigmoid colon there is a 1.6 cm soft tissue nodule (image 71; series 2). Oral contrast material throughout the large and small bowel without evidence for obstruction or perforation. Normal morphology of the stomach. Duodenum diverticulum.  Vascular/Lymphatic: Normal caliber abdominal aorta. Interval decrease in size of preaortic lymph node measuring 9 mm (image  50; series 2), previously 14 mm.  Reproductive: Heterogeneous enhancement of the prostate.  Other: None.  Musculoskeletal: Lumbar spine degenerative changes. No aggressive or acute appearing osseous lesions.  IMPRESSION: Patient status post interval sigmoid colectomy. Anastomosis appears intact. Slightly anterior to the sigmoid colon is a small soft tissue nodule which may be postsurgical in etiology. Recommend attention on followup.  Interval decrease in size of preaortic lymph node.   Electronically Signed   By: DLovey NewcomerM.D.   On: 04/05/2016 09:28       PATHOLOGY:    Diagnosis Colon, segmental resection, sigmoid - FULL THICKNESS INVASIVE MODERATELY TO POORLY DIFFERENTIATED ADENOCARCINOMA WITH SURFACE ULCERATION. 1 of 4 FINAL for [place] [phone] Diagnosis(continued) - TUMOR INVADES THROUGH MUSCULARIS MUCOSA AND PERFORATES SEROSAL SURFACE. - TUMOR MEASURES 10.3 CM IN GREATEST DIMENSION. - EXTENSIVE LYMPH/VASCULAR INVASION IS IDENTIFIED. - DISTAL MARGIN DEMONSTRATES ADENOCARCINOMA INVOLVING THE LYMPH/VASCULAR SPACES. - PROXIMAL MARGIN IS NEGATIVE. - ELEVEN OF TWELVE LYMPH NODES ARE POSITIVE FOR METASTATIC ADENOCARCINOMA (11/12). - EXTRACAPSULAR EXTENSION IS PRESENT. 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  Unfortunately  he is at high risk for recurrence and I strongly emphasized the need for adjuvant therapy. I would also recommend potential referral near the completion of therapy for HIPEC evaluation at [facility]. Kieren agreed to try FOLFOX after he was intolerant of XELOX. He is here today for ongoing therapy. He is finished after today. Will proceed with his final cycle today.   Labs reviewed with the patient in detail, documented in the note above.  He doesn't need any refills.   He was told he can go back to work whenever he wants. He wants to go back on January 2.    He will return for a follow up in 1 month. If he is doing well, then we will start seeing him every 3 months.  At follow-up we will discuss repeat C-scope and guidelines regarding survivorship.  All questions were answered. The patient knows to call the clinic with any problems, questions or concerns. We can certainly see the patient much sooner if necessary.  This document serves as a record of services personally performed by Ancil Linsey, MD. It was created on her behalf by Martinique Casey, a trained medical scribe. The creation of this record is based on the scribe's personal observations and the provider's statements to them. This document has been checked and approved by the attending provider.  I have reviewed the above documentation for accuracy and completeness, and I agree with the above.  This note is electronically signed [id number] Cyril Mourning, MD   05/04/2016 7:53 AM

## 2016-05-05 LAB — CEA: CEA: 7.5 ng/mL — ABNORMAL HIGH (ref 0.0–4.7)

## 2016-05-07 ENCOUNTER — Ambulatory Visit (HOSPITAL_COMMUNITY): Payer: BLUE CROSS/BLUE SHIELD

## 2016-05-08 ENCOUNTER — Encounter (HOSPITAL_BASED_OUTPATIENT_CLINIC_OR_DEPARTMENT_OTHER): Payer: BLUE CROSS/BLUE SHIELD

## 2016-05-08 VITALS — BP 136/90 | HR 100 | Temp 98.0°F | Resp 18

## 2016-05-08 DIAGNOSIS — Z5189 Encounter for other specified aftercare: Secondary | ICD-10-CM

## 2016-05-08 DIAGNOSIS — C187 Malignant neoplasm of sigmoid colon: Secondary | ICD-10-CM | POA: Diagnosis not present

## 2016-05-08 MED ORDER — PEGFILGRASTIM INJECTION 6 MG/0.6ML ~~LOC~~
6.0000 mg | PREFILLED_SYRINGE | Freq: Once | SUBCUTANEOUS | Status: AC
  Administered 2016-05-08: 6 mg via SUBCUTANEOUS
  Filled 2016-05-08: qty 0.6

## 2016-05-08 NOTE — Progress Notes (Signed)
Mauel Corgan presents today for injection per MD orders. Neulasta 6mg  administered SQ in right Abdomen. Administration without incident. Patient tolerated well.  Vitals stable and discharged home, ambulatory with wife.

## 2016-05-14 ENCOUNTER — Encounter (HOSPITAL_COMMUNITY): Payer: Self-pay | Admitting: Hematology & Oncology

## 2016-05-15 ENCOUNTER — Other Ambulatory Visit (HOSPITAL_COMMUNITY): Payer: Self-pay | Admitting: Hematology & Oncology

## 2016-05-15 DIAGNOSIS — F32A Depression, unspecified: Secondary | ICD-10-CM

## 2016-05-15 DIAGNOSIS — F329 Major depressive disorder, single episode, unspecified: Secondary | ICD-10-CM

## 2016-05-18 ENCOUNTER — Ambulatory Visit (HOSPITAL_COMMUNITY): Payer: BLUE CROSS/BLUE SHIELD

## 2016-05-21 ENCOUNTER — Ambulatory Visit (HOSPITAL_COMMUNITY): Payer: BLUE CROSS/BLUE SHIELD

## 2016-06-06 ENCOUNTER — Other Ambulatory Visit (HOSPITAL_COMMUNITY): Payer: BLUE CROSS/BLUE SHIELD

## 2016-06-06 ENCOUNTER — Ambulatory Visit (HOSPITAL_COMMUNITY): Payer: BLUE CROSS/BLUE SHIELD | Admitting: Oncology

## 2016-06-07 ENCOUNTER — Other Ambulatory Visit (HOSPITAL_COMMUNITY): Payer: Self-pay | Admitting: *Deleted

## 2016-06-07 DIAGNOSIS — C187 Malignant neoplasm of sigmoid colon: Secondary | ICD-10-CM

## 2016-06-08 ENCOUNTER — Encounter (HOSPITAL_COMMUNITY): Payer: BLUE CROSS/BLUE SHIELD

## 2016-06-08 ENCOUNTER — Other Ambulatory Visit (HOSPITAL_COMMUNITY): Payer: BLUE CROSS/BLUE SHIELD

## 2016-06-08 ENCOUNTER — Ambulatory Visit (HOSPITAL_COMMUNITY): Payer: BLUE CROSS/BLUE SHIELD | Admitting: Oncology

## 2016-06-08 ENCOUNTER — Encounter (HOSPITAL_COMMUNITY): Payer: Self-pay | Admitting: Oncology

## 2016-06-08 ENCOUNTER — Encounter (HOSPITAL_COMMUNITY): Payer: BLUE CROSS/BLUE SHIELD | Attending: Oncology | Admitting: Oncology

## 2016-06-08 VITALS — BP 117/94 | HR 106 | Temp 98.2°F | Resp 16 | Wt 201.7 lb

## 2016-06-08 DIAGNOSIS — Z87891 Personal history of nicotine dependence: Secondary | ICD-10-CM | POA: Diagnosis not present

## 2016-06-08 DIAGNOSIS — F329 Major depressive disorder, single episode, unspecified: Secondary | ICD-10-CM | POA: Diagnosis not present

## 2016-06-08 DIAGNOSIS — C187 Malignant neoplasm of sigmoid colon: Secondary | ICD-10-CM

## 2016-06-08 DIAGNOSIS — G629 Polyneuropathy, unspecified: Secondary | ICD-10-CM | POA: Insufficient documentation

## 2016-06-08 DIAGNOSIS — Z95828 Presence of other vascular implants and grafts: Secondary | ICD-10-CM

## 2016-06-08 DIAGNOSIS — G4700 Insomnia, unspecified: Secondary | ICD-10-CM | POA: Diagnosis not present

## 2016-06-08 DIAGNOSIS — G4701 Insomnia due to medical condition: Secondary | ICD-10-CM | POA: Diagnosis not present

## 2016-06-08 LAB — COMPREHENSIVE METABOLIC PANEL
ALBUMIN: 3.9 g/dL (ref 3.5–5.0)
ALK PHOS: 101 U/L (ref 38–126)
ALT: 26 U/L (ref 17–63)
ANION GAP: 8 (ref 5–15)
AST: 36 U/L (ref 15–41)
BUN: 16 mg/dL (ref 6–20)
CALCIUM: 9.4 mg/dL (ref 8.9–10.3)
CHLORIDE: 104 mmol/L (ref 101–111)
CO2: 24 mmol/L (ref 22–32)
Creatinine, Ser: 0.96 mg/dL (ref 0.61–1.24)
GFR calc non Af Amer: >60 mL/min (ref >60)
GLUCOSE: 177 mg/dL — AB (ref 65–99)
Potassium: 3.7 mmol/L (ref 3.5–5.1)
SODIUM: 136 mmol/L (ref 135–145)
Total Bilirubin: 0.8 mg/dL (ref 0.3–1.2)
Total Protein: 7.1 g/dL (ref 6.5–8.1)

## 2016-06-08 LAB — CBC WITH DIFFERENTIAL/PLATELET
BASOS PCT: 0 %
Basophils Absolute: 0 10*3/uL (ref 0.0–0.1)
EOS ABS: 0.2 10*3/uL (ref 0.0–0.7)
EOS PCT: 3 %
HCT: 45.5 % (ref 39.0–52.0)
HEMOGLOBIN: 15.6 g/dL (ref 13.0–17.0)
LYMPHS ABS: 1 10*3/uL (ref 0.7–4.0)
Lymphocytes Relative: 17 %
MCH: 32.5 pg (ref 26.0–34.0)
MCHC: 34.3 g/dL (ref 30.0–36.0)
MCV: 94.8 fL (ref 78.0–100.0)
MONOS PCT: 13 %
Monocytes Absolute: 0.8 10*3/uL (ref 0.1–1.0)
NEUTROS PCT: 67 %
Neutro Abs: 3.8 10*3/uL (ref 1.7–7.7)
PLATELETS: 141 10*3/uL — AB (ref 150–400)
RBC: 4.8 MIL/uL (ref 4.22–5.81)
RDW: 13.7 % (ref 11.5–15.5)
WBC: 5.7 10*3/uL (ref 4.0–10.5)

## 2016-06-08 MED ORDER — OMEPRAZOLE 40 MG PO CPDR: 40.0000 mg | DELAYED_RELEASE_CAPSULE | Freq: Every day | ORAL | 5 refills | Status: AC

## 2016-06-08 MED ORDER — HEPARIN SOD (PORK) LOCK FLUSH 100 UNIT/ML IV SOLN
500.0000 [IU] | Freq: Once | INTRAVENOUS | Status: AC
  Administered 2016-06-08: 500 [IU] via INTRAVENOUS
  Filled 2016-06-08: qty 5

## 2016-06-08 MED ORDER — OXYCODONE-ACETAMINOPHEN 7.5-325 MG PO TABS: 1.0000 | ORAL_TABLET | Freq: Four times a day (QID) | ORAL | 0 refills | Status: DC | PRN

## 2016-06-08 MED ORDER — SODIUM CHLORIDE 0.9% FLUSH
10.0000 mL | INTRAVENOUS | Status: AC | PRN
  Administered 2016-06-08: 10 mL via INTRAVENOUS
  Filled 2016-06-08: qty 10

## 2016-06-08 MED ORDER — ZOLPIDEM TARTRATE 10 MG PO TABS: 10.0000 mg | ORAL_TABLET | Freq: Every evening | ORAL | 3 refills | Status: AC | PRN

## 2016-06-08 MED ORDER — BUPROPION HCL ER (XL) 300 MG PO TB24: 300.0000 mg | ORAL_TABLET | Freq: Every day | ORAL | 5 refills | Status: DC

## 2016-06-08 NOTE — Progress Notes (Signed)
Derek Kaufmann, MD [place] [id number]  Adenocarcinoma of sigmoid colon ([place]) - Plan: zolpidem (AMBIEN) 10 MG tablet, oxyCODONE-acetaminophen (PERCOCET) 7.5-325 MG tablet, CBC with Differential, Comprehensive metabolic panel, CEA, CBC with Differential, Comprehensive metabolic panel, CBC with Differential, Comprehensive metabolic panel, CEA, CANCELED: CEA  Reactive depression - Plan: buPROPion (WELLBUTRIN XL) 300 MG 24 hr tablet  Insomnia due to medical condition - Plan: zolpidem (AMBIEN) 10 MG tablet  CURRENT THERAPY: Surveillance per NCCN guidelines  INTERVAL HISTORY: Derek Rodriguez 55 y.o. 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 Arnoldo Morale.    Adenocarcinoma of sigmoid colon ([place])   09/09/2015 - 09/12/2015 Hospital Admission    Diverticulitis of colon with perforation      09/12/2015 Imaging    CT abd/pelvis- Continued severe inflammation of the proximal half of the sigmoid colon. There is a 2.6 cm oval low-attenuation structure extending between the posterior wall of the inflamed sigmoid colon and the dome of the bladder....      09/27/2015 Tumor Marker    CEA: 2.5       10/03/2015 Procedure    Partial colectomy, splenic flexure takedown by Dr. Arnoldo Morale      10/03/2015 Pathology Results    FULL THICKNESS INVASIVE MODERATELY TO POORLY DIFFERENTIATED ADENOCARCINOMA WITH SURFACE ULCERATION. TUMOR INVADES THROUGH MUSCULARIS MUCOSA AND PERFORATES SEROSAL SURFACE. - TUMOR MEASURES 10.3 CM IN GREATEST DIMENSION. - EXTENSIVE LYMPH/VASCULAR INVASI      10/13/2015 Pathology Results    MSI STABLE      10/20/2015 Imaging    CT chest- Negative. No evidence of metastatic disease or other active disease within the thorax.      11/04/2015 - 11/04/2015 Chemotherapy    CapeOX      11/12/2015 Miscellaneous    ED visit with nausea and decreased po intake      11/18/2015 Procedure   Port-A-Cath insertion by Dr. Arnoldo Morale      11/25/2015 Treatment Plan Change    From Xelox to FOLFOX      11/25/2015 - 05/04/2016 Chemotherapy    FOLFOX x 11 cycles (after 1 cycle of CAPEOX) with significant complications throughout treatment      01/06/2016 Treatment Plan Change    Oxaliplatin dose reduced by 20% for cycle #4 and 5FU bolus was discontinued.         He is here today for follow-up after completing chemotherapy.  "I want this port out."  He is advised that we will keep for the next three months and this will be re-addressed at that time.  He needs refills on a number of medications and this is taken care of today.  I discussed referral to [facility] for consideration of HIPEC therapy.  At the initiation of this discussion he says "absolutely not."  He reports "I don't want any more surgeries or chemotherapy.  I'm done."  I asked him to listen and I provided him education regarding the role for this intervention.  He is reminded of his multiple satellite nodules at time of surgery and despite adjuvant chemotherapy reducing the risk of recurrence, he is STILL at high risk for recurrent disease.  The role of HIPEC is to reduce the risk even further.  He again confirms his disinterest in pursuing this treatment option.  He would like a letter to return to work.  He notes that he must get cleared by the company's nurse and MD.  He also will need to pass a FIT test.    Peripheral neuropathy of fingertips persists.  Review of Systems  Constitutional: Negative for chills, fever and weight loss.  HENT: Negative.   Eyes: Negative.   Respiratory: Negative.  Negative for cough.   Cardiovascular: Negative.  Negative for chest pain.  Gastrointestinal: Negative.   Genitourinary: Negative.   Musculoskeletal: Negative.   Skin: Negative.   Neurological: Positive for sensory change (tingling of fingertips) and weakness.  Endo/Heme/Allergies: Negative.   Psychiatric/Behavioral: Positive for  depression. The patient has insomnia.     Past Medical History:  Diagnosis Date  . Adenocarcinoma of sigmoid colon ([place]) 10/18/2015  . Arthritis   . Diverticulitis   . Hypertension     Past Surgical History:  Procedure Laterality Date  . BIOPSY  02/06/2016   Procedure: BIOPSY;  Surgeon: Daneil Dolin, MD;  [facility];  Service: Endoscopy;;  esophagus  . ESOPHAGEAL DILATION  02/06/2016   Procedure: ESOPHAGEAL DILATION;  Surgeon: Daneil Dolin, MD;  Location[facility];  Service: Endoscopy;;  . ESOPHAGOGASTRODUODENOSCOPY N/A 02/06/2016   Procedure: ESOPHAGOGASTRODUODENOSCOPY (EGD);  Surgeon: Daneil Dolin, MD;  Location: [facility];  Service: Endoscopy;  Laterality: N/A;  115  . PARTIAL COLECTOMY N/A 10/03/2015   Procedure: PARTIAL COLECTOMY;  Surgeon: Aviva Signs, MD;  Location: [facility] ORS;  Service: General;  Laterality: N/A;  . PORTACATH PLACEMENT N/A 11/18/2015   Procedure: INSERTION PORT-A-CATH LEFT SUBCLAVIAN;  Surgeon: Aviva Signs, MD;  Location: [facility] ORS;  Service: General;  Laterality: N/A;    Family History  Problem Relation Age of Onset  . Diabetes Father     Social History   Social History  . Marital status: Married    Spouse name: N/A  . Number of children: N/A  . Years of education: N/A   Social History Main Topics  . Smoking status: Former Smoker    Types: 66, Pipe    Quit date: 09/26/1985  . Smokeless tobacco: Former Systems developer    Types: Snuff    Quit date: 06/29/2015  . Alcohol use No     Comment: occ  . Drug use: No  . Sexual activity: Yes    Birth control/ protection: None   Other Topics Concern  . None   Social History Narrative  . None     PHYSICAL EXAMINATION  ECOG PERFORMANCE STATUS: 1 - Symptomatic but completely ambulatory  Vitals:   06/08/16 1213  BP: (!) 117/94  Pulse: (!) 106  Resp: 16  Temp: 98.2 F (36.8 C)    GENERAL:alert, no distress, comfortable, cooperative and accompanied by wife SKIN: skin color,  texture, turgor are normal, no rashes or significant lesions HEAD: Normocephalic, No masses, lesions, tenderness or abnormalities EYES: normal, EOMI, Conjunctiva are pink and non-injected EARS: External ears normal OROPHARYNX:lips, buccal mucosa, and tongue normal and mucous membranes are moist  NECK: supple, no adenopathy, trachea midline LYMPH:  no palpable lymphadenopathy BREAST:not examined LUNGS: clear to auscultation  HEART: regular rate & rhythm ABDOMEN:abdomen soft and normal bowel sounds BACK: Back symmetric, no curvature. EXTREMITIES:less then 2 second capillary refill, no joint deformities, effusion, or inflammation, no skin discoloration, no cyanosis  NEURO: alert & oriented x 3 with fluent speech, no focal motor/sensory deficits, gait normal   LABORATORY DATA: CBC    Component Value Date/Time   WBC 5.7 06/08/2016 1125   RBC 4.80 06/08/2016 1125   HGB 15.6 06/08/2016 1125   HCT 45.5 06/08/2016 1125  PLT 141 (L) 06/08/2016 1125   MCV 94.8 06/08/2016 1125   MCH 32.5 06/08/2016 1125   MCHC 34.3 06/08/2016 1125   RDW 13.7 06/08/2016 1125   LYMPHSABS 1.0 06/08/2016 1125   MONOABS 0.8 06/08/2016 1125   EOSABS 0.2 06/08/2016 1125   BASOSABS 0.0 06/08/2016 1125      Chemistry      Component Value Date/Time   NA 136 06/08/2016 1125   K 3.7 06/08/2016 1125   CL 104 06/08/2016 1125   CO2 24 06/08/2016 1125   BUN 16 06/08/2016 1125   CREATININE 0.96 06/08/2016 1125      Component Value Date/Time   CALCIUM 9.4 06/08/2016 1125   ALKPHOS 101 06/08/2016 1125   AST 36 06/08/2016 1125   ALT 26 06/08/2016 1125   BILITOT 0.8 06/08/2016 1125     Lab Results  Component Value Date   CEA 7.5 (H) 05/04/2016     PENDING LABS:   RADIOGRAPHIC STUDIES:  No results found.   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 S/P partial colectomy by Dr. Arnoldo Morale on 10/03/2015.  Followed by 6 months of adjuvant chemotherapy initially treatment with CAPEOX x 1 cycle followed by FOLFOX x 11 cycles with last treatment on 05/04/2016 (requiring significant dose reductions and complicated by significant intolerance/depression).  Oncology history updated.  Labs today with port flush: CBC diff, CMET, CEA.  I personally reviewed and went over laboratory results with the patient.  The results are noted within this dictation.    CEA last month was elevated at completion of treatment.  Pre-operative CEA was normal.  Recent elevation has unknown significance at this time.   We discussed referral to [facility] for consideration of HIPEC therapy to further reduce the risk of recurrence given his high risk disease.  He adamantly refuses even a consultation.  He is advised of the risks of foregoing this treatment modality.  Additionally, he is advised that if cancer returns, treatment choices will be very limited given his difficulty with FOLFOX therapy.  Labs in 3 months: CBC diff, CMET.  Port flushes every 6-8 weeks.  We will discuss timing of port removal at follow-up appointment in 3 months.  I have refilled the following medications: Omeprazole Wellbutrin Percocet Ambien  He notes that Clonazepam was not helpful for sleep and he would like to go back to Ambien as this was more effective for him.  He wants a letter to return to work.  Letter is composed, signed, and given to the patient today.  He needs to get cleared by the company's nurse/MD and FIT testing prior to re-employment.  Return in 3 months for follow-up.  We will discuss timing of repeat imaging and colonoscopy at that time.  Dr. Gala Romney is his  GI physician.   ORDERS PLACED FOR THIS ENCOUNTER: Orders Placed This Encounter  Procedures  . CBC with Differential  . Comprehensive metabolic panel  . CEA  . CBC with Differential  . Comprehensive metabolic panel    MEDICATIONS PRESCRIBED THIS ENCOUNTER: Meds ordered this encounter  Medications  . buPROPion (WELLBUTRIN XL) 300 MG 24 hr tablet    Sig: Take 1 tablet (300 mg total) by mouth  daily.    Dispense:  30 tablet    Refill:  5    Order Specific Question:   Supervising Provider    Answer:   Patrici Ranks [id number]  . omeprazole (PRILOSEC) 40 MG capsule    Sig: Take 1 capsule (40 mg total) by mouth daily.    Dispense:  30 capsule    Refill:  5    Order Specific Question:   Supervising Provider    Answer:   Patrici Ranks [id number]  . zolpidem (AMBIEN) 10 MG tablet    Sig: Take 1 tablet (10 mg total) by mouth at bedtime as needed for sleep.    Dispense:  30 tablet    Refill:  3    Order Specific Question:   Supervising Provider    Answer:   Patrici Ranks [id number]  . oxyCODONE-acetaminophen (PERCOCET) 7.5-325 MG tablet    Sig: Take 1 tablet by mouth every 6 (six) hours as needed for moderate pain.    Dispense:  60 tablet    Refill:  0    Order Specific Question:   Supervising Provider    Answer:   Patrici Ranks [id number]    THERAPY PLAN:  NCCN guidelines for surveillance for Colon cancer are as follows (1.2017):  A. Stage I   1. Colonoscopy at year 1    A. If advanced adenoma, repeat in 1 year    B. If no advanced adenoma, repeat in 3 years, and then every 5 years.  B. Stage II, Stage III   1. H+P every 3-6 months x 2 years and then every 6 months for a total of 5 years    2. CEA every 3-6 months x 2 years and then every 6 months for a total of 5 years    3. CT CAP every 6-12 months (category 2B for frequency < 12 months) for a total of 5 years .   4.  Colonoscopy in 1 year except if no preoperative colonoscopy due to obstructing lesion,  colonoscopy in 3-6 months.     A. If advanced adenoma, repeat in 1 year    B. If no advanced adenoma, repeat in 3 years, then every 5 years   5. PET/CT scan is not recommended.  C. Stage IV   1. H+P every 3-6 months x 2 years and then every 6 months for a total of 5 years    2. CEA every 3 months x 2 years and then every 6 months for a total of 3- 5 years    3. CT CAP every 3-6 months (category 2B for frequency < 6 months) x 2 years., then every 6-12 months for a total of 5 years .   4. Colonoscopy in 1 year except if no preoperative colonoscopy due to obstructing lesion, colonoscopy in 3-6 months.     A. If advanced adenoma, repeat in 1 year    B. If no advanced adenoma, repeat in 3 years, then every 5 years   All questions were answered. The patient knows to call the clinic with any problems, questions or concerns. We can certainly see the patient much sooner if necessary.  Patient and plan discussed with Dr. Ancil Linsey and she is in agreement with the aforementioned.   This note is electronically signed by: Doy Mince 06/08/2016 4:09 PM

## 2016-06-08 NOTE — Progress Notes (Signed)
Derek Rodriguez presented for Portacath access and flush. Portacath located left chest wall accessed with  H 20 needle. Good blood return present. Portacath flushed with 45ml NS and 500U/53ml Heparin and needle removed intact. Procedure without incident. Patient tolerated procedure well.

## 2016-06-08 NOTE — Patient Instructions (Addendum)
[place] at [facility] Discharge Instructions  RECOMMENDATIONS MADE BY THE CONSULTANT AND ANY TEST RESULTS WILL BE SENT TO YOUR REFERRING PHYSICIAN.  You saw Kirby Crigler, PA-C, today. Port flush with labs today. Port flush in 6 weeks. Follow up with labs in 3 months. Return to work note given. Ambien and Percocet refills given. See Amy at checkout for appointments.  Thank you for choosing [place] at [facility] to provide your oncology and hematology care.  To afford each patient quality time with our provider, please arrive at least 15 minutes before your scheduled appointment time.   Beginning January 23rd 2017 lab work for the [facility] will be done in the  Main lab at [facility] on 1st floor. If you have a lab appointment with the [place] please come in thru the  Main Entrance and check in at the main information desk  You need to re-schedule your appointment should you arrive 10 or more minutes late.  We strive to give you quality time with our providers, and arriving late affects you and other patients whose appointments are after yours.  Also, if you no show three or more times for appointments you may be dismissed from the clinic at the providers discretion.     Again, thank you for choosing [facility].  Our hope is that these requests will decrease the amount of time that you wait before being seen by our physicians.       _____________________________________________________________  Should you have questions after your visit to [facility], please contact our office at [phone] between the hours of 8:30 a.m. and 4:30 p.m.  Voicemails left after 4:30 p.m. will not be returned until the following business day.  For prescription refill requests, have your pharmacy contact our office.         Resources For Cancer Patients and their Caregivers ? American Cancer Society: Can assist  with transportation, wigs, general needs, runs Look Good Feel Better.        864-464-1579 ? Cancer Care: Provides financial assistance, online support groups, medication/co-pay assistance.  1-800-813-HOPE 253-465-6568) ? [facility] Assists [place] cancer patients and their families through emotional , educational and financial support.  325-282-5322 ? [place] DSS Where to apply for food stamps, Medicaid and utility assistance. 934-545-7785 ? RCATS: Transportation to medical appointments. (819) 291-2009 ? Social Security Administration: May apply for disability if have a Stage IV cancer. 786-228-6523 (904) 250-3884 ? [employer], Disability and Transit Services: Assists with nutrition, care and transit needs. [place] Support Programs: @10RELATIVEDAYS @ > Cancer Support Group  2nd Tuesday of the month 1pm-2pm, Journey Room  > Creative Journey  3rd Tuesday of the month 1130am-1pm, Journey Room  > Look Good Feel Better  1st Wednesday of the month 10am-12 noon, Journey Room (Call [place] to register [phone])

## 2016-06-08 NOTE — Assessment & Plan Note (Addendum)
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 S/P partial colectomy by Dr. Arnoldo Morale on 10/03/2015.  Followed by 6 months of adjuvant chemotherapy initially treatment with CAPEOX x 1 cycle followed by FOLFOX x 11 cycles with last treatment on 05/04/2016 (requiring significant dose reductions and complicated by significant intolerance/depression).  Oncology history updated.  Labs today with port flush: CBC diff, CMET, CEA.  I personally reviewed and went over laboratory results with the patient.  The results are noted within this dictation.    CEA last month was elevated at completion of treatment.  Pre-operative CEA was normal.  Recent elevation has unknown significance at this time.   We discussed referral to [facility] for consideration of HIPEC therapy to further reduce the risk of recurrence given his high risk disease.  He adamantly refuses even a consultation.  He is advised of the risks of foregoing this treatment modality.  Additionally, he is advised that if cancer returns, treatment choices will be very limited given his difficulty with FOLFOX therapy.  Labs in 3 months: CBC diff, CMET.  Port flushes every 6-8 weeks.  We will discuss timing of port removal at follow-up appointment in 3 months.  I have refilled the following medications: Omeprazole Wellbutrin Percocet Ambien  He notes that Clonazepam was not helpful for sleep and he would like to go back to Ambien as this was more effective for him.  He wants a letter to return to work.  Letter is composed, signed, and given to the patient today.  He needs to get cleared by the company's nurse/MD and FIT testing prior to re-employment.  Return  in 3 months for follow-up.  We will discuss timing of repeat imaging and colonoscopy at that time.  Dr. Gala Romney is his GI physician.

## 2016-06-09 LAB — CEA: CEA: 4.4 ng/mL (ref 0.0–4.7)

## 2016-06-11 ENCOUNTER — Encounter (HOSPITAL_COMMUNITY): Payer: Self-pay | Admitting: Hematology & Oncology

## 2016-06-29 ENCOUNTER — Other Ambulatory Visit (HOSPITAL_COMMUNITY): Payer: Self-pay | Admitting: *Deleted

## 2016-06-29 DIAGNOSIS — F329 Major depressive disorder, single episode, unspecified: Secondary | ICD-10-CM

## 2016-06-29 MED ORDER — BUPROPION HCL ER (XL) 300 MG PO TB24: 300.0000 mg | ORAL_TABLET | Freq: Every day | ORAL | 5 refills | Status: DC

## 2016-07-20 ENCOUNTER — Encounter (HOSPITAL_COMMUNITY): Payer: BLUE CROSS/BLUE SHIELD

## 2016-07-23 ENCOUNTER — Other Ambulatory Visit (HOSPITAL_COMMUNITY): Payer: Self-pay | Admitting: Hematology & Oncology

## 2016-07-23 ENCOUNTER — Telehealth (HOSPITAL_COMMUNITY): Payer: Self-pay

## 2016-07-23 DIAGNOSIS — F32A Depression, unspecified: Secondary | ICD-10-CM

## 2016-07-23 DIAGNOSIS — F329 Major depressive disorder, single episode, unspecified: Secondary | ICD-10-CM

## 2016-07-23 NOTE — Telephone Encounter (Signed)
Patients wife called stating patient has had an increase in anxiety over the last 3 weeks. He went back to work 3 weeks ago working 12 hour shifts. Wife states the day before he goes back to work he gets more anxious and walks the floor sometimes starting as early as 5:30 am stating "I can't take this". When he is off and wife is working he worries her to come home early. He wanted to go to ER last night for the anxiety. Wife refused to take him because of all the flu going around. She states she encourages him constantly. He usually texts her around 3 pm when working stating he doesn't think he can make it til pm. Wife will encourage him saying that "yes he can", "the day is almost over", and tells him to go back to work to get his mind off the anxiety. Wife states he is taking his Wellbutrin as directed. She also had him try an Ativan at work one afternoon and patient said it made his anxiety worse.  Reviewed with PA-C. He can be referred to psychology or follow up with his PCP. Notified wife. She isn't sure he will go to psychologist. She states she will talk to patient and let us know.

## 2016-07-23 NOTE — Telephone Encounter (Signed)
-----   Message from Gerhard Perches, RN sent at 07/23/2016 12:40 PM EST ----- Wife has questions about his wellbutrin

## 2016-07-26 ENCOUNTER — Encounter (HOSPITAL_COMMUNITY): Payer: BLUE CROSS/BLUE SHIELD | Attending: Oncology

## 2016-07-26 DIAGNOSIS — Z452 Encounter for adjustment and management of vascular access device: Secondary | ICD-10-CM | POA: Diagnosis not present

## 2016-07-26 DIAGNOSIS — M1990 Unspecified osteoarthritis, unspecified site: Secondary | ICD-10-CM | POA: Insufficient documentation

## 2016-07-26 DIAGNOSIS — Z833 Family history of diabetes mellitus: Secondary | ICD-10-CM | POA: Insufficient documentation

## 2016-07-26 DIAGNOSIS — D649 Anemia, unspecified: Secondary | ICD-10-CM | POA: Insufficient documentation

## 2016-07-26 DIAGNOSIS — K5792 Diverticulitis of intestine, part unspecified, without perforation or abscess without bleeding: Secondary | ICD-10-CM | POA: Insufficient documentation

## 2016-07-26 DIAGNOSIS — I10 Essential (primary) hypertension: Secondary | ICD-10-CM | POA: Insufficient documentation

## 2016-07-26 DIAGNOSIS — Z79899 Other long term (current) drug therapy: Secondary | ICD-10-CM | POA: Insufficient documentation

## 2016-07-26 DIAGNOSIS — C187 Malignant neoplasm of sigmoid colon: Secondary | ICD-10-CM | POA: Insufficient documentation

## 2016-07-26 DIAGNOSIS — Z9889 Other specified postprocedural states: Secondary | ICD-10-CM | POA: Insufficient documentation

## 2016-07-26 DIAGNOSIS — Z87891 Personal history of nicotine dependence: Secondary | ICD-10-CM | POA: Insufficient documentation

## 2016-07-26 MED ORDER — SODIUM CHLORIDE 0.9% FLUSH
10.0000 mL | INTRAVENOUS | Status: DC | PRN
  Administered 2016-07-26: 10 mL via INTRAVENOUS
  Filled 2016-07-26: qty 10

## 2016-07-26 MED ORDER — HEPARIN SOD (PORK) LOCK FLUSH 100 UNIT/ML IV SOLN
500.0000 [IU] | Freq: Once | INTRAVENOUS | Status: AC
  Administered 2016-07-26: 500 [IU] via INTRAVENOUS

## 2016-07-27 ENCOUNTER — Encounter (HOSPITAL_COMMUNITY): Payer: Self-pay

## 2016-07-27 ENCOUNTER — Telehealth (HOSPITAL_COMMUNITY): Payer: Self-pay

## 2016-07-27 NOTE — Patient Instructions (Signed)
[facility] at [facility] Discharge Instructions  RECOMMENDATIONS MADE BY THE CONSULTANT AND ANY TEST RESULTS WILL BE SENT TO YOUR REFERRING PHYSICIAN.  Port flush done  Follow up as scheduled.  Thank you for choosing [facility] at [facility] to provide your oncology and hematology care.  To afford each patient quality time with our provider, please arrive at least 15 minutes before your scheduled appointment time.    If you have a lab appointment with the Cancer Center please come in thru the  Main Entrance and check in at the main information desk  You need to re-schedule your appointment should you arrive 10 or more minutes late.  We strive to give you quality time with our providers, and arriving late affects you and other patients whose appointments are after yours.  Also, if you no show three or more times for appointments you may be dismissed from the clinic at the providers discretion.     Again, thank you for choosing [facility].  Our hope is that these requests will decrease the amount of time that you wait before being seen by our physicians.       _____________________________________________________________  Should you have questions after your visit to [facility], please contact our office at [phone] between the hours of 8:30 a.m. and 4:30 p.m.  Voicemails left after 4:30 p.m. will not be returned until the following business day.  For prescription refill requests, have your pharmacy contact our office.       Resources For Cancer Patients and their Caregivers ? American Cancer Society: Can assist with transportation, wigs, general needs, runs Look Good Feel Better.        1-888-227-6333 ? Cancer Care: Provides financial assistance, online support groups, medication/co-pay assistance.  1-800-813-HOPE (4673) ? [facility] Assists [place] cancer patients and their  families through emotional , educational and financial support.  336-427-4357 ? [place] DSS Where to apply for food stamps, Medicaid and utility assistance. 336-342-1394 ? RCATS: Transportation to medical appointments. 336-347-2287 ? Social Security Administration: May apply for disability if have a Stage IV cancer. 336-342-7796 1-800-772-1213 ? [place] Aging, Disability and Transit Services: Assists with nutrition, care and transit needs. 336-349-2343  Cancer Center Support Programs: @10RELATIVEDAYS@ > Cancer Support Group  2nd Tuesday of the month 1pm-2pm, Journey Room  > Creative Journey  3rd Tuesday of the month 1130am-1pm, Journey Room  > Look Good Feel Better  1st Wednesday of the month 10am-12 noon, Journey Room (Call [employer] to register 1-800-395-5775)   

## 2016-07-27 NOTE — Progress Notes (Signed)
Derek Rodriguez presented for Portacath access and flush. Portacath located left chest wall accessed with  H 20 needle. Good blood return present. Portacath flushed with 34ml NS and 500U/8ml Heparin and needle removed intact. Procedure without incident. Patient tolerated procedure well. Vitals stable and discharged from clinic ambulatory. Follow up as scheduled.

## 2016-07-27 NOTE — Telephone Encounter (Signed)
Called patients wife, per message received via email and let her know that we can refer patient to Dr. Merlene Laughter for a nerve conduction study in regards to his neuropathy. Also, pt was wanting anti- anxiety meds when he gets really anxious - Gershon Mussel says with his history of anxiety - that he thinks he needs a psychiatric eval and let them try to get his anxiety under control. They are much more qualified to treat his anxiety than we are. He also needs to be sure that he goes back to his PCP. Explained all of the above to wife. She wants to talk with patient first and also with a NP she trusts where she works. She states she will get back with [place].

## 2016-09-06 ENCOUNTER — Other Ambulatory Visit (HOSPITAL_COMMUNITY): Payer: BLUE CROSS/BLUE SHIELD

## 2016-09-06 ENCOUNTER — Ambulatory Visit (HOSPITAL_COMMUNITY): Payer: BLUE CROSS/BLUE SHIELD | Admitting: Hematology & Oncology

## 2016-09-07 ENCOUNTER — Other Ambulatory Visit (HOSPITAL_COMMUNITY): Payer: BLUE CROSS/BLUE SHIELD

## 2016-09-07 ENCOUNTER — Ambulatory Visit (HOSPITAL_COMMUNITY): Payer: BLUE CROSS/BLUE SHIELD

## 2016-09-14 ENCOUNTER — Other Ambulatory Visit (HOSPITAL_COMMUNITY): Payer: Self-pay | Admitting: Oncology

## 2016-09-14 DIAGNOSIS — G620 Drug-induced polyneuropathy: Secondary | ICD-10-CM

## 2016-09-14 MED ORDER — GABAPENTIN 300 MG PO CAPS: ORAL_CAPSULE | ORAL | 1 refills | Status: AC

## 2016-09-21 ENCOUNTER — Encounter (HOSPITAL_COMMUNITY): Payer: BLUE CROSS/BLUE SHIELD

## 2016-09-25 ENCOUNTER — Inpatient Hospital Stay (HOSPITAL_COMMUNITY)
Admission: EM | Admit: 2016-09-25 | Discharge: 2016-09-27 | DRG: 445 | Disposition: A | Discharge status: Home / Self Care | Payer: BLUE CROSS/BLUE SHIELD | Attending: Internal Medicine | Admitting: Internal Medicine

## 2016-09-25 ENCOUNTER — Telehealth (HOSPITAL_COMMUNITY): Payer: Self-pay

## 2016-09-25 ENCOUNTER — Emergency Department (HOSPITAL_COMMUNITY): Payer: BLUE CROSS/BLUE SHIELD

## 2016-09-25 ENCOUNTER — Encounter (HOSPITAL_COMMUNITY): Payer: Self-pay | Admitting: Emergency Medicine

## 2016-09-25 DIAGNOSIS — R590 Localized enlarged lymph nodes: Secondary | ICD-10-CM | POA: Diagnosis present

## 2016-09-25 DIAGNOSIS — F419 Anxiety disorder, unspecified: Secondary | ICD-10-CM | POA: Diagnosis present

## 2016-09-25 DIAGNOSIS — Z8679 Personal history of other diseases of the circulatory system: Secondary | ICD-10-CM | POA: Diagnosis not present

## 2016-09-25 DIAGNOSIS — Z7189 Other specified counseling: Secondary | ICD-10-CM | POA: Diagnosis not present

## 2016-09-25 DIAGNOSIS — G629 Polyneuropathy, unspecified: Secondary | ICD-10-CM | POA: Diagnosis present

## 2016-09-25 DIAGNOSIS — Z95828 Presence of other vascular implants and grafts: Secondary | ICD-10-CM | POA: Diagnosis not present

## 2016-09-25 DIAGNOSIS — K219 Gastro-esophageal reflux disease without esophagitis: Secondary | ICD-10-CM | POA: Diagnosis present

## 2016-09-25 DIAGNOSIS — R1013 Epigastric pain: Secondary | ICD-10-CM | POA: Diagnosis not present

## 2016-09-25 DIAGNOSIS — Z9049 Acquired absence of other specified parts of digestive tract: Secondary | ICD-10-CM

## 2016-09-25 DIAGNOSIS — K5792 Diverticulitis of intestine, part unspecified, without perforation or abscess without bleeding: Secondary | ICD-10-CM | POA: Diagnosis present

## 2016-09-25 DIAGNOSIS — M1990 Unspecified osteoarthritis, unspecified site: Secondary | ICD-10-CM | POA: Diagnosis present

## 2016-09-25 DIAGNOSIS — R1010 Upper abdominal pain, unspecified: Secondary | ICD-10-CM

## 2016-09-25 DIAGNOSIS — K8000 Calculus of gallbladder with acute cholecystitis without obstruction: Secondary | ICD-10-CM | POA: Diagnosis present

## 2016-09-25 DIAGNOSIS — F329 Major depressive disorder, single episode, unspecified: Secondary | ICD-10-CM | POA: Diagnosis present

## 2016-09-25 DIAGNOSIS — Z79899 Other long term (current) drug therapy: Secondary | ICD-10-CM | POA: Diagnosis not present

## 2016-09-25 DIAGNOSIS — Z9221 Personal history of antineoplastic chemotherapy: Secondary | ICD-10-CM

## 2016-09-25 DIAGNOSIS — C785 Secondary malignant neoplasm of large intestine and rectum: Secondary | ICD-10-CM | POA: Diagnosis present

## 2016-09-25 DIAGNOSIS — C187 Malignant neoplasm of sigmoid colon: Secondary | ICD-10-CM | POA: Diagnosis present

## 2016-09-25 DIAGNOSIS — K810 Acute cholecystitis: Secondary | ICD-10-CM | POA: Diagnosis present

## 2016-09-25 DIAGNOSIS — R112 Nausea with vomiting, unspecified: Secondary | ICD-10-CM | POA: Diagnosis present

## 2016-09-25 DIAGNOSIS — Z515 Encounter for palliative care: Secondary | ICD-10-CM | POA: Diagnosis present

## 2016-09-25 DIAGNOSIS — Z87891 Personal history of nicotine dependence: Secondary | ICD-10-CM | POA: Diagnosis not present

## 2016-09-25 DIAGNOSIS — R970 Elevated carcinoembryonic antigen [CEA]: Secondary | ICD-10-CM | POA: Diagnosis present

## 2016-09-25 DIAGNOSIS — F32A Depression, unspecified: Secondary | ICD-10-CM | POA: Diagnosis present

## 2016-09-25 DIAGNOSIS — C189 Malignant neoplasm of colon, unspecified: Secondary | ICD-10-CM | POA: Diagnosis present

## 2016-09-25 LAB — PROTIME-INR
INR: 1.04
Prothrombin Time: 13.6 seconds (ref 11.4–15.2)

## 2016-09-25 LAB — CBC
HEMATOCRIT: 43.7 % (ref 39.0–52.0)
HEMOGLOBIN: 15.3 g/dL (ref 13.0–17.0)
MCH: 29.9 pg (ref 26.0–34.0)
MCHC: 35 g/dL (ref 30.0–36.0)
MCV: 85.4 fL (ref 78.0–100.0)
Platelets: 228 10*3/uL (ref 150–400)
RBC: 5.12 MIL/uL (ref 4.22–5.81)
RDW: 13.5 % (ref 11.5–15.5)
WBC: 11.6 10*3/uL — AB (ref 4.0–10.5)

## 2016-09-25 LAB — LIPASE, BLOOD: LIPASE: 19 U/L (ref 11–51)

## 2016-09-25 LAB — HEPATIC FUNCTION PANEL
ALBUMIN: 3.9 g/dL (ref 3.5–5.0)
ALK PHOS: 90 U/L (ref 38–126)
ALT: 20 U/L (ref 17–63)
AST: 26 U/L (ref 15–41)
Bilirubin, Direct: 0.1 mg/dL (ref 0.1–0.5)
Indirect Bilirubin: 0.6 mg/dL (ref 0.3–0.9)
TOTAL PROTEIN: 6.9 g/dL (ref 6.5–8.1)
Total Bilirubin: 0.7 mg/dL (ref 0.3–1.2)

## 2016-09-25 LAB — BASIC METABOLIC PANEL
ANION GAP: 8 (ref 5–15)
BUN: 21 mg/dL — ABNORMAL HIGH (ref 6–20)
CALCIUM: 9.2 mg/dL (ref 8.9–10.3)
CO2: 28 mmol/L (ref 22–32)
Chloride: 102 mmol/L (ref 101–111)
Creatinine, Ser: 1.07 mg/dL (ref 0.61–1.24)
GFR calc Af Amer: >60 mL/min (ref >60)
Glucose, Bld: 117 mg/dL — ABNORMAL HIGH (ref 65–99)
POTASSIUM: 4.1 mmol/L (ref 3.5–5.1)
SODIUM: 138 mmol/L (ref 135–145)

## 2016-09-25 LAB — TROPONIN I

## 2016-09-25 MED ORDER — PANTOPRAZOLE SODIUM 40 MG PO TBEC
40.0000 mg | DELAYED_RELEASE_TABLET | Freq: Every day | ORAL | Status: DC
  Administered 2016-09-26 – 2016-09-27 (×2): 40 mg via ORAL
  Filled 2016-09-25 (×2): qty 1

## 2016-09-25 MED ORDER — HEPARIN SOD (PORK) LOCK FLUSH 100 UNIT/ML IV SOLN
500.0000 [IU] | Freq: Three times a day (TID) | INTRAVENOUS | Status: DC | PRN
  Filled 2016-09-25: qty 5

## 2016-09-25 MED ORDER — HEPARIN SODIUM (PORCINE) 5000 UNIT/ML IJ SOLN
5000.0000 [IU] | Freq: Three times a day (TID) | INTRAMUSCULAR | Status: DC
  Administered 2016-09-25 – 2016-09-26 (×4): 5000 [IU] via SUBCUTANEOUS
  Filled 2016-09-25 (×4): qty 1

## 2016-09-25 MED ORDER — FENTANYL CITRATE (PF) 100 MCG/2ML IJ SOLN
50.0000 ug | INTRAMUSCULAR | Status: DC | PRN
  Administered 2016-09-25 – 2016-09-26 (×4): 50 ug via INTRAVENOUS
  Filled 2016-09-25 (×4): qty 2

## 2016-09-25 MED ORDER — PIPERACILLIN-TAZOBACTAM 3.375 G IVPB
3.3750 g | Freq: Three times a day (TID) | INTRAVENOUS | Status: DC
  Administered 2016-09-25 – 2016-09-27 (×5): 3.375 g via INTRAVENOUS
  Filled 2016-09-25 (×5): qty 50

## 2016-09-25 MED ORDER — GI COCKTAIL ~~LOC~~
30.0000 mL | Freq: Once | ORAL | Status: AC
  Administered 2016-09-25: 30 mL via ORAL
  Filled 2016-09-25: qty 30

## 2016-09-25 MED ORDER — PIPERACILLIN-TAZOBACTAM 3.375 G IVPB 30 MIN
3.3750 g | Freq: Once | INTRAVENOUS | Status: AC
  Administered 2016-09-25: 3.375 g via INTRAVENOUS
  Filled 2016-09-25: qty 50

## 2016-09-25 MED ORDER — HYDROMORPHONE HCL 1 MG/ML IJ SOLN
1.0000 mg | Freq: Once | INTRAMUSCULAR | Status: AC
  Administered 2016-09-25: 1 mg via INTRAVENOUS
  Filled 2016-09-25: qty 1

## 2016-09-25 MED ORDER — ALBUTEROL SULFATE (2.5 MG/3ML) 0.083% IN NEBU: 2.5000 mg | INHALATION_SOLUTION | RESPIRATORY_TRACT | Status: DC | PRN

## 2016-09-25 MED ORDER — CLONAZEPAM 0.5 MG PO TABS
1.0000 mg | ORAL_TABLET | Freq: Every day | ORAL | Status: DC
  Administered 2016-09-25 – 2016-09-26 (×2): 1 mg via ORAL
  Filled 2016-09-25 (×2): qty 2

## 2016-09-25 MED ORDER — LIDOCAINE-PRILOCAINE 2.5-2.5 % EX CREA
TOPICAL_CREAM | Freq: Once | CUTANEOUS | Status: DC
  Filled 2016-09-25: qty 5

## 2016-09-25 MED ORDER — MORPHINE SULFATE (PF) 2 MG/ML IV SOLN
2.0000 mg | INTRAVENOUS | Status: DC | PRN
  Administered 2016-09-25: 2 mg via INTRAVENOUS
  Filled 2016-09-25: qty 1

## 2016-09-25 MED ORDER — SODIUM CHLORIDE 0.9 % IV BOLUS (SEPSIS)
500.0000 mL | Freq: Once | INTRAVENOUS | Status: AC
  Administered 2016-09-25: 500 mL via INTRAVENOUS

## 2016-09-25 MED ORDER — ONDANSETRON HCL 4 MG/2ML IJ SOLN
4.0000 mg | Freq: Four times a day (QID) | INTRAMUSCULAR | Status: DC | PRN
Start: 2016-09-25 — End: 2016-09-27
  Administered 2016-09-25: 4 mg via INTRAVENOUS
  Filled 2016-09-25: qty 2

## 2016-09-25 MED ORDER — IOPAMIDOL (ISOVUE-300) INJECTION 61%
100.0000 mL | Freq: Once | INTRAVENOUS | Status: AC | PRN
  Administered 2016-09-25: 100 mL via INTRAVENOUS

## 2016-09-25 MED ORDER — BUPROPION HCL ER (XL) 150 MG PO TB24
150.0000 mg | ORAL_TABLET | Freq: Every day | ORAL | Status: DC
  Administered 2016-09-27: 150 mg via ORAL
  Filled 2016-09-25 (×2): qty 1

## 2016-09-25 MED ORDER — SODIUM CHLORIDE 0.9 % IV SOLN
INTRAVENOUS | Status: AC
  Administered 2016-09-26: 09:00:00 via INTRAVENOUS

## 2016-09-25 NOTE — Telephone Encounter (Signed)
Left message notifying patients wife that the gabapentin should not be causing his nausea and vomiting. Be sure he is taking it with food. Call cancer center if any further needs or concerns.

## 2016-09-25 NOTE — Progress Notes (Signed)
Pharmacy Antibiotic Note  Derek Rodriguez is a 56 y.o. male admitted on 09/25/2016 with intra abdominal infection.  Pharmacy has been consulted for ZOSYN dosing.  Plan: Zosyn 3.375g IV q8h (4 hour infusion).  Monitor labs, progress, c/s  Height: 6' (182.9 cm) Weight: 215 lb (97.5 kg) IBW/kg (Calculated) : 77.6  Temp (24hrs), Avg:97.8 F (36.6 C), Min:97.8 F (36.6 C), Max:97.8 F (36.6 C)   Recent Labs Lab 09/25/16 1118  WBC 11.6*  CREATININE 1.07    Estimated Creatinine Clearance: 93.3 mL/min (by C-G formula based on SCr of 1.07 mg/dL).    No Known Allergies  Antimicrobials this admission: Zosyn 4/10 >>   Dose adjustments this admission:  Microbiology results:  BCx: pending  UCx: pending   Sputum: pending   MRSA PCR:   Thank you for allowing pharmacy to be a part of this patient's care.  Derek Rodriguez A 09/25/2016 3:04 PM

## 2016-09-25 NOTE — ED Triage Notes (Signed)
Patient c/o epigastric pain x3 days with nausea and vomiting. Denies any shortness of breath, weakness, or dizziness. Per patient no BM in past 3 days which is not normal for him. Patient has hx of colon cancer in which he finished chemo in November. Patient reports pressure and heaviness-states "feels like a knot." Per patient believes he had CT in past indicating a hiatal hernia but has not had any complications with it.

## 2016-09-25 NOTE — ED Provider Notes (Signed)
[facility] DEPT Provider Note   CSN: [id number] Arrival date & time: 09/25/16  1043   By signing my name below, I, Eunice Blase, attest that this documentation has been prepared under the direction and in the presence of Sherwood Gambler, MD. Electronically signed, Eunice Blase, ED Scribe. 09/25/16. 12:08 PM.   History   Chief Complaint Chief Complaint  Patient presents with  . Abdominal Pain   The history is provided by the patient and medical records. No language interpreter was used.    Derek Rodriguez is a 56 y.o. male with h/o acute diverticulitis, partial colectomy, hiatal hernia and colon cancer, self transported to the Emergency Department with concern for a progressively worsening lower sternal "knot". Pt describes 8/10, sore, intermittent lower sternal pain that became constant last night following a vomiting spell; is improved with sitting forward, belching and vomiting; worsened with laying down flat. Pt notes associated constipation, nausea that has subsided, vomiting, mild back pain, flatulence, belching. Pt notes milder, similar symptoms during chemotherapy. Wife states pt finished chemotherapy for colon cancer ~04/2016. She adds the pt had quit protonics and welbutrin until this past week. She states the pt's neuropathy has worsened in this time frame. She notes the pt was started on neurontin 3 days ago and his vomiting episode occurred after she gave the pt 3 doses of neurontin. Pt denies diarrhea, cough, SOB, hematemesis  Past Medical History:  Diagnosis Date  . 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 Arthritis   . Diverticulitis   . Hypertension     Patient Active Problem List   Diagnosis Date Noted  . Acute cholecystitis 09/25/2016  . Hypokalemia 02/17/2016  . Fatigue 02/17/2016  . Anemia 02/17/2016  . Esophageal dysphagia 02/06/2016  . Dehydration 12/30/2015  . Depression 12/15/2015  . Insomnia 11/15/2015  . Adenocarcinoma of sigmoid colon  ([place]) 10/18/2015  . S/P partial colectomy 10/03/2015  . Protein-calorie malnutrition, severe 09/10/2015  . Acute diverticulitis 09/09/2015  . Diverticulitis of colon with perforation 09/09/2015  . Colonic diverticular abscess 09/09/2015    Past Surgical History:  Procedure Laterality Date  . BIOPSY  02/06/2016   Procedure: BIOPSY;  Surgeon: Daneil Dolin, MD;  Location: [facility];  Service: Endoscopy;;  esophagus  . ESOPHAGEAL DILATION  02/06/2016   Procedure: ESOPHAGEAL DILATION;  Surgeon: Daneil Dolin, MD;  Location: [facility];  Service: Endoscopy;;  . ESOPHAGOGASTRODUODENOSCOPY N/A 02/06/2016   Procedure: ESOPHAGOGASTRODUODENOSCOPY (EGD);  Surgeon: Daneil Dolin, MD;  Location: [facility];  Service: Endoscopy;  Laterality: N/A;  115  . PARTIAL COLECTOMY N/A 10/03/2015   Procedure: PARTIAL COLECTOMY;  Surgeon: Aviva Signs, MD;  Location: [facility] ORS;  Service: General;  Laterality: N/A;  . PORTACATH PLACEMENT N/A 11/18/2015   Procedure: INSERTION PORT-A-CATH LEFT SUBCLAVIAN;  Surgeon: Aviva Signs, MD;  Location: [facility] ORS;  Service: General;  Laterality: N/A;       Home Medications    Prior to Admission medications   Medication Sig Start Date End Date Taking? Authorizing Provider  gabapentin (NEURONTIN) 300 MG capsule Take 1 capsule at HS x 3 days, then 2 capsules at HS x 3 days, and then 3 capsules at HS thereafter 09/14/16  Yes Manon Hilding Kefalas, PA-C  ibuprofen (ADVIL,MOTRIN) 200 MG tablet Take 400 mg by mouth every 6 (six) hours as needed for mild pain or moderate pain.   Yes Historical Provider, MD  omeprazole (PRILOSEC) 40 MG capsule Take 1 capsule (40 mg total) by mouth daily. 06/08/16  Yes Baird Cancer, PA-C  zolpidem (AMBIEN) 10 MG tablet Take 1 tablet (10 mg total) by mouth at bedtime as needed for sleep. 06/08/16  Yes Manon Hilding Kefalas, PA-C  buPROPion (WELLBUTRIN XL) 150 MG 24 hr tablet TAKE 1 TABLET (150 MG TOTAL) BY MOUTH DAILY. Patient not taking: Reported on  09/25/2016 07/23/16   Baird Cancer, PA-C  buPROPion (WELLBUTRIN XL) 300 MG 24 hr tablet Take 1 tablet (300 mg total) by mouth daily. Patient not taking: Reported on 09/25/2016 06/29/16   Patrici Ranks, MD  clonazePAM (KLONOPIN) 1 MG tablet Take 1 mg by mouth at bedtime. 05/21/16   Historical Provider, MD  escitalopram (LEXAPRO) 20 MG tablet FOR THE FIRST 3 DAYS TAKE 1/2 TABLET. THEN TAKE 1 TABLET DAILY THEREAFTER. Patient not taking: Reported on 09/25/2016 05/16/16   Patrici Ranks, MD  Heparin Lock Flush (HEPARIN FLUSH, PORCINE,) 100 UNIT/ML injection Flush port with 5cc of heparin lock flush. 03/02/16   Patrici Ranks, MD  lidocaine-prilocaine (EMLA) cream Apply a quarter size amount to port site 1 hour prior to chemo. Do not rub in. Cover with plastic wrap. Patient not taking: Reported on 09/25/2016 11/15/15   Patrici Ranks, MD  oxyCODONE-acetaminophen (PERCOCET) 7.5-325 MG tablet Take 1 tablet by mouth every 6 (six) hours as needed for moderate pain. 06/08/16   Baird Cancer, PA-C  prochlorperazine (COMPAZINE) 10 MG tablet Take 1 tablet (10 mg total) by mouth every 6 (six) hours as needed for nausea or vomiting. Patient not taking: Reported on 09/25/2016 01/06/16   Patrici Ranks, MD  Sodium Chloride Flush (NORMAL SALINE FLUSH) 0.9 % SOLN Flush port with 10 ml of normal saline. 01/13/16   Patrici Ranks, MD    Family History Family History  Problem Relation Age of Onset  . Diabetes Father     Social History Social History  Substance Use Topics  . Smoking status: Former Smoker    Types: 14, Pipe    Quit date: 09/26/1985  . Smokeless tobacco: Former Systems developer    Types: Snuff    Quit date: 06/29/2015  . Alcohol use No     Comment: occ     Allergies   Patient has no known allergies.   Review of Systems Review of Systems  Respiratory: Negative for cough and shortness of breath.   Cardiovascular: Positive for chest pain.  Gastrointestinal: Positive for  constipation, nausea and vomiting. Negative for diarrhea.  Musculoskeletal: Positive for back pain.  All other systems reviewed and are negative.  Physical Exam Updated Vital Signs BP 126/88 (BP Location: Left Arm)   Pulse 75   Temp 97.8 F (36.6 C) (Oral)   Resp 20   Ht 6' (1.829 m)   Wt 215 lb (97.5 kg)   SpO2 100%   BMI 29.16 kg/m   Physical Exam  Constitutional: He is oriented to person, place, and time. He appears well-developed and well-nourished.  HENT:  Head: Normocephalic and atraumatic.  Right Ear: External ear normal.  Left Ear: External ear normal.  Nose: Nose normal.  Eyes: Right eye exhibits no discharge. Left eye exhibits no discharge.  Neck: Neck supple.  Cardiovascular: Normal rate, regular rhythm and normal heart sounds.   Pulmonary/Chest: Effort normal and breath sounds normal. He exhibits tenderness (2cm firm subcutaneous mass at inferior portion of sternum).  Abdominal: Soft. There is tenderness in the right upper quadrant, epigastric area and left upper quadrant.  Musculoskeletal: He exhibits no edema.  Neurological:  He is alert and oriented to person, place, and time.  Skin: Skin is warm and dry.  Nursing note and vitals reviewed.    ED Treatments / Results  DIAGNOSTIC STUDIES: Oxygen Saturation is 100% on RA, normal by my interpretation.    COORDINATION OF CARE: 12:00 PM Discussed treatment plan with pt at bedside and pt agreed to plan. Will order CT and GI cocktail.  Labs (all labs ordered are listed, but only abnormal results are displayed) Labs Reviewed  BASIC METABOLIC PANEL - Abnormal; Notable for the following:       Result Value   Glucose, Bld 117 (*)    BUN 21 (*)    All other components within normal limits  CBC - Abnormal; Notable for the following:    WBC 11.6 (*)    All other components within normal limits  CULTURE, BLOOD (ROUTINE X 2)  CULTURE, BLOOD (ROUTINE X 2)  LIPASE, BLOOD  HEPATIC FUNCTION PANEL  TROPONIN I  CEA    PROTIME-INR    EKG  EKG Interpretation  Date/Time:  Tuesday September 25 2016 11:04:33 EDT Ventricular Rate:  67 PR Interval:    QRS Duration: 103 QT Interval:  411 QTC Calculation: 434 R Axis:   -4 Text Interpretation:  Sinus rhythm wandering baseline no acute ST/T changes no significant change compared to Aug 2017 Confirmed by Regenia Skeeter MD, Michel Hendon [phone]) on 09/25/2016 11:48:17 AM       Radiology Dg Chest 2 View  Result Date: 09/25/2016 CLINICAL DATA:  Upper abdominal pain, nausea/vomiting EXAM: CHEST  2 VIEW COMPARISON:  01/30/2016 FINDINGS: Lungs are clear.  No pleural effusion or pneumothorax. The heart is normal in size. Left chest port terminates in the mid SVC. IMPRESSION: No evidence of acute cardiopulmonary disease. Electronically Signed   By: Julian Hy M.D.   On: 09/25/2016 13:00   Ct Abdomen Pelvis W Contrast  Result Date: 09/25/2016 CLINICAL DATA:  Epigastric pain for 3 days, some nausea and vomiting, history of carcinoma of the colon with surgery and chemotherapy EXAM: CT ABDOMEN AND PELVIS WITH CONTRAST TECHNIQUE: Multidetector CT imaging of the abdomen and pelvis was performed using the standard protocol following bolus administration of intravenous contrast. CONTRAST:  162mL ISOVUE-300 IOPAMIDOL (ISOVUE-300) INJECTION 61% COMPARISON:  CT abdomen pelvis of 04/04/2016 FINDINGS: Lower chest: The lung bases are clear. The heart is within normal limits in size. Hepatobiliary: The liver enhances with no focal abnormality and no ductal dilatation is seen. The gallbladder however is somewhat distended and there does appear to be gallbladder wall thickening and pericholecystic strandiness worrisome for acute cholecystitis. Correlate clinically. No calcified gallstones are evident. The common bile duct is not dilated. No intrahepatic ductal dilatation is seen. If confirmation of acute cholecystitis is necessary ultrasound or nuclear medicine hepatobiliary scan may be helpful.  Pancreas: The pancreas is normal in size. The pancreatic duct is not dilated. The peripancreatic fat planes are well preserved. Spleen: The spleen is inhomogeneous but within normal limits. A small splenule is noted medially and inferiorly. Adrenals/Urinary Tract: The adrenal glands appear normal. The kidneys enhance with no calculus or mass. On delayed images, the pelvocaliceal systems are unremarkable. The urinary bladder is not optimally distended. However there is abnormal higher attenuation material along the right superior posterior aspect urinary bladder worrisome for tumor. Some of this attenuation could be due to contrast, but the asymmetry is worrisome for possible bladder tumor. Stomach/Bowel: The stomach is not well distended. No small bowel distention is seen. By history  the patient has had partial resection of the sigmoid colon. At the site of resection, there is mucosal nodular thickening. This could be due to acute diverticulitis, but recurrence of tumor is also a consideration. Just anterior to this site of surgery there is a lobular soft tissue mass of 3.7 x 2.9 cm very suspicious for recurrence of tumor, previously measuring 16 mm in diameter. There is feces throughout the more proximal colon. The terminal ileum is unremarkable. Vascular/Lymphatic: The abdominal aorta is normal in caliber. However, there is retroperitoneal adenopathy. Some of the larger nodes are adjacent to the aorta as on image 34 series 2 with a node measuring 17 mm in short axis diameter. On image 37 series 2 there is a node 16 mm in short axis diameter. Adenopathy also is noted in the pelvis near the area of previous resection and reanastomosis again worrisome for recurrence of carcinoma. A node on image 64 series 2 measures 15 mm. A right external iliac node on image 76 measures 2.3 cm in diameter. Reproductive: The prostate gland is normal in size. Other: A new soft tissue nodule anteriorly within the left abdomen on image  50 measures 8 mm could indicate peritoneal spread of disease. In addition there is nodular soft tissue within the right lower quadrant near the cecum again suspicious for peritoneal spread of tumor. Musculoskeletal: No lytic or blastic bone lesion is seen IMPRESSION: 1. Distended gallbladder with thickened wall and pericholecystic strandiness worrisome for acute cholecystitis. Correlate clinically. 2. Enlarging soft tissue mass adjacent to the area of the anastomosis of the rectosigmoid colon worrisome for recurrence of tumor. She adjacent adenopathy with retroperitoneal adenopathy noted as well. 3. Higher attenuation within the superior aspect of the urinary bladder to the right of midline worrisome for tumor. 4. There are a few small soft tissue nodules in the peritoneal cavity worrisome for peritoneal spread of disease. Electronically Signed   By: Ivar Drape M.D.   On: 09/25/2016 13:43   US Abdomen Limited Ruq  Result Date: 09/25/2016 CLINICAL DATA:  Upper abdominal pain for 3 days EXAM: US ABDOMEN LIMITED - RIGHT UPPER QUADRANT COMPARISON:  CT scan 09/25/2016 FINDINGS: Gallbladder: Small layering gallbladder sludge noted. No shadowing gallstones are noted within gallbladder. There is thickening of gallbladder wall up to 3.5 mm. Positive sonographic Murphy sign. Findings are highly suspicious for acute cholecystitis and clinical correlation is necessary. Common bile duct: Diameter: 2.3 mm in diameter within normal limits. Liver: No focal lesion identified. Within normal limits in parenchymal echogenicity. IMPRESSION: Small layering gallbladder sludge noted. No shadowing gallstones are noted within gallbladder. There is thickening of gallbladder wall up to 3.5 mm. Positive sonographic Murphy sign. Findings are highly suspicious for acute cholecystitis and clinical correlation is necessary. Normal CBD. Electronically Signed   By: Lahoma Crocker M.D.   On: 09/25/2016 14:44    Procedures Procedures (including  critical care time)  Medications Ordered in ED Medications  morphine 2 MG/ML injection 2 mg (not administered)  0.9 %  sodium chloride infusion (not administered)  piperacillin-tazobactam (ZOSYN) IVPB 3.375 g (not administered)  sodium chloride 0.9 % bolus 500 mL (500 mLs Intravenous New Bag/Given 09/25/16 1216)  gi cocktail (Maalox,Lidocaine,Donnatal) (30 mLs Oral Given 09/25/16 1214)  iopamidol (ISOVUE-300) 61 % injection 100 mL (100 mLs Intravenous Contrast Given 09/25/16 1300)  piperacillin-tazobactam (ZOSYN) IVPB 3.375 g (3.375 g Intravenous New Bag/Given 09/25/16 1427)  HYDROmorphone (DILAUDID) injection 1 mg (1 mg Intravenous Given 09/25/16 1427)     Initial Impression /  Assessment and Plan / ED Course  I have reviewed the triage vital signs and the nursing notes.  Pertinent labs & imaging results that were available during my care of the patient were reviewed by me and considered in my medical decision making (see chart for details).  Clinical Course as of Sep 26 1515  Tue Sep 25, 2016  1202 Given prior abd surgery with vomiting, will get CT to r/o obstruction or new masses. Could be gastric in nature, will try GI cocktail. I think chest/ACS is less likely, will send troponin.  [SG]    Clinical Course User Index [SG] Sherwood Gambler, MD    Patient's "knot" is probably his xiphoid process but it does feel odd. CT is concerning for acute cholecystitis. This would correlate with his symptoms. Pain minimally better with GI cocktail, much better after IV Dilaudid. Ultrasound confirms the cholecystitis. Discussed with surgery, Dr. Arnoldo Morale, who will consult on patient but given his past history and what appears to be a recurrence of cancer, asks for medicine admission and will also need an oncology consult while inpatient. Keep nothing by mouth and will start IV antibiotics. Does not appear severely ill or septic.  Final Clinical Impressions(s) / ED Diagnoses   Final diagnoses:  Upper  abdominal pain  Acute cholecystitis    New Prescriptions New Prescriptions   No medications on file   I personally performed the services described in this documentation, which was scribed in my presence. The recorded information has been reviewed and is accurate.    Sherwood Gambler, MD 09/25/16 [phone]

## 2016-09-25 NOTE — Telephone Encounter (Signed)
Wife called stating since patient went up on dosage of neurontin to 3 pills nightly he has had indigestion every night ans vomited last night. She wants to know if the neurontin could be causing this. Notified wife I would review with provider and call her back. She verbalized understanding.

## 2016-09-25 NOTE — H&P (Signed)
TRH H&P   Patient Demographics:    Derek Rodriguez, is a 56 y.o. male  MRN: [id number]   DOB - 1961/05/02  Admit Date - 09/25/2016  Outpatient Primary MD for the patient is [name], MD  Outpatient Specialists: Dr Arnoldo Morale, Dr Whitney Muse    Patient coming from: Home  Chief Complaint  Patient presents with  . Abdominal Pain      HPI:    Derek Rodriguez  is a 56 y.o. male, , Diverticulitis, no carcinoma of the colon status post partial resection of colon followed by chemotherapy all of which finished in November 2017. Patient was doing well and was scheduled for outpatient routine oncology follow-up this week however for the last 3 days he started experiencing some nausea and vomiting along with sharp nonradiating epigastric abdominal pain mostly related to having meals, symptoms worse at night, last night around 2 AM he had multiple episodes of emesis after which she came to the ER this morning where his CT scan of the abdomen along with right upper quadrant ultrasound suggest acute cholecystitis along with suspicion for colon cancer reoccurrence. General surgery was consulted and I was requested to admit the patient.  Her and she denies any fever or chills, no headache chest pain, no cough shortness of breath, no focal weakness, no unintentional weight loss in the last 1-2 months. Denies any blood in stool or urine or diarrhea.    Review of systems:    In addition to the HPI above,   No Fever-chills, No Headache, No changes with Vision or hearing, No problems swallowing food or Liquids, No Chest pain, Cough or Shortness of Breath, GI symptoms as above, No Blood in stool or Urine, No dysuria, No new skin rashes  or bruises, No new joints pains-aches,  No new weakness, tingling, numbness in any extremity, No recent weight gain or loss, No polyuria, polydypsia or polyphagia, No significant Mental Stressors.  A full 10 point Review of Systems was done, except as stated above, all other Review of Systems were negative.   With Past History of the following :    Past Medical History:  Diagnosis Date  . 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 Arthritis   . Diverticulitis   . Hypertension       Past Surgical History:  Procedure Laterality Date  .  BIOPSY  02/06/2016   Procedure: BIOPSY;  Surgeon: Daneil Dolin, MD;  Location: [facility];  Service: Endoscopy;;  esophagus  . ESOPHAGEAL DILATION  02/06/2016   Procedure: ESOPHAGEAL DILATION;  Surgeon: Daneil Dolin, MD;  Location: [facility];  Service: Endoscopy;;  . ESOPHAGOGASTRODUODENOSCOPY N/A 02/06/2016   Procedure: ESOPHAGOGASTRODUODENOSCOPY (EGD);  Surgeon: Daneil Dolin, MD;  Location: [facility];  Service: Endoscopy;  Laterality: N/A;  115  . PARTIAL COLECTOMY N/A 10/03/2015   Procedure: PARTIAL COLECTOMY;  Surgeon: Aviva Signs, MD;  Location: [facility];  Service: General;  Laterality: N/A;  . PORTACATH PLACEMENT N/A 11/18/2015   Procedure: INSERTION PORT-A-CATH LEFT SUBCLAVIAN;  Surgeon: Aviva Signs, MD;  Location: [facility];  Service: General;  Laterality: N/A;      Social History:     Social History  Substance Use Topics  . Smoking status: Former Smoker    Types: 69, Pipe    Quit date: 09/26/1985  . Smokeless tobacco: Former Systems developer    Types: Snuff    Quit date: 06/29/2015  . Alcohol use No     Comment: occ         Family History :     Family History  Problem Relation Age of Onset  . Diabetes Father        Home Medications:   Prior to Admission medications   Medication Sig Start Date End Date Taking? Authorizing Provider  gabapentin (NEURONTIN) 300 MG capsule Take 1 capsule at HS x 3 days, then 2  capsules at HS x 3 days, and then 3 capsules at HS thereafter 09/14/16  Yes Manon Hilding Kefalas, PA-C  ibuprofen (ADVIL,MOTRIN) 200 MG tablet Take 400 mg by mouth every 6 (six) hours as needed for mild pain or moderate pain.   Yes Historical Provider, MD  omeprazole (PRILOSEC) 40 MG capsule Take 1 capsule (40 mg total) by mouth daily. 06/08/16  Yes Baird Cancer, PA-C  zolpidem (AMBIEN) 10 MG tablet Take 1 tablet (10 mg total) by mouth at bedtime as needed for sleep. 06/08/16  Yes Manon Hilding Kefalas, PA-C  buPROPion (WELLBUTRIN XL) 150 MG 24 hr tablet TAKE 1 TABLET (150 MG TOTAL) BY MOUTH DAILY. Patient not taking: Reported on 09/25/2016 07/23/16   Baird Cancer, PA-C  buPROPion (WELLBUTRIN XL) 300 MG 24 hr tablet Take 1 tablet (300 mg total) by mouth daily. Patient not taking: Reported on 09/25/2016 06/29/16   Patrici Ranks, MD  clonazePAM (KLONOPIN) 1 MG tablet Take 1 mg by mouth at bedtime. 05/21/16   Historical Provider, MD  escitalopram (LEXAPRO) 20 MG tablet FOR THE FIRST 3 DAYS TAKE 1/2 TABLET. THEN TAKE 1 TABLET DAILY THEREAFTER. Patient not taking: Reported on 09/25/2016 05/16/16   Patrici Ranks, MD  Heparin Lock Flush (HEPARIN FLUSH, PORCINE,) 100 UNIT/ML injection Flush port with 5cc of heparin lock flush. 03/02/16   Patrici Ranks, MD  lidocaine-prilocaine (EMLA) cream Apply a quarter size amount to port site 1 hour prior to chemo. Do not rub in. Cover with plastic wrap. Patient not taking: Reported on 09/25/2016 11/15/15   Patrici Ranks, MD  oxyCODONE-acetaminophen (PERCOCET) 7.5-325 MG tablet Take 1 tablet by mouth every 6 (six) hours as needed for moderate pain. 06/08/16   Baird Cancer, PA-C  prochlorperazine (COMPAZINE) 10 MG tablet Take 1 tablet (10 mg total) by mouth every 6 (six) hours as needed for nausea or vomiting. Patient not taking: Reported on 09/25/2016 01/06/16   Larene Beach  Elio Forget, MD  Sodium Chloride Flush (NORMAL SALINE FLUSH) 0.9 % SOLN Flush port with 10 ml of  normal saline. 01/13/16   Patrici Ranks, MD     Allergies:    No Known Allergies   Physical Exam:   Vitals  Blood pressure 120/80, pulse 66, temperature 97.8 F (36.6 C), temperature source Oral, resp. rate 17, height 6' (1.829 m), weight 97.5 kg (215 lb), SpO2 95 %.   1. General Middle-aged white male lying in bed in NAD,    2. Normal affect and insight, Not Suicidal or Homicidal, Awake Alert, Oriented X 3.  3. No F.N deficits, ALL C.Nerves Intact, Strength 5/5 all 4 extremities, Sensation intact all 4 extremities, Plantars down going.  4. Ears and Eyes appear Normal, Conjunctivae clear, PERRLA. Moist Oral Mucosa.  5. Supple Neck, No JVD, No cervical lymphadenopathy appriciated, No Carotid Bruits.  6. Symmetrical Chest wall movement, Good air movement bilaterally, CTAB. Left subclavian Port-A-Cath site appears stable,  7. RRR, No Gallops, Rubs or Murmurs, No Parasternal Heave.  8. Positive Bowel Sounds, Abdomen Soft, mild epigastric tenderness, No organomegaly appriciated,No rebound -guarding or rigidity.  9.  No Cyanosis, Normal Skin Turgor, No Skin Rash or Bruise.  10. Good muscle tone,  joints appear normal , no effusions, Normal ROM.  11. No Palpable Lymph Nodes in Neck or Axillae      Data Review:    CBC  Recent Labs Lab 09/25/16 1118  WBC 11.6*  HGB 15.3  HCT 43.7  PLT 228  MCV 85.4  MCH 29.9  MCHC 35.0  RDW 13.5   ------------------------------------------------------------------------------------------------------------------  Chemistries   Recent Labs Lab 09/25/16 1118  NA 138  K 4.1  CL 102  CO2 28  GLUCOSE 117*  BUN 21*  CREATININE 1.07  CALCIUM 9.2  AST 26  ALT 20  ALKPHOS 90  BILITOT 0.7   ------------------------------------------------------------------------------------------------------------------ estimated creatinine clearance is 93.3 mL/min (by C-G formula based on SCr of 1.07  mg/dL). ------------------------------------------------------------------------------------------------------------------ No results for input(s): TSH, T4TOTAL, T3FREE, THYROIDAB in the last 72 hours.  Invalid input(s): FREET3  Coagulation profile No results for input(s): INR, PROTIME in the last 168 hours. ------------------------------------------------------------------------------------------------------------------- No results for input(s): DDIMER in the last 72 hours. -------------------------------------------------------------------------------------------------------------------  Cardiac Enzymes  Recent Labs Lab 09/25/16 1118  TROPONINI <0.03   ------------------------------------------------------------------------------------------------------------------    Component Value Date/Time   BNP 19.0 02/03/2016 0915     ---------------------------------------------------------------------------------------------------------------  Urinalysis    Component Value Date/Time   COLORURINE YELLOW 11/09/2015 2045   APPEARANCEUR CLEAR 11/09/2015 2045   LABSPEC >1.030 (H) 11/09/2015 2045   PHURINE 5.5 11/09/2015 2045   GLUCOSEU NEGATIVE 11/09/2015 2045   HGBUR NEGATIVE 11/09/2015 2045   BILIRUBINUR NEGATIVE 11/09/2015 2045   KETONESUR NEGATIVE 11/09/2015 2045   PROTEINUR NEGATIVE 11/09/2015 2045   NITRITE NEGATIVE 11/09/2015 2045   LEUKOCYTESUR NEGATIVE 11/09/2015 2045    ----------------------------------------------------------------------------------------------------------------   Imaging Results:    Dg Chest 2 View  Result Date: 09/25/2016 CLINICAL DATA:  Upper abdominal pain, nausea/vomiting EXAM: CHEST  2 VIEW COMPARISON:  01/30/2016 FINDINGS: Lungs are clear.  No pleural effusion or pneumothorax. The heart is normal in size. Left chest port terminates in the mid SVC. IMPRESSION: No evidence of acute cardiopulmonary disease. Electronically Signed   By: Julian Hy M.D.   On: 09/25/2016 13:00   Ct Abdomen Pelvis W Contrast  Result Date: 09/25/2016 CLINICAL DATA:  Epigastric pain for 3 days, some nausea and vomiting, history of carcinoma of  the colon with surgery and chemotherapy EXAM: CT ABDOMEN AND PELVIS WITH CONTRAST TECHNIQUE: Multidetector CT imaging of the abdomen and pelvis was performed using the standard protocol following bolus administration of intravenous contrast. CONTRAST:  172mL ISOVUE-300 IOPAMIDOL (ISOVUE-300) INJECTION 61% COMPARISON:  CT abdomen pelvis of 04/04/2016 FINDINGS: Lower chest: The lung bases are clear. The heart is within normal limits in size. Hepatobiliary: The liver enhances with no focal abnormality and no ductal dilatation is seen. The gallbladder however is somewhat distended and there does appear to be gallbladder wall thickening and pericholecystic strandiness worrisome for acute cholecystitis. Correlate clinically. No calcified gallstones are evident. The common bile duct is not dilated. No intrahepatic ductal dilatation is seen. If confirmation of acute cholecystitis is necessary ultrasound or nuclear medicine hepatobiliary scan may be helpful. Pancreas: The pancreas is normal in size. The pancreatic duct is not dilated. The peripancreatic fat planes are well preserved. Spleen: The spleen is inhomogeneous but within normal limits. A small splenule is noted medially and inferiorly. Adrenals/Urinary Tract: The adrenal glands appear normal. The kidneys enhance with no calculus or mass. On delayed images, the pelvocaliceal systems are unremarkable. The urinary bladder is not optimally distended. However there is abnormal higher attenuation material along the right superior posterior aspect urinary bladder worrisome for tumor. Some of this attenuation could be due to contrast, but the asymmetry is worrisome for possible bladder tumor. Stomach/Bowel: The stomach is not well distended. No small bowel distention is seen. By history  the patient has had partial resection of the sigmoid colon. At the site of resection, there is mucosal nodular thickening. This could be due to acute diverticulitis, but recurrence of tumor is also a consideration. Just anterior to this site of surgery there is a lobular soft tissue mass of 3.7 x 2.9 cm very suspicious for recurrence of tumor, previously measuring 16 mm in diameter. There is feces throughout the more proximal colon. The terminal ileum is unremarkable. Vascular/Lymphatic: The abdominal aorta is normal in caliber. However, there is retroperitoneal adenopathy. Some of the larger nodes are adjacent to the aorta as on image 34 series 2 with a node measuring 17 mm in short axis diameter. On image 37 series 2 there is a node 16 mm in short axis diameter. Adenopathy also is noted in the pelvis near the area of previous resection and reanastomosis again worrisome for recurrence of carcinoma. A node on image 64 series 2 measures 15 mm. A right external iliac node on image 76 measures 2.3 cm in diameter. Reproductive: The prostate gland is normal in size. Other: A new soft tissue nodule anteriorly within the left abdomen on image 50 measures 8 mm could indicate peritoneal spread of disease. In addition there is nodular soft tissue within the right lower quadrant near the cecum again suspicious for peritoneal spread of tumor. Musculoskeletal: No lytic or blastic bone lesion is seen IMPRESSION: 1. Distended gallbladder with thickened wall and pericholecystic strandiness worrisome for acute cholecystitis. Correlate clinically. 2. Enlarging soft tissue mass adjacent to the area of the anastomosis of the rectosigmoid colon worrisome for recurrence of tumor. She adjacent adenopathy with retroperitoneal adenopathy noted as well. 3. Higher attenuation within the superior aspect of the urinary bladder to the right of midline worrisome for tumor. 4. There are a few small soft tissue nodules in the peritoneal cavity  worrisome for peritoneal spread of disease. Electronically Signed   By: Ivar Drape M.D.   On: 09/25/2016 13:43   US Abdomen Limited Ruq  Result Date: 09/25/2016 CLINICAL DATA:  Upper abdominal pain for 3 days EXAM: US ABDOMEN LIMITED - RIGHT UPPER QUADRANT COMPARISON:  CT scan 09/25/2016 FINDINGS: Gallbladder: Small layering gallbladder sludge noted. No shadowing gallstones are noted within gallbladder. There is thickening of gallbladder wall up to 3.5 mm. Positive sonographic Murphy sign. Findings are highly suspicious for acute cholecystitis and clinical correlation is necessary. Common bile duct: Diameter: 2.3 mm in diameter within normal limits. Liver: No focal lesion identified. Within normal limits in parenchymal echogenicity. IMPRESSION: Small layering gallbladder sludge noted. No shadowing gallstones are noted within gallbladder. There is thickening of gallbladder wall up to 3.5 mm. Positive sonographic Murphy sign. Findings are highly suspicious for acute cholecystitis and clinical correlation is necessary. Normal CBD. Electronically Signed   By: Lahoma Crocker M.D.   On: 09/25/2016 14:44    My personal review of EKG: Rhythm NSR,  no Acute ST changes   Assessment & Plan:     1. Possible mild acute cholecystitis. Admit, nothing by mouth except medications, IV fluids and pain medications, empiric Zosyn, surgery to follow. Patient will be low to moderate risk for adverse cardiopulmonary outcome. He is fairly active and currently has no known cardiac issues.  2. History of adenocarcinoma of the colon status post colon resection in April 2017 followed by 5 months of chemotherapy finished in November 2017. CT scan suspicious for reoccurrence and metastasis. Oncology will be requested to evaluate as well. Surgery as above.  3. History of anxiety depression. Home medications continued.  4. GERD. On PPI   History of hypertension, this has resolved after he lost a lot of weight last year due to his  cancer and he is currently on no medications.   DVT Prophylaxis Heparin    AM Labs Ordered, also please review Full Orders  Family Communication: Admission, patients condition and plan of care including tests being ordered have been discussed with the patient and wife who indicate understanding and agree with the plan and Code Status.  Code Status Full  Likely DC to  Home 2-3 days  Condition Fair  Consults called: Surgery    Admission status: Inpt    Time spent in minutes : 35   Lala Lund M.D on 09/25/2016 at 3:14 PM  Between 7am to 7pm - Pager - [phone] ( page via [facility], text pages only, please mention full 10 digit call back number).  After 7pm go to [email or web address] - password [facility]  [facility] - Office  [phone]

## 2016-09-25 NOTE — Telephone Encounter (Signed)
No.  Should not be from Gabapentin.  Make sure he takes it with food, not on an empty stomach. Brenn Gatton, PA-C 09/25/2016 4:10 PM

## 2016-09-25 NOTE — ED Notes (Signed)
Ultrasound at bedside at this time.

## 2016-09-26 ENCOUNTER — Encounter (HOSPITAL_COMMUNITY): Payer: Self-pay | Admitting: Primary Care

## 2016-09-26 DIAGNOSIS — Z515 Encounter for palliative care: Secondary | ICD-10-CM

## 2016-09-26 DIAGNOSIS — K810 Acute cholecystitis: Principal | ICD-10-CM

## 2016-09-26 DIAGNOSIS — Z7189 Other specified counseling: Secondary | ICD-10-CM

## 2016-09-26 DIAGNOSIS — C187 Malignant neoplasm of sigmoid colon: Secondary | ICD-10-CM

## 2016-09-26 LAB — CBC
HCT: 42.9 % (ref 39.0–52.0)
Hemoglobin: 14.9 g/dL (ref 13.0–17.0)
MCH: 30 pg (ref 26.0–34.0)
MCHC: 34.7 g/dL (ref 30.0–36.0)
MCV: 86.5 fL (ref 78.0–100.0)
PLATELETS: 223 10*3/uL (ref 150–400)
RBC: 4.96 MIL/uL (ref 4.22–5.81)
RDW: 13.8 % (ref 11.5–15.5)
WBC: 10.8 10*3/uL — AB (ref 4.0–10.5)

## 2016-09-26 LAB — COMPREHENSIVE METABOLIC PANEL
ALT: 16 U/L — AB (ref 17–63)
AST: 21 U/L (ref 15–41)
Albumin: 3.6 g/dL (ref 3.5–5.0)
Alkaline Phosphatase: 80 U/L (ref 38–126)
Anion gap: 7 (ref 5–15)
BUN: 19 mg/dL (ref 6–20)
CHLORIDE: 103 mmol/L (ref 101–111)
CO2: 26 mmol/L (ref 22–32)
CREATININE: 1.03 mg/dL (ref 0.61–1.24)
Calcium: 8.8 mg/dL — ABNORMAL LOW (ref 8.9–10.3)
GFR calc Af Amer: >60 mL/min (ref >60)
GFR calc non Af Amer: >60 mL/min (ref >60)
Glucose, Bld: 109 mg/dL — ABNORMAL HIGH (ref 65–99)
Potassium: 3.6 mmol/L (ref 3.5–5.1)
Sodium: 136 mmol/L (ref 135–145)
Total Bilirubin: 1.2 mg/dL (ref 0.3–1.2)
Total Protein: 6.5 g/dL (ref 6.5–8.1)

## 2016-09-26 LAB — CEA: CEA: 7 ng/mL — ABNORMAL HIGH (ref 0.0–4.7)

## 2016-09-26 LAB — HIV ANTIBODY (ROUTINE TESTING W REFLEX): HIV Screen 4th Generation wRfx: NONREACTIVE

## 2016-09-26 MED ORDER — SENNOSIDES-DOCUSATE SODIUM 8.6-50 MG PO TABS
2.0000 | ORAL_TABLET | Freq: Every day | ORAL | Status: DC
  Administered 2016-09-26: 2 via ORAL
  Filled 2016-09-26: qty 2

## 2016-09-26 MED ORDER — LORAZEPAM 0.5 MG PO TABS: 0.5000 mg | ORAL_TABLET | Freq: Every evening | ORAL | Status: DC | PRN

## 2016-09-26 MED ORDER — BISACODYL 10 MG RE SUPP: 10.0000 mg | Freq: Every day | RECTAL | Status: DC | PRN

## 2016-09-26 MED ORDER — OXYCODONE-ACETAMINOPHEN 5-325 MG PO TABS
1.0000 | ORAL_TABLET | ORAL | Status: DC | PRN
  Administered 2016-09-27: 1 via ORAL
  Filled 2016-09-26: qty 1

## 2016-09-26 MED ORDER — HYDROMORPHONE HCL 1 MG/ML IJ SOLN
1.0000 mg | INTRAMUSCULAR | Status: DC | PRN
  Administered 2016-09-26 (×2): 1 mg via INTRAVENOUS
  Filled 2016-09-26 (×3): qty 1

## 2016-09-26 NOTE — Progress Notes (Signed)
PROGRESS NOTE    Derek Rodriguez  XIP:[id number] DOB: 1961/04/25 DOA: 09/25/2016 PCP: [name], MD    Brief Narrative: Derek Rodriguez  is a 56 y.o. male, , Diverticulitis, no carcinoma of the colon status post partial resection of colon followed by chemotherapy all of which finished in November 2017. Patient was doing well and was scheduled for outpatient routine oncology follow-up this week however for the last 3 days he started experiencing some nausea and vomiting along with sharp nonradiating epigastric abdominal pain mostly related to having meals, CT shows acute cholecytitis along with colon cancer re occurrence. gen surgery consulted.   Assessment & Plan:   Principal Problem:   Acute cholecystitis Active Problems:   S/P partial colectomy   Adenocarcinoma of sigmoid colon (HCC)   Depression  Nausea, vomiting and abdominal pain possibly from acute cholecystitis: - admit for IV antibiotics. gen surgery consulted and recommendations given.  - IV pain control, hydrate and monitor on diet.    ? Re occurrence of colon cancer: History of adenocarcinoma of the colon status post colon resection in April 2017 followed by 5 months of chemotherapy finished in November 2017 Elevated CEA levels.  Oncology consulted and recommendations given.    GERD: ON ppi.    DVT prophylaxis: heparin Code Status: (Full) Family Communication: wife at bedside.  Disposition Plan: pending further evaluation.    Consultants:   Surgery  Oncology.    Procedures: none.   Antimicrobials:zosyn.   Subjective: Pain not well controlled with fentanyl.   Objective: Vitals:   09/25/16 2007 09/25/16 2200 09/26/16 0551 09/26/16 1407  BP:  129/76 122/85 127/79  Pulse:  72 100 81  Resp:  18 18 18   Temp:  99.1 F (37.3 C) 99 F (37.2 C) 98.9 F (37.2 C)  TempSrc:  Oral Oral Oral  SpO2: 93% 97% 96% 98%  Weight:  96.4 kg (212 lb 8 oz) 96.3 kg (212 lb 6.4 oz)   Height:  6' (1.829 m)       Intake/Output Summary (Last 24 hours) at 09/26/16 1413 Last data filed at 09/26/16 1300  Gross per 24 hour  Intake             1020 ml  Output                0 ml  Net             1020 ml   Filed Weights   09/25/16 1107 09/25/16 2200 09/26/16 0551  Weight: 97.5 kg (215 lb) 96.4 kg (212 lb 8 oz) 96.3 kg (212 lb 6.4 oz)    Examination:  General exam: Appears calm and comfortable  Respiratory system: Clear to auscultation. Respiratory effort normal. Cardiovascular system: S1 & S2 heard, RRR. No JVD, murmurs, rubs, gallops or clicks. No pedal edema. Gastrointestinal system: Abdomen is mildly distended, and tender generalized, more in the upper quadrant.. No organomegaly or masses felt. Normal bowel sounds heard. Central nervous system: Alert and oriented. No focal neurological deficits. Extremities: Symmetric 5 x 5 power. Skin: No rashes, lesions or ulcers Psychiatry: Judgement and insight appear normal. Mood & affect appropriate.     Data Reviewed: I have personally reviewed following labs and imaging studies  CBC:  Recent Labs Lab 09/25/16 1118 09/26/16 0435  WBC 11.6* 10.8*  HGB 15.3 14.9  HCT 43.7 42.9  MCV 85.4 86.5  PLT 228 673   Basic Metabolic Panel:  Recent Labs Lab 09/25/16 1118 09/26/16 0435  NA 138 136  K 4.1 3.6  CL 102 103  CO2 28 26  GLUCOSE 117* 109*  BUN 21* 19  CREATININE 1.07 1.03  CALCIUM 9.2 8.8*   GFR: Estimated Creatinine Clearance: 96.4 mL/min (by C-G formula based on SCr of 1.03 mg/dL). Liver Function Tests:  Recent Labs Lab 09/25/16 1118 09/26/16 0435  AST 26 21  ALT 20 16*  ALKPHOS 90 80  BILITOT 0.7 1.2  PROT 6.9 6.5  ALBUMIN 3.9 3.6    Recent Labs Lab 09/25/16 1118  LIPASE 19   No results for input(s): AMMONIA in the last 168 hours. Coagulation Profile:  Recent Labs Lab 09/25/16 1518  INR 1.04   Cardiac Enzymes:  Recent Labs Lab 09/25/16 1118  TROPONINI <0.03   BNP (last 3 results) No results for  input(s): PROBNP in the last 8760 hours. HbA1C: No results for input(s): HGBA1C in the last 72 hours. CBG: No results for input(s): GLUCAP in the last 168 hours. Lipid Profile: No results for input(s): CHOL, HDL, LDLCALC, TRIG, CHOLHDL, LDLDIRECT in the last 72 hours. Thyroid Function Tests: No results for input(s): TSH, T4TOTAL, FREET4, T3FREE, THYROIDAB in the last 72 hours. Anemia Panel: No results for input(s): VITAMINB12, FOLATE, FERRITIN, TIBC, IRON, RETICCTPCT in the last 72 hours. Sepsis Labs: No results for input(s): PROCALCITON, LATICACIDVEN in the last 168 hours.  Recent Results (from the past 240 hour(s))  Culture, blood (routine x 2)     Status: None (Preliminary result)   Collection Time: 09/25/16  3:13 PM  Result Value Ref Range Status   Specimen Description RIGHT ANTECUBITAL  Final   Special Requests   Final    BOTTLES DRAWN AEROBIC AND ANAEROBIC Blood Culture adequate volume   Culture NO GROWTH < 24 HOURS  Final   Report Status PENDING  Incomplete  Culture, blood (routine x 2)     Status: None (Preliminary result)   Collection Time: 09/25/16  3:19 PM  Result Value Ref Range Status   Specimen Description LEFT ANTECUBITAL  Final   Special Requests   Final    BOTTLES DRAWN AEROBIC AND ANAEROBIC Blood Culture adequate volume   Culture NO GROWTH < 24 HOURS  Final   Report Status PENDING  Incomplete         Radiology Studies: Dg Chest 2 View  Result Date: 09/25/2016 CLINICAL DATA:  Upper abdominal pain, nausea/vomiting EXAM: CHEST  2 VIEW COMPARISON:  01/30/2016 FINDINGS: Lungs are clear.  No pleural effusion or pneumothorax. The heart is normal in size. Left chest port terminates in the mid SVC. IMPRESSION: No evidence of acute cardiopulmonary disease. Electronically Signed   By: Julian Hy M.D.   On: 09/25/2016 13:00   Ct Abdomen Pelvis W Contrast  Result Date: 09/25/2016 CLINICAL DATA:  Epigastric pain for 3 days, some nausea and vomiting, history of  carcinoma of the colon with surgery and chemotherapy EXAM: CT ABDOMEN AND PELVIS WITH CONTRAST TECHNIQUE: Multidetector CT imaging of the abdomen and pelvis was performed using the standard protocol following bolus administration of intravenous contrast. CONTRAST:  184mL ISOVUE-300 IOPAMIDOL (ISOVUE-300) INJECTION 61% COMPARISON:  CT abdomen pelvis of 04/04/2016 FINDINGS: Lower chest: The lung bases are clear. The heart is within normal limits in size. Hepatobiliary: The liver enhances with no focal abnormality and no ductal dilatation is seen. The gallbladder however is somewhat distended and there does appear to be gallbladder wall thickening and pericholecystic strandiness worrisome for acute cholecystitis. Correlate clinically. No calcified gallstones are evident. The common bile duct is  not dilated. No intrahepatic ductal dilatation is seen. If confirmation of acute cholecystitis is necessary ultrasound or nuclear medicine hepatobiliary scan may be helpful. Pancreas: The pancreas is normal in size. The pancreatic duct is not dilated. The peripancreatic fat planes are well preserved. Spleen: The spleen is inhomogeneous but within normal limits. A small splenule is noted medially and inferiorly. Adrenals/Urinary Tract: The adrenal glands appear normal. The kidneys enhance with no calculus or mass. On delayed images, the pelvocaliceal systems are unremarkable. The urinary bladder is not optimally distended. However there is abnormal higher attenuation material along the right superior posterior aspect urinary bladder worrisome for tumor. Some of this attenuation could be due to contrast, but the asymmetry is worrisome for possible bladder tumor. Stomach/Bowel: The stomach is not well distended. No small bowel distention is seen. By history the patient has had partial resection of the sigmoid colon. At the site of resection, there is mucosal nodular thickening. This could be due to acute diverticulitis, but  recurrence of tumor is also a consideration. Just anterior to this site of surgery there is a lobular soft tissue mass of 3.7 x 2.9 cm very suspicious for recurrence of tumor, previously measuring 16 mm in diameter. There is feces throughout the more proximal colon. The terminal ileum is unremarkable. Vascular/Lymphatic: The abdominal aorta is normal in caliber. However, there is retroperitoneal adenopathy. Some of the larger nodes are adjacent to the aorta as on image 34 series 2 with a node measuring 17 mm in short axis diameter. On image 37 series 2 there is a node 16 mm in short axis diameter. Adenopathy also is noted in the pelvis near the area of previous resection and reanastomosis again worrisome for recurrence of carcinoma. A node on image 64 series 2 measures 15 mm. A right external iliac node on image 76 measures 2.3 cm in diameter. Reproductive: The prostate gland is normal in size. Other: A new soft tissue nodule anteriorly within the left abdomen on image 50 measures 8 mm could indicate peritoneal spread of disease. In addition there is nodular soft tissue within the right lower quadrant near the cecum again suspicious for peritoneal spread of tumor. Musculoskeletal: No lytic or blastic bone lesion is seen IMPRESSION: 1. Distended gallbladder with thickened wall and pericholecystic strandiness worrisome for acute cholecystitis. Correlate clinically. 2. Enlarging soft tissue mass adjacent to the area of the anastomosis of the rectosigmoid colon worrisome for recurrence of tumor. She adjacent adenopathy with retroperitoneal adenopathy noted as well. 3. Higher attenuation within the superior aspect of the urinary bladder to the right of midline worrisome for tumor. 4. There are a few small soft tissue nodules in the peritoneal cavity worrisome for peritoneal spread of disease. Electronically Signed   By: Ivar Drape M.D.   On: 09/25/2016 13:43   US Abdomen Limited Ruq  Result Date: 09/25/2016 CLINICAL  DATA:  Upper abdominal pain for 3 days EXAM: US ABDOMEN LIMITED - RIGHT UPPER QUADRANT COMPARISON:  CT scan 09/25/2016 FINDINGS: Gallbladder: Small layering gallbladder sludge noted. No shadowing gallstones are noted within gallbladder. There is thickening of gallbladder wall up to 3.5 mm. Positive sonographic Murphy sign. Findings are highly suspicious for acute cholecystitis and clinical correlation is necessary. Common bile duct: Diameter: 2.3 mm in diameter within normal limits. Liver: No focal lesion identified. Within normal limits in parenchymal echogenicity. IMPRESSION: Small layering gallbladder sludge noted. No shadowing gallstones are noted within gallbladder. There is thickening of gallbladder wall up to 3.5 mm. Positive sonographic  Murphy sign. Findings are highly suspicious for acute cholecystitis and clinical correlation is necessary. Normal CBD. Electronically Signed   By: Lahoma Crocker M.D.   On: 09/25/2016 14:44        Scheduled Meds: . buPROPion  150 mg Oral Daily  . clonazePAM  1 mg Oral QHS  . heparin  5,000 Units Subcutaneous Q8H  . lidocaine-prilocaine   Topical Once  . pantoprazole  40 mg Oral Daily  . piperacillin-tazobactam (ZOSYN)  IV  3.375 g Intravenous Q8H  . senna-docusate  2 tablet Oral QHS   Continuous Infusions: . sodium chloride 75 mL/hr at 09/26/16 0856     LOS: 1 day    Time spent: 8 minutes    Selwyn Reason, MD [facility] Pager [phone]  If 7PM-7AM, please contact night-coverage [email or web address] Password [email or web address] 09/26/2016, 2:13 PM

## 2016-09-26 NOTE — Consult Note (Signed)
Consultation Note Date: 09/26/2016   Patient Name: Derek Rodriguez  DOB: Feb 17, 1961  MRN: [id number]  Age / Sex: 56 y.o., male  PCP: [name], MD Referring Physician: Hosie Poisson, MD  Reason for Consultation: Establishing goals of care and Psychosocial/spiritual support  HPI/Patient Profile: 56 y.o. male  with past medical history of Diverticulitis, hypertension, arthritis, adenocarcinoma of sigmoid colon with colectomy and chemotherapy admitted on 09/25/2016 with mild acute cholecystitis, likely recurrence of colon cancer.   Clinical Assessment and Goals of Care: Mr. Howes is sitting quietly in bed. He makes but does not keep eye contact. His wife is present at bedside, but stands near the window during our conversation. Often Mrs. Joswick will answer questions, not giving Mr. Bacchi an opportunity to speak. She states that he doesn't know his medications and she usually just answers for him.  We talk about symptom management. Mr. Inniss states that he has been having increasing pain. He states that morphine has not helped him, and hospitalist, Dr. Karleen Hampshire, has ordered dilauded. Mr. Bernards states that he has been waiting for this pain medication, and I share with him that the medication is "as needed", and he MUST ask for this medication.  We talk about anxiety, his wife shares that he was put on an antidepressant, but has not been taking it, he feels that it hasn't helped him. We talk about these of benzodiazepines and their risk. I share with Mr. Schwandt that as needed to help with sleep would benefit him. He is currently taking Klonopin 1 mg to help with sleep. Ativan 0.5 mg PO QHS PRN added.  We talk about the possibility of surgery in his future. Mr. Mostafa states that he does not want to have surgery, but will if it's necessary. We talk briefly about code status. I share that right now we have it  listed that if/when his heart naturally stops we will come in and impress on his chest to try to restart it, I tube into his lung to breathe for him. I encourage him that as his health/body changes, to consider what is right for him in the future.  We briefly talk about CT imaging. Mrs. Vanderberg states that they are waiting on oncology's input. I share that oncology usually comes to the hospital after their day is finished. I share that I will come back tomorrow for questions or concerns.  Healthcare power of attorney NEXT OF KIN  - no detailed discussion related to power of attorney today.   SUMMARY OF RECOMMENDATIONS   continue to treat the treatable. Future options such as chemotherapy not discussed.  Code Status/Advance Care Planning:  Full code  Symptom Management:   Added bowel regimen, senns S, dulcolax supp PRN. Ativan 0.25 mg PO QHS PRN  Palliative Prophylaxis:   Bowel Regimen and Frequent Pain Assessment  Additional Recommendations (Limitations, Scope, Preferences):  Full Scope Treatment  Psycho-social/Spiritual:   Desire for further Chaplaincy support:no  Additional Recommendations: Caregiving  Support/Resources  Prognosis:   Unable to determine, Based  on outcomes.  Discharge Planning: To Be Determined      Primary Diagnoses: Present on Admission: . Adenocarcinoma of sigmoid colon ([place]) . Depression . Acute cholecystitis   I have reviewed the medical record, interviewed the patient and family, and examined the patient. The following aspects are pertinent.  Past Medical History:  Diagnosis Date  . Adenocarcinoma of sigmoid colon ([place]) 10/18/2015  . Arthritis   . Diverticulitis   . Hypertension    Social History   Social History  . Marital status: Married    Spouse name: N/A  . Number of children: N/A  . Years of education: N/A   Social History Main Topics  . Smoking status: Former Smoker    Types: 2, Pipe    Quit date: 09/26/1985  .  Smokeless tobacco: Former Systems developer    Types: Snuff    Quit date: 06/29/2015  . Alcohol use No     Comment: occ  . Drug use: No  . Sexual activity: Yes    Birth control/ protection: None   Other Topics Concern  . None   Social History Narrative  . None   Family History  Problem Relation Age of Onset  . Diabetes Father    Scheduled Meds: . buPROPion  150 mg Oral Daily  . clonazePAM  1 mg Oral QHS  . heparin  5,000 Units Subcutaneous Q8H  . lidocaine-prilocaine   Topical Once  . pantoprazole  40 mg Oral Daily  . piperacillin-tazobactam (ZOSYN)  IV  3.375 g Intravenous Q8H  . senna-docusate  2 tablet Oral QHS   Continuous Infusions: . sodium chloride 75 mL/hr at 09/26/16 0856   PRN Meds:.albuterol, bisacodyl, fentaNYL (SUBLIMAZE) injection, heparin lock flush, HYDROmorphone (DILAUDID) injection, morphine injection, ondansetron (ZOFRAN) IV, oxyCODONE-acetaminophen Medications Prior to Admission:  Prior to Admission medications   Medication Sig Start Date End Date Taking? Authorizing Provider  gabapentin (NEURONTIN) 300 MG capsule Take 1 capsule at HS x 3 days, then 2 capsules at HS x 3 days, and then 3 capsules at HS thereafter 09/14/16  Yes Manon Hilding Kefalas, PA-C  ibuprofen (ADVIL,MOTRIN) 200 MG tablet Take 400 mg by mouth every 6 (six) hours as needed for mild pain or moderate pain.   Yes Historical Provider, MD  omeprazole (PRILOSEC) 40 MG capsule Take 1 capsule (40 mg total) by mouth daily. 06/08/16  Yes Baird Cancer, PA-C  zolpidem (AMBIEN) 10 MG tablet Take 1 tablet (10 mg total) by mouth at bedtime as needed for sleep. 06/08/16  Yes Baird Cancer, PA-C  Heparin Lock Flush (HEPARIN FLUSH, PORCINE,) 100 UNIT/ML injection Flush port with 5cc of heparin lock flush. 03/02/16   Patrici Ranks, MD  lidocaine-prilocaine (EMLA) cream Apply a quarter size amount to port site 1 hour prior to chemo. Do not rub in. Cover with plastic wrap. Patient not taking: Reported on 09/25/2016  11/15/15   Patrici Ranks, MD  oxyCODONE-acetaminophen (PERCOCET) 7.5-325 MG tablet Take 1 tablet by mouth every 6 (six) hours as needed for moderate pain. 06/08/16   Baird Cancer, PA-C  prochlorperazine (COMPAZINE) 10 MG tablet Take 1 tablet (10 mg total) by mouth every 6 (six) hours as needed for nausea or vomiting. Patient not taking: Reported on 09/25/2016 01/06/16   Patrici Ranks, MD  Sodium Chloride Flush (NORMAL SALINE FLUSH) 0.9 % SOLN Flush port with 10 ml of normal saline. 01/13/16   Patrici Ranks, MD   No Known Allergies Review of Systems  Unable  to perform ROS: Other    Physical Exam  Constitutional: He is oriented to person, place, and time. He appears well-nourished. No distress.  Makes but does not keep eye contact  HENT:  Head: Normocephalic and atraumatic.  Cardiovascular: Normal rate and regular rhythm.   Pulmonary/Chest: Effort normal. No respiratory distress.  Abdominal: Soft. He exhibits no distension. There is tenderness.  Musculoskeletal: He exhibits no edema.  Neurological: He is alert and oriented to person, place, and time.  Skin: Skin is warm and dry.  Left chest porta cath  Nursing note and vitals reviewed.   Vital Signs: BP 122/85 (BP Location: Left Arm)   Pulse 100   Temp 99 F (37.2 C) (Oral)   Resp 18   Ht 6' (1.829 m)   Wt 96.3 kg (212 lb 6.4 oz)   SpO2 96%   BMI 28.81 kg/m  Pain Assessment: 0-10   Pain Score: 6    SpO2: SpO2: 96 % O2 Device:SpO2: 96 % O2 Flow Rate: .   IO: Intake/output summary:  Intake/Output Summary (Last 24 hours) at 09/26/16 1252 Last data filed at 09/26/16 4656  Gross per 24 hour  Intake              780 ml  Output                0 ml  Net              780 ml    LBM: Last BM Date: 09/22/16 Baseline Weight: Weight: 97.5 kg (215 lb) Most recent weight: Weight: 96.3 kg (212 lb 6.4 oz)     Palliative Assessment/Data:   Flowsheet Rows     Most Recent Value  Intake Tab  Referral Department   Hospitalist  Unit at Time of Referral  Med/Surg Unit  Palliative Care Primary Diagnosis  Other (Comment) [cholecystitis]  Date Notified  09/26/16  Palliative Care Type  New Palliative care  Reason for referral  Clarify Goals of Care, Advance Care Planning  Date of Admission  09/25/16  Date first seen by Palliative Care  09/26/16  # of days Palliative referral response time  0 Day(s)  # of days IP prior to Palliative referral  1  Clinical Assessment  Palliative Performance Scale Score  60%  Pain Max last 24 hours  8  Pain Min Last 24 hours  2  Dyspnea Max Last 24 Hours  0  Dyspnea Min Last 24 hours  0  Psychosocial & Spiritual Assessment  Palliative Care Outcomes  Patient/Family meeting held?  Yes  Who was at the meeting?  Patient and wife      Time In: 1205 Time Out: 1245 Time Total: 40 minutes Greater than 50%  of this time was spent counseling and coordinating care related to the above assessment and plan.  Signed by: Drue Novel, NP   Please contact Palliative Medicine Team phone at [phone] for questions and concerns.  For individual provider: See Shea Evans

## 2016-09-26 NOTE — Consult Note (Signed)
Reason for Consult: Right upper quadrant abdominal pain Referring Physician: Dr. Lurlean Leyden Derek Rodriguez is an 56 y.o. male.  HPI: Patient is a 56 year old white male status post partial colectomy 1 year ago who presents with right upper quadrant abdominal pain. He states it started 5 days ago. It was made worse with food, though it was several hours after eating. He did develop nausea and vomiting. This was his first time ever having right upper quadrant abdominal pain. He denies any fever, chills, jaundice. His pain is somewhat decreased to a 4 out of 10 with pain medication. CT scan of the abdomen and pelvis reveals a thickened gallbladder wall, but evidence of recurrence of his colon cancer. Ultrasound reveals a thickened gallbladder wall with biliary sludge present. Normal common bile duct is noted. Patient refused evaluation at [facility] for [facility]. He did not tolerate chemotherapy well and his Port-A-Cath was removed last fall.  Past Medical History:  Diagnosis Date  . 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 Arthritis   . Diverticulitis   . Hypertension     Past Surgical History:  Procedure Laterality Date  . BIOPSY  02/06/2016   Procedure: BIOPSY;  Surgeon: Daneil Dolin, MD;  Location: [facility];  Service: Endoscopy;;  esophagus  . ESOPHAGEAL DILATION  02/06/2016   Procedure: ESOPHAGEAL DILATION;  Surgeon: Daneil Dolin, MD;  Location: [facility];  Service: Endoscopy;;  . ESOPHAGOGASTRODUODENOSCOPY N/A 02/06/2016   Procedure: ESOPHAGOGASTRODUODENOSCOPY (EGD);  Surgeon: Daneil Dolin, MD;  Location: [facility];  Service: Endoscopy;  Laterality: N/A;  115  . PARTIAL COLECTOMY N/A 10/03/2015   Procedure: PARTIAL COLECTOMY;  Surgeon: Aviva Signs, MD;  Location: [facility] ORS;  Service: General;  Laterality: N/A;  . PORTACATH PLACEMENT N/A 11/18/2015   Procedure: INSERTION PORT-A-CATH LEFT SUBCLAVIAN;  Surgeon: Aviva Signs, MD;  Location: [facility] OR[facility];  Service: General;   Laterality: N/A;    Family History  Problem Relation Age of Onset  . Diabetes Father     Social History:  reports that he quit smoking about 31 years ago. His smoking use included Cigars and Pipe. He quit smokeless tobacco use about 14 months ago. His smokeless tobacco use included Snuff. He reports that he does not drink alcohol or use drugs.  Allergies: No Known Allergies  Medications:  Prior to Admission:  Prescriptions Prior to Admission  Medication Sig Dispense Refill Last Dose  . gabapentin (NEURONTIN) 300 MG capsule Take 1 capsule at HS x 3 days, then 2 capsules at HS x 3 days, and then 3 capsules at HS thereafter 90 capsule 1 09/24/2016 at Unknown time  . ibuprofen (ADVIL,MOTRIN) 200 MG tablet Take 400 mg by mouth every 6 (six) hours as needed for mild pain or moderate pain.   Past Week at Unknown time  . omeprazole (PRILOSEC) 40 MG capsule Take 1 capsule (40 mg total) by mouth daily. 30 capsule 5 09/24/2016 at Unknown time  . zolpidem (AMBIEN) 10 MG tablet Take 1 tablet (10 mg total) by mouth at bedtime as needed for sleep. 30 tablet 3 Past Week at Unknown time  . Heparin Lock Flush (HEPARIN FLUSH, PORCINE,) 100 UNIT/ML injection Flush port with 5cc of heparin lock flush. 10 Syringe 0 Taking  . lidocaine-prilocaine (EMLA) cream Apply a quarter size amount to port site 1 hour prior to chemo. Do not rub in. Cover with plastic wrap. (Patient not taking: Reported on 09/25/2016) 30 g 3 Not Taking at Unknown time  .  oxyCODONE-acetaminophen (PERCOCET) 7.5-325 MG tablet Take 1 tablet by mouth every 6 (six) hours as needed for moderate pain. 60 tablet 0 Unknown at Unknown time  . prochlorperazine (COMPAZINE) 10 MG tablet Take 1 tablet (10 mg total) by mouth every 6 (six) hours as needed for nausea or vomiting. (Patient not taking: Reported on 09/25/2016) 90 tablet 6 Unknown at Unknown time  . Sodium Chloride Flush (NORMAL SALINE FLUSH) 0.9 % SOLN Flush port with 10 ml of normal saline. 10 Syringe 0  Unknown at Unknown time   Scheduled: . buPROPion  150 mg Oral Daily  . clonazePAM  1 mg Oral QHS  . heparin  5,000 Units Subcutaneous Q8H  . lidocaine-prilocaine   Topical Once  . pantoprazole  40 mg Oral Daily  . piperacillin-tazobactam (ZOSYN)  IV  3.375 g Intravenous Q8H    Results for orders placed or performed during the hospital encounter of 09/25/16 (from the past 48 hour(s))  Basic metabolic panel     Status: Abnormal   Collection Time: 09/25/16 11:18 AM  Result Value Ref Range   Sodium 138 135 - 145 mmol/L   Potassium 4.1 3.5 - 5.1 mmol/L   Chloride 102 101 - 111 mmol/L   CO2 28 22 - 32 mmol/L   Glucose, Bld 117 (H) 65 - 99 mg/dL   BUN 21 (H) 6 - 20 mg/dL   Creatinine, Ser 1.07 0.61 - 1.24 mg/dL   Calcium 9.2 8.9 - 10.3 mg/dL   GFR calc non Af Amer >60 >60 mL/min   GFR calc Af Amer >60 >60 mL/min    Comment: (NOTE) The eGFR has been calculated using the CKD EPI equation. This calculation has not been validated in all clinical situations. eGFR's persistently <60 mL/min signify possible Chronic Kidney Disease.    Anion gap 8 5 - 15  CBC     Status: Abnormal   Collection Time: 09/25/16 11:18 AM  Result Value Ref Range   WBC 11.6 (H) 4.0 - 10.5 K/uL   RBC 5.12 4.22 - 5.81 MIL/uL   Hemoglobin 15.3 13.0 - 17.0 g/dL   HCT 43.7 39.0 - 52.0 %   MCV 85.4 78.0 - 100.0 fL   MCH 29.9 26.0 - 34.0 pg   MCHC 35.0 30.0 - 36.0 g/dL   RDW 13.5 11.5 - 15.5 %   Platelets 228 150 - 400 K/uL  Lipase, blood     Status: None   Collection Time: 09/25/16 11:18 AM  Result Value Ref Range   Lipase 19 11 - 51 U/L  Hepatic function panel     Status: None   Collection Time: 09/25/16 11:18 AM  Result Value Ref Range   Total Protein 6.9 6.5 - 8.1 g/dL   Albumin 3.9 3.5 - 5.0 g/dL   AST 26 15 - 41 U/L   ALT 20 17 - 63 U/L   Alkaline Phosphatase 90 38 - 126 U/L   Total Bilirubin 0.7 0.3 - 1.2 mg/dL   Bilirubin, Direct 0.1 0.1 - 0.5 mg/dL   Indirect Bilirubin 0.6 0.3 - 0.9 mg/dL   Troponin I     Status: None   Collection Time: 09/25/16 11:18 AM  Result Value Ref Range   Troponin I <0.03 <0.03 ng/mL  CEA     Status: Abnormal   Collection Time: 09/25/16 11:18 AM  Result Value Ref Range   CEA 7.0 (H) 0.0 - 4.7 ng/mL    Comment: (NOTE)       Roche ECLIA methodology  Nonsmokers  <3.9                                     Smokers     <5.6 Performed At: [facility] [place], [place] [id number] Lindon Romp MD [id number]   Culture, blood (routine x 2)     Status: None (Preliminary result)   Collection Time: 09/25/16  3:13 PM  Result Value Ref Range   Specimen Description RIGHT ANTECUBITAL    Special Requests      BOTTLES DRAWN AEROBIC AND ANAEROBIC Blood Culture adequate volume   Culture PENDING    Report Status PENDING   Protime-INR     Status: None   Collection Time: 09/25/16  3:18 PM  Result Value Ref Range   Prothrombin Time 13.6 11.4 - 15.2 seconds   INR 1.04   Culture, blood (routine x 2)     Status: None (Preliminary result)   Collection Time: 09/25/16  3:19 PM  Result Value Ref Range   Specimen Description LEFT ANTECUBITAL    Special Requests      BOTTLES DRAWN AEROBIC AND ANAEROBIC Blood Culture adequate volume   Culture PENDING    Report Status PENDING   HIV antibody (Routine Testing)     Status: None   Collection Time: 09/25/16  5:03 PM  Result Value Ref Range   HIV Screen 4th Generation wRfx Non Reactive Non Reactive    Comment: (NOTE) Performed At: [facility] [place], [place] [id number] Lindon Romp MD [id number]   CBC     Status: Abnormal   Collection Time: 09/26/16  4:35 AM  Result Value Ref Range   WBC 10.8 (H) 4.0 - 10.5 K/uL   RBC 4.96 4.22 - 5.81 MIL/uL   Hemoglobin 14.9 13.0 - 17.0 g/dL   HCT 42.9 39.0 - 52.0 %   MCV 86.5 78.0 - 100.0 fL   MCH 30.0 26.0 - 34.0 pg   MCHC 34.7 30.0 - 36.0 g/dL   RDW 13.8 11.5 - 15.5 %   Platelets 223 150 - 400 K/uL   Comprehensive metabolic panel     Status: Abnormal   Collection Time: 09/26/16  4:35 AM  Result Value Ref Range   Sodium 136 135 - 145 mmol/L   Potassium 3.6 3.5 - 5.1 mmol/L   Chloride 103 101 - 111 mmol/L   CO2 26 22 - 32 mmol/L   Glucose, Bld 109 (H) 65 - 99 mg/dL   BUN 19 6 - 20 mg/dL   Creatinine, Ser 1.03 0.61 - 1.24 mg/dL   Calcium 8.8 (L) 8.9 - 10.3 mg/dL   Total Protein 6.5 6.5 - 8.1 g/dL   Albumin 3.6 3.5 - 5.0 g/dL   AST 21 15 - 41 U/L   ALT 16 (L) 17 - 63 U/L   Alkaline Phosphatase 80 38 - 126 U/L   Total Bilirubin 1.2 0.3 - 1.2 mg/dL   GFR calc non Af Amer >60 >60 mL/min   GFR calc Af Amer >60 >60 mL/min    Comment: (NOTE) The eGFR has been calculated using the CKD EPI equation. This calculation has not been validated in all clinical situations. eGFR's persistently <60 mL/min signify possible Chronic Kidney Disease.    Anion gap 7 5 - 15    Dg Chest 2 View  Result Date: 09/25/2016 CLINICAL DATA:  Upper abdominal pain, nausea/vomiting EXAM: CHEST  2 VIEW COMPARISON:  01/30/2016 FINDINGS: Lungs are clear.  No pleural effusion or pneumothorax. The heart is normal in size. Left chest port terminates in the mid SVC. IMPRESSION: No evidence of acute cardiopulmonary disease. Electronically Signed   By: Julian Hy M.D.   On: 09/25/2016 13:00   Ct Abdomen Pelvis W Contrast  Result Date: 09/25/2016 CLINICAL DATA:  Epigastric pain for 3 days, some nausea and vomiting, history of carcinoma of the colon with surgery and chemotherapy EXAM: CT ABDOMEN AND PELVIS WITH CONTRAST TECHNIQUE: Multidetector CT imaging of the abdomen and pelvis was performed using the standard protocol following bolus administration of intravenous contrast. CONTRAST:  154m ISOVUE-300 IOPAMIDOL (ISOVUE-300) INJECTION 61% COMPARISON:  CT abdomen pelvis of 04/04/2016 FINDINGS: Lower chest: The lung bases are clear. The heart is within normal limits in size. Hepatobiliary: The liver enhances with no  focal abnormality and no ductal dilatation is seen. The gallbladder however is somewhat distended and there does appear to be gallbladder wall thickening and pericholecystic strandiness worrisome for acute cholecystitis. Correlate clinically. No calcified gallstones are evident. The common bile duct is not dilated. No intrahepatic ductal dilatation is seen. If confirmation of acute cholecystitis is necessary ultrasound or nuclear medicine hepatobiliary scan may be helpful. Pancreas: The pancreas is normal in size. The pancreatic duct is not dilated. The peripancreatic fat planes are well preserved. Spleen: The spleen is inhomogeneous but within normal limits. A small splenule is noted medially and inferiorly. Adrenals/Urinary Tract: The adrenal glands appear normal. The kidneys enhance with no calculus or mass. On delayed images, the pelvocaliceal systems are unremarkable. The urinary bladder is not optimally distended. However there is abnormal higher attenuation material along the right superior posterior aspect urinary bladder worrisome for tumor. Some of this attenuation could be due to contrast, but the asymmetry is worrisome for possible bladder tumor. Stomach/Bowel: The stomach is not well distended. No small bowel distention is seen. By history the patient has had partial resection of the sigmoid colon. At the site of resection, there is mucosal nodular thickening. This could be due to acute diverticulitis, but recurrence of tumor is also a consideration. Just anterior to this site of surgery there is a lobular soft tissue mass of 3.7 x 2.9 cm very suspicious for recurrence of tumor, previously measuring 16 mm in diameter. There is feces throughout the more proximal colon. The terminal ileum is unremarkable. Vascular/Lymphatic: The abdominal aorta is normal in caliber. However, there is retroperitoneal adenopathy. Some of the larger nodes are adjacent to the aorta as on image 34 series 2 with a node  measuring 17 mm in short axis diameter. On image 37 series 2 there is a node 16 mm in short axis diameter. Adenopathy also is noted in the pelvis near the area of previous resection and reanastomosis again worrisome for recurrence of carcinoma. A node on image 64 series 2 measures 15 mm. A right external iliac node on image 76 measures 2.3 cm in diameter. Reproductive: The prostate gland is normal in size. Other: A new soft tissue nodule anteriorly within the left abdomen on image 50 measures 8 mm could indicate peritoneal spread of disease. In addition there is nodular soft tissue within the right lower quadrant near the cecum again suspicious for peritoneal spread of tumor. Musculoskeletal: No lytic or blastic bone lesion is seen IMPRESSION: 1. Distended gallbladder with thickened wall and pericholecystic strandiness worrisome for acute cholecystitis. Correlate clinically. 2. Enlarging soft tissue mass adjacent to the area of the  anastomosis of the rectosigmoid colon worrisome for recurrence of tumor. She adjacent adenopathy with retroperitoneal adenopathy noted as well. 3. Higher attenuation within the superior aspect of the urinary bladder to the right of midline worrisome for tumor. 4. There are a few small soft tissue nodules in the peritoneal cavity worrisome for peritoneal spread of disease. Electronically Signed   By: Ivar Drape M.D.   On: 09/25/2016 13:43   US Abdomen Limited Ruq  Result Date: 09/25/2016 CLINICAL DATA:  Upper abdominal pain for 3 days EXAM: US ABDOMEN LIMITED - RIGHT UPPER QUADRANT COMPARISON:  CT scan 09/25/2016 FINDINGS: Gallbladder: Small layering gallbladder sludge noted. No shadowing gallstones are noted within gallbladder. There is thickening of gallbladder wall up to 3.5 mm. Positive sonographic Murphy sign. Findings are highly suspicious for acute cholecystitis and clinical correlation is necessary. Common bile duct: Diameter: 2.3 mm in diameter within normal limits. Liver: No  focal lesion identified. Within normal limits in parenchymal echogenicity. IMPRESSION: Small layering gallbladder sludge noted. No shadowing gallstones are noted within gallbladder. There is thickening of gallbladder wall up to 3.5 mm. Positive sonographic Murphy sign. Findings are highly suspicious for acute cholecystitis and clinical correlation is necessary. Normal CBD. Electronically Signed   By: Lahoma Crocker M.D.   On: 09/25/2016 14:44    ROS:  Pertinent items noted in HPI and remainder of comprehensive ROS otherwise negative.  Blood pressure 122/85, pulse 100, temperature 99 F (37.2 C), temperature source Oral, resp. rate 18, height 6' (1.829 m), weight 212 lb 6.4 oz (96.3 kg), SpO2 96 %. Physical Exam: Pleasant well-developed well-nourished white male in no acute distress. Head is normocephalic, atraumatic. Eyes are without scleral icterus. Neck is supple without lymphadenopathy. Lungs clear auscultation with equal breath sounds bilaterally. Heart examination reveals a regular rate and rhythm without S3, S4, murmurs. Abdomen is soft with mild tenderness in the right upper quadrant to deep palpation. No masses are noted. No hernias are noted. No rigidity is noted.  CT scan images personally reviewed. Oncology notes reviewed.   Assessment/Plan: Impression: Cholecystitis secondary to biliary sludge. Given rising CEA level, strongly suspect recurrence of his colon cancer.  I had a long discussion with the patient concerning the possible recurrence of the colon cancer. I since he is in denial that this could be recurrence. He does not want any surgery unless absolutely necessary. As he does not have a significant leukocytosis and no liver enzyme abnormalities, I told him we would try a soft diet and see if his pain and symptoms were under control. Should he not tolerate a diet, he will need a cholecystectomy. I told him I could then look at those areas and do a biopsy to see whether or not there  is recurrence of his colon cancer.  Derek Rodriguez 09/26/2016, 9:28 AM

## 2016-09-27 ENCOUNTER — Other Ambulatory Visit (HOSPITAL_COMMUNITY): Payer: Self-pay | Admitting: Oncology

## 2016-09-27 DIAGNOSIS — R112 Nausea with vomiting, unspecified: Secondary | ICD-10-CM

## 2016-09-27 DIAGNOSIS — R1013 Epigastric pain: Secondary | ICD-10-CM

## 2016-09-27 MED ORDER — ONDANSETRON HCL 4 MG PO TABS: 4.0000 mg | ORAL_TABLET | Freq: Three times a day (TID) | ORAL | 0 refills | Status: DC | PRN

## 2016-09-27 MED ORDER — CIPROFLOXACIN HCL 500 MG PO TABS: 500.0000 mg | ORAL_TABLET | Freq: Two times a day (BID) | ORAL | 0 refills | Status: DC

## 2016-09-27 MED ORDER — BISACODYL 10 MG RE SUPP: 10.0000 mg | Freq: Every day | RECTAL | 0 refills | Status: DC | PRN

## 2016-09-27 MED ORDER — SENNOSIDES-DOCUSATE SODIUM 8.6-50 MG PO TABS: 2.0000 | ORAL_TABLET | Freq: Every day | ORAL | 0 refills | Status: AC

## 2016-09-27 MED ORDER — OXYCODONE-ACETAMINOPHEN 7.5-325 MG PO TABS: 1.0000 | ORAL_TABLET | Freq: Four times a day (QID) | ORAL | 0 refills | Status: DC | PRN

## 2016-09-27 MED ORDER — LORAZEPAM 0.5 MG PO TABS: 0.5000 mg | ORAL_TABLET | Freq: Every evening | ORAL | 0 refills | Status: DC | PRN

## 2016-09-27 NOTE — Progress Notes (Signed)
Subjective: Patient states he has minimal right upper quadrant abdominal pain. He is tolerating a low-fat regular diet well. He has had no emesis since admission. No fever or chills.  Objective: Vital signs in last 24 hours: Temp:  [97.6 F (36.4 C)-98.9 F (37.2 C)] 98.4 F (36.9 C) (04/12 0556) Pulse Rate:  [69-95] 69 (04/12 0556) Resp:  [18-20] 18 (04/12 0556) BP: (104-127)/(59-79) 125/74 (04/12 0556) SpO2:  [96 %-100 %] 98 % (04/12 0556) Weight:  [212 lb 11.2 oz (96.5 kg)] 212 lb 11.2 oz (96.5 kg) (04/12 0556) Last BM Date: 09/22/16  Intake/Output from previous day: 04/11 0701 - 04/12 0700 In: 1258.8 [P.O.:480; I.V.:728.8; IV Piggyback:50] Out: 300 [Urine:300] Intake/Output this shift: No intake/output data recorded.  General appearance: alert, cooperative and no distress GI: soft, non-tender; bowel sounds normal; no masses,  no organomegaly  Lab Results:   Recent Labs  09/25/16 1118 09/26/16 0435  WBC 11.6* 10.8*  HGB 15.3 14.9  HCT 43.7 42.9  PLT 228 223   BMET  Recent Labs  09/25/16 1118 09/26/16 0435  NA 138 136  K 4.1 3.6  CL 102 103  CO2 28 26  GLUCOSE 117* 109*  BUN 21* 19  CREATININE 1.07 1.03  CALCIUM 9.2 8.8*   PT/INR  Recent Labs  09/25/16 1518  LABPROT 13.6  INR 1.04    Studies/Results: Dg Chest 2 View  Result Date: 09/25/2016 CLINICAL DATA:  Upper abdominal pain, nausea/vomiting EXAM: CHEST  2 VIEW COMPARISON:  01/30/2016 FINDINGS: Lungs are clear.  No pleural effusion or pneumothorax. The heart is normal in size. Left chest port terminates in the mid SVC. IMPRESSION: No evidence of acute cardiopulmonary disease. Electronically Signed   By: Julian Hy M.D.   On: 09/25/2016 13:00   Ct Abdomen Pelvis W Contrast  Result Date: 09/25/2016 CLINICAL DATA:  Epigastric pain for 3 days, some nausea and vomiting, history of carcinoma of the colon with surgery and chemotherapy EXAM: CT ABDOMEN AND PELVIS WITH CONTRAST TECHNIQUE:  Multidetector CT imaging of the abdomen and pelvis was performed using the standard protocol following bolus administration of intravenous contrast. CONTRAST:  139mL ISOVUE-300 IOPAMIDOL (ISOVUE-300) INJECTION 61% COMPARISON:  CT abdomen pelvis of 04/04/2016 FINDINGS: Lower chest: The lung bases are clear. The heart is within normal limits in size. Hepatobiliary: The liver enhances with no focal abnormality and no ductal dilatation is seen. The gallbladder however is somewhat distended and there does appear to be gallbladder wall thickening and pericholecystic strandiness worrisome for acute cholecystitis. Correlate clinically. No calcified gallstones are evident. The common bile duct is not dilated. No intrahepatic ductal dilatation is seen. If confirmation of acute cholecystitis is necessary ultrasound or nuclear medicine hepatobiliary scan may be helpful. Pancreas: The pancreas is normal in size. The pancreatic duct is not dilated. The peripancreatic fat planes are well preserved. Spleen: The spleen is inhomogeneous but within normal limits. A small splenule is noted medially and inferiorly. Adrenals/Urinary Tract: The adrenal glands appear normal. The kidneys enhance with no calculus or mass. On delayed images, the pelvocaliceal systems are unremarkable. The urinary bladder is not optimally distended. However there is abnormal higher attenuation material along the right superior posterior aspect urinary bladder worrisome for tumor. Some of this attenuation could be due to contrast, but the asymmetry is worrisome for possible bladder tumor. Stomach/Bowel: The stomach is not well distended. No small bowel distention is seen. By history the patient has had partial resection of the sigmoid colon. At the site of  resection, there is mucosal nodular thickening. This could be due to acute diverticulitis, but recurrence of tumor is also a consideration. Just anterior to this site of surgery there is a lobular soft tissue  mass of 3.7 x 2.9 cm very suspicious for recurrence of tumor, previously measuring 16 mm in diameter. There is feces throughout the more proximal colon. The terminal ileum is unremarkable. Vascular/Lymphatic: The abdominal aorta is normal in caliber. However, there is retroperitoneal adenopathy. Some of the larger nodes are adjacent to the aorta as on image 34 series 2 with a node measuring 17 mm in short axis diameter. On image 37 series 2 there is a node 16 mm in short axis diameter. Adenopathy also is noted in the pelvis near the area of previous resection and reanastomosis again worrisome for recurrence of carcinoma. A node on image 64 series 2 measures 15 mm. A right external iliac node on image 76 measures 2.3 cm in diameter. Reproductive: The prostate gland is normal in size. Other: A new soft tissue nodule anteriorly within the left abdomen on image 50 measures 8 mm could indicate peritoneal spread of disease. In addition there is nodular soft tissue within the right lower quadrant near the cecum again suspicious for peritoneal spread of tumor. Musculoskeletal: No lytic or blastic bone lesion is seen IMPRESSION: 1. Distended gallbladder with thickened wall and pericholecystic strandiness worrisome for acute cholecystitis. Correlate clinically. 2. Enlarging soft tissue mass adjacent to the area of the anastomosis of the rectosigmoid colon worrisome for recurrence of tumor. She adjacent adenopathy with retroperitoneal adenopathy noted as well. 3. Higher attenuation within the superior aspect of the urinary bladder to the right of midline worrisome for tumor. 4. There are a few small soft tissue nodules in the peritoneal cavity worrisome for peritoneal spread of disease. Electronically Signed   By: Ivar Drape M.D.   On: 09/25/2016 13:43   US Abdomen Limited Ruq  Result Date: 09/25/2016 CLINICAL DATA:  Upper abdominal pain for 3 days EXAM: US ABDOMEN LIMITED - RIGHT UPPER QUADRANT COMPARISON:  CT scan  09/25/2016 FINDINGS: Gallbladder: Small layering gallbladder sludge noted. No shadowing gallstones are noted within gallbladder. There is thickening of gallbladder wall up to 3.5 mm. Positive sonographic Murphy sign. Findings are highly suspicious for acute cholecystitis and clinical correlation is necessary. Common bile duct: Diameter: 2.3 mm in diameter within normal limits. Liver: No focal lesion identified. Within normal limits in parenchymal echogenicity. IMPRESSION: Small layering gallbladder sludge noted. No shadowing gallstones are noted within gallbladder. There is thickening of gallbladder wall up to 3.5 mm. Positive sonographic Murphy sign. Findings are highly suspicious for acute cholecystitis and clinical correlation is necessary. Normal CBD. Electronically Signed   By: Lahoma Crocker M.D.   On: 09/25/2016 14:44    Anti-infectives: Anti-infectives    Start     Dose/Rate Route Frequency Ordered Stop   09/25/16 2200  piperacillin-tazobactam (ZOSYN) IVPB 3.375 g     3.375 g 12.5 mL/hr over 240 Minutes Intravenous Every 8 hours 09/25/16 1502     09/25/16 1400  piperacillin-tazobactam (ZOSYN) IVPB 3.375 g     3.375 g 100 mL/hr over 30 Minutes Intravenous  Once 09/25/16 1358 09/25/16 1457      Assessment/Plan: Impression: Cholecystitis, resolving. Probable recurrent colon cancer. Plan: As patient is tolerating a regular diet well, he would like to go home and see how he does. I am okay with this. Would discharge him home on pain medications and ciprofloxacin by mouth. I will  see him next week in my office.  LOS: 2 days    Derek Rodriguez 09/27/2016

## 2016-09-27 NOTE — Progress Notes (Signed)
Patient discharged with instructions, prescription, and care notes.  Verbalized understanding via teach back.  IV was removed and the site was WNL. Patient voiced no further complaints or concerns at the time of discharge.  Appointments scheduled per instructions.  Patient left the floor via w/c family  And staff in stable condition. 

## 2016-09-27 NOTE — Consult Note (Signed)
[facility] Consultation Oncology  Name: Derek Rodriguez      MRN: [id number]    Location: L875/I433-29  Date: 09/27/2016 Time:2:23 PM   REFERRING PHYSICIAN:  Lala Lund, MD ([facility])  REASON FOR CONSULT:  Possible recurrence of colon cancer   DIAGNOSIS:  Enlarging soft tissue mass adjacent to the area of anastomosis of rectosigmoid colon with adjacent adenopathy and retroperitoneal adenopathy and higher attenuation with the superior aspect of urinary bladder; all worrisome for metastatic disease/recurrence given recent history of Stage IIIC colon cancer.  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 S/P partial colectomy by Dr. Arnoldo Morale on 10/03/2015.  Followed by 6 months of adjuvant chemotherapy initially treatment with CAPEOX x 1 cycle followed by FOLFOX x 11 cycles with last treatment on 05/04/2016 (requiring significant dose reductions and complicated by significant intolerance/depression).    Adenocarcinoma of sigmoid colon ([place])   09/09/2015 - 09/12/2015 Hospital Admission    Diverticulitis of colon with perforation      09/12/2015 Imaging    CT abd/pelvis- Continued severe inflammation of the proximal half of the sigmoid colon. There is a 2.6 cm oval low-attenuation structure extending between the posterior wall of the inflamed sigmoid colon and the dome of the bladder....      09/27/2015 Tumor Marker    CEA: 2.5       10/03/2015 Procedure    Partial colectomy, splenic flexure takedown by Dr. Arnoldo Morale      10/03/2015  Pathology Results    FULL THICKNESS INVASIVE MODERATELY TO POORLY DIFFERENTIATED ADENOCARCINOMA WITH SURFACE ULCERATION. TUMOR INVADES THROUGH MUSCULARIS MUCOSA AND PERFORATES SEROSAL SURFACE. - TUMOR MEASURES 10.3 CM IN GREATEST DIMENSION. - EXTENSIVE LYMPH/VASCULAR INVASI      10/13/2015 Pathology Results    MSI STABLE      10/20/2015 Imaging    CT chest- Negative. No evidence of metastatic disease or other active disease within the thorax.      11/04/2015 - 11/04/2015 Chemotherapy    CapeOX      11/12/2015 Miscellaneous    ED visit with nausea and decreased po intake      11/18/2015 Procedure    Port-A-Cath insertion by Dr. Arnoldo Morale      11/25/2015 Treatment Plan Change    From Xelox to FOLFOX      11/25/2015 - 05/04/2016 Chemotherapy    FOLFOX x 11 cycles (after 1 cycle of CAPEOX) with significant complications throughout treatment      01/06/2016 Treatment Plan Change    Oxaliplatin dose reduced by 20% for cycle #4 and 5FU bolus was discontinued.       04/05/2016 Imaging    CT abd/pelvis- Patient status post interval sigmoid colectomy. Anastomosis appears intact. Slightly anterior to the sigmoid colon is a small soft tissue nodule which may be postsurgical in etiology. Recommend attention on followup.  Interval decrease in size of preaortic lymph node.      09/25/2016 Imaging    CT abd/pelvis- 1. Distended gallbladder with thickened wall and pericholecystic strandiness worrisome for acute cholecystitis. Correlate clinically. 2. Enlarging soft tissue mass adjacent to the area of the anastomosis  of the rectosigmoid colon worrisome for recurrence of tumor. She adjacent adenopathy with retroperitoneal adenopathy noted as well. 3. Higher attenuation within the superior aspect of the urinary bladder to the right of midline worrisome for tumor. 4. There are a few small soft tissue nodules in the peritoneal cavity worrisome for peritoneal spread of disease.      09/25/2016 -   Hospital Admission    Admit date: 09/25/2016 Admission diagnosis: Acute cholecystitis Additional comments: Imaging of abd/pelvis also reveals findings concerning for metastatic/recurrence of disease.  CEA is elevated too.      09/27/2016 Relapse/Recurrence    Last Treatment Date: 05/04/2016 Radiographic findings concerning for recurrence of disease.  CEA elevated.        He presented to the ED on 09/25/2016 due to a 3 day history of nausea and vomiting with sharp, non-radiating epigastric abdominal pain associated with eating meals.  He reports that symptoms were worse at night.  In the ED, CT imaging was performed demonstrating signs of acute cholecystitis in addition to concerning findings suspicious for recurrence of malignancy.  He admits to improvement in his abdominal pain.  Today I reviewed his scans with him.  We reviewed his CEA value as well.  There is concern for recurrence/relapse of disease.  Based upon these findings, there is concern for metastatic and incurable disease.  We had a long discussion about these findings and how to proceed moving forward.  I have listed his options below in the assessment and plan.  He admits that he is unsure whether he will pursue systemic chemotherapy.  Review of Systems  Constitutional: Negative.  Negative for chills, fever and weight loss.  HENT: Negative.   Eyes: Negative.   Respiratory: Negative.  Negative for cough.   Cardiovascular: Negative.  Negative for chest pain.  Gastrointestinal: Positive for abdominal pain. Negative for blood in stool, constipation, diarrhea, melena, nausea and vomiting.  Genitourinary: Negative.   Musculoskeletal: Negative.   Skin: Negative.   Neurological: Negative.  Negative for weakness.  Endo/Heme/Allergies: Negative.   Psychiatric/Behavioral: Negative.       PAST MEDICAL HISTORY:   Past Medical History:  Diagnosis Date  . Adenocarcinoma of sigmoid colon ([place]) 10/18/2015  . Arthritis   .  Diverticulitis   . Hypertension     ALLERGIES: No Known Allergies    MEDICATIONS: I have reviewed the patient's current medications.     PAST SURGICAL HISTORY Past Surgical History:  Procedure Laterality Date  . BIOPSY  02/06/2016   Procedure: BIOPSY;  Surgeon: Daneil Dolin, MD;  [facility]: [facility];  Service: Endoscopy;;  esophagus  . ESOPHAGEAL DILATION  02/06/2016   Procedure: ESOPHAGEAL DILATION;  Surgeon: Daneil Dolin, MD;  Location: [facility];  Service: Endoscopy;;  . ESOPHAGOGASTRODUODENOSCOPY N/A 02/06/2016   Procedure: ESOPHAGOGASTRODUODENOSCOPY (EGD);  Surgeon: Daneil Dolin, MD;  Location: [facility];  Service: Endoscopy;  Laterality: N/A;  115  . PARTIAL COLECTOMY N/A 10/03/2015   Procedure: PARTIAL COLECTOMY;  Surgeon: Aviva Signs, MD;  Location: [facility];  Service: General;  Laterality: N/A;  . PORTACATH PLACEMENT N/A 11/18/2015   Procedure: INSERTION PORT-A-CATH LEFT SUBCLAVIAN;  Surgeon: Aviva Signs, MD;  Location: [facility];  Service: General;  Laterality: N/A;    FAMILY HISTORY: Family History  Problem Relation Age of Onset  . Diabetes Father     SOCIAL HISTORY:  reports that he quit smoking about 31 years ago. His smoking use included Cigars and Pipe. He quit smokeless tobacco  use about 15 months ago. His smokeless tobacco use included Snuff. He reports that he does not drink alcohol or use drugs.  PERFORMANCE STATUS: The patient's performance status is 1 - Symptomatic but completely ambulatory  PHYSICAL EXAM: Most Recent Vital Signs: Blood pressure 125/74, pulse 69, temperature 98.4 F (36.9 C), temperature source Oral, resp. rate 18, height 6' (1.829 m), weight 212 lb 11.2 oz (96.5 kg), SpO2 98 %. General appearance: alert, cooperative, appears stated age, no distress, mildly obese and wife at the bedside Head: Normocephalic, without obvious abnormality, atraumatic Eyes: negative findings: lids and lashes normal, conjunctivae and sclerae normal and  corneas clear Throat: lips, mucosa, and tongue normal; teeth and gums normal Extremities: extremities normal, atraumatic, no cyanosis or edema Skin: Skin color, texture, turgor normal. No rashes or lesions Neurologic: Grossly normal  LABORATORY DATA:  Results for orders placed or performed during the hospital encounter of 09/25/16 (from the past 48 hour(s))  Culture, blood (routine x 2)     Status: None (Preliminary result)   Collection Time: 09/25/16  3:13 PM  Result Value Ref Range   Specimen Description RIGHT ANTECUBITAL    Special Requests      BOTTLES DRAWN AEROBIC AND ANAEROBIC Blood Culture adequate volume   Culture NO GROWTH < 24 HOURS    Report Status PENDING   Protime-INR     Status: None   Collection Time: 09/25/16  3:18 PM  Result Value Ref Range   Prothrombin Time 13.6 11.4 - 15.2 seconds   INR 1.04   Culture, blood (routine x 2)     Status: None (Preliminary result)   Collection Time: 09/25/16  3:19 PM  Result Value Ref Range   Specimen Description LEFT ANTECUBITAL    Special Requests      BOTTLES DRAWN AEROBIC AND ANAEROBIC Blood Culture adequate volume   Culture NO GROWTH < 24 HOURS    Report Status PENDING   HIV antibody (Routine Testing)     Status: None   Collection Time: 09/25/16  5:03 PM  Result Value Ref Range   HIV Screen 4th Generation wRfx Non Reactive Non Reactive    Comment: (NOTE) Performed At: [facility] [place], [place] [id number] Lindon Romp MD EZ:[id number]   CBC     Status: Abnormal   Collection Time: 09/26/16  4:35 AM  Result Value Ref Range   WBC 10.8 (H) 4.0 - 10.5 K/uL   RBC 4.96 4.22 - 5.81 MIL/uL   Hemoglobin 14.9 13.0 - 17.0 g/dL   HCT 42.9 39.0 - 52.0 %   MCV 86.5 78.0 - 100.0 fL   MCH 30.0 26.0 - 34.0 pg   MCHC 34.7 30.0 - 36.0 g/dL   RDW 13.8 11.5 - 15.5 %   Platelets 223 150 - 400 K/uL  Comprehensive metabolic panel     Status: Abnormal   Collection Time: 09/26/16  4:35 AM  Result Value Ref  Range   Sodium 136 135 - 145 mmol/L   Potassium 3.6 3.5 - 5.1 mmol/L   Chloride 103 101 - 111 mmol/L   CO2 26 22 - 32 mmol/L   Glucose, Bld 109 (H) 65 - 99 mg/dL   BUN 19 6 - 20 mg/dL   Creatinine, Ser 1.03 0.61 - 1.24 mg/dL   Calcium 8.8 (L) 8.9 - 10.3 mg/dL   Total Protein 6.5 6.5 - 8.1 g/dL   Albumin 3.6 3.5 - 5.0 g/dL   AST 21 15 - 41 U/L  ALT 16 (L) 17 - 63 U/L   Alkaline Phosphatase 80 38 - 126 U/L   Total Bilirubin 1.2 0.3 - 1.2 mg/dL   GFR calc non Af Amer >60 >60 mL/min   GFR calc Af Amer >60 >60 mL/min    Comment: (NOTE) The eGFR has been calculated using the CKD EPI equation. This calculation has not been validated in all clinical situations. eGFR's persistently <60 mL/min signify possible Chronic Kidney Disease.    Anion gap 7 5 - 15     Lab Results  Component Value Date   CEA 7.0 (H) 09/25/2016    RADIOGRAPHY: US Abdomen Limited Ruq  Result Date: 09/25/2016 CLINICAL DATA:  Upper abdominal pain for 3 days EXAM: US ABDOMEN LIMITED - RIGHT UPPER QUADRANT COMPARISON:  CT scan 09/25/2016 FINDINGS: Gallbladder: Small layering gallbladder sludge noted. No shadowing gallstones are noted within gallbladder. There is thickening of gallbladder wall up to 3.5 mm. Positive sonographic Murphy sign. Findings are highly suspicious for acute cholecystitis and clinical correlation is necessary. Common bile duct: Diameter: 2.3 mm in diameter within normal limits. Liver: No focal lesion identified. Within normal limits in parenchymal echogenicity. IMPRESSION: Small layering gallbladder sludge noted. No shadowing gallstones are noted within gallbladder. There is thickening of gallbladder wall up to 3.5 mm. Positive sonographic Murphy sign. Findings are highly suspicious for acute cholecystitis and clinical correlation is necessary. Normal CBD. Electronically Signed   By: Lahoma Crocker M.D.   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 S/P partial colectomy by Dr. Arnoldo Morale on 10/03/2015.  Followed by 6 months of adjuvant chemotherapy initially treatment with CAPEOX x 1 cycle followed by FOLFOX x 11 cycles with last treatment on 05/04/2016 (requiring significant dose reductions and complicated by significant intolerance/depression).  Throughout treatment discussion regarding de-bulking surgery and HIPEC at [facility] took place repeatedly, but the patient declined ("I am not having any more surgery).  The risks and benefits were discussed with him repeatedly as well.  Also, the significant risk of recurrence of disease based upon high-risk features at time of diagnosis was also discussed throughout treatment.  His options moving forward follow:  1. Biopsy and if positive for recurrence of disease, he will need this sent for [facility] testing including MSI, and he will need CT chest imaging IF he wishes to pursue therapy.  2. Biopsy to confirm recurrence of disease without any treatment  3. Repeat CT imaging of abdomen in 8-12 weeks and monitor CEA level without biopsy upfront  He is resistant to pursue systemic chemotherapy.  He is educated that surgery and radiation are not a treatment option at this time.  He is educated on there incurability of his disease, yet he is a candidate for treatment.  He did not tolerate FOLFOX well and therefore future systemic chemotherapy would be expected to be difficult to tolerate.  I broached FOLFIRI -based therapy.  I reviewed the common side effects of Irinotecan therapy.    Palliative care was consulted as  well and we appreciate their input.  Acute cholecystitis seems to be improving with conservative management.  He has been followed by Dr. Arnoldo Morale, General Surgery.  I have reviewed his note in detail.  The patient has outpatient follow-up with Dr. Arnoldo Morale next week.  Will defer the decision of pursuing biopsy to the patient and he will discuss with Dr. Arnoldo Morale.  He has oncology follow-up on 10/12/2016.  We will keep this appointment for the time being.  Patient and plan discussed with Dr. Twana First and she is in agreement with the aforementioned.   Robynn Pane, PA-C 09/27/2016 2:23 PM

## 2016-09-27 NOTE — Discharge Instructions (Signed)
Cholecystitis °Cholecystitis is inflammation of the gallbladder. It is often called a gallbladder attack. The gallbladder is a pear-shaped organ that lies beneath the liver on the right side of the body. The gallbladder stores bile, which is a fluid that helps the body to digest fats. If bile builds up in your gallbladder, your gallbladder becomes inflamed. This condition may occur suddenly (be acute). Repeat episodes of acute cholecystitis or prolonged episodes may lead to a long-term (chronic) condition. Cholecystitis is serious and it requires treatment. °What are the causes? °The most common cause of this condition is gallstones. Gallstones can block the tube (duct) that carries bile out of your gallbladder. This causes bile to build up. Other causes of this condition include: °· Damage to the gallbladder due to a decrease in blood flow. °· Infections in the bile ducts. °· Scars or kinks in the bile ducts. °· Tumors in the liver, pancreas, or gallbladder. °What increases the risk? °This condition is more likely to develop in: °· People who have sickle cell disease. °· People who take birth control pills or use estrogen. °· People who have alcoholic liver disease. °· People who have liver cirrhosis. °· People who have their nutrition delivered through a vein (parenteral nutrition). °· People who do not eat or drink (do fasting) for a long period of time. °· People who are obese. °· People who have rapid weight loss. °· People who are pregnant. °· People who have increased triglyceride levels. °· People who have pancreatitis. °What are the signs or symptoms? °Symptoms of this condition include: °· Abdominal pain, especially in the upper right area of the abdomen. °· Abdominal tenderness or bloating. °· Nausea. °· Vomiting. °· Fever. °· Chills. °· Yellowing of the skin and the whites of the eyes (jaundice). °How is this diagnosed? °This condition is diagnosed with a medical history and physical exam. You may also  have other tests, including: °· Imaging tests, such as: °¨ An ultrasound of the gallbladder. °¨ A CT scan of the abdomen. °¨ A gallbladder nuclear scan (HIDA scan). This scan allows your health care provider to see the bile moving from your liver to your gallbladder and to your small intestine. °¨ MRI. °· Blood tests, such as: °¨ A complete blood count, because the white blood cell count may be higher than normal. °¨ Liver function tests, because some levels may be higher than normal with certain types of gallstones. °How is this treated? °Treatment may include: °· Fasting for a certain amount of time. °· IV fluids. °· Medicine to treat pain or vomiting. °· Antibiotic medicine. °· Surgery to remove your gallbladder (cholecystectomy). This may happen immediately or at a later time. °Follow these instructions at home: °Home care will depend on your treatment. In general: °· Take over-the-counter and prescription medicines only as told by your health care provider. °· If you were prescribed an antibiotic medicine, take it as told by your health care provider. Do not stop taking the antibiotic even if you start to feel better. °· Follow instructions from your health care provider about what to eat or drink. When you are allowed to eat, avoid eating or drinking anything that triggers your symptoms. °· Keep all follow-up visits as told by your health care provider. This is important. °Contact a health care provider if: °· Your pain is not controlled with medicine. °· You have a fever. °Get help right away if: °· Your pain moves to another part of your abdomen or to your   back. °· You continue to have symptoms or you develop new symptoms even with treatment. °This information is not intended to replace advice given to you by your health care provider. Make sure you discuss any questions you have with your health care provider. °Document Released: 06/04/2005 Document Revised: 10/13/2015 Document Reviewed:  09/15/2014 °Elsevier Interactive Patient Education © 2017 [employer] ° °

## 2016-09-27 NOTE — Care Management Note (Signed)
Case Management Note  Patient Details  Name: Tashaun Obey MRN: [id number] Date of Birth: 02-12-1961  Subjective/Objective:                  Pt admitted with cholecystitis. Chart reviewed for CM needs. Pt is from home, lives with family and is ind with ADL's. No HH or DME pta. Pt has PCP, transportation and insurance with drug coverage.   Action/Plan: Pt discharging home today with self care.   Expected Discharge Date:  09/27/16               Expected Discharge Plan:  Home/Self Care  In-House Referral:  Hospice / Palliative Care  Discharge planning Services  CM Consult  Post Acute Care Choice:  NA Choice offered to:  NA  Status of Service:  Completed, signed off  Sherald Barge, RN 09/27/2016, 12:48 PM

## 2016-09-28 ENCOUNTER — Encounter (HOSPITAL_COMMUNITY): Payer: BLUE CROSS/BLUE SHIELD

## 2016-09-30 LAB — CULTURE, BLOOD (ROUTINE X 2)
CULTURE: NO GROWTH
CULTURE: NO GROWTH
SPECIAL REQUESTS: ADEQUATE
Special Requests: ADEQUATE

## 2016-10-01 NOTE — Discharge Summary (Signed)
Physician Discharge Summary  Derek Rodriguez TMA:[id number] DOB: [birth year] DOA: 09/25/2016  PCP: Derek Kaufmann, MD  Admit date: 09/25/2016 Discharge date: 09/27/2016  Admitted From: Home Disposition:  Home.   Recommendations for Outpatient Follow-up:  1. Follow up with PCP in 1-2 weeks 2. Please obtain BMP/CBC in one week 3. Please follow up with surgery as recommended.    Discharge Condition:stable.  CODE STATUS: full code.  Diet recommendation: Heart Healthy  Brief/Interim Summary: MarkMarshallis a 56 y.o.male,, Diverticulitis, no carcinoma of the colon status post partial resection of colon followed by chemotherapy all of which finished in November 2017. Patient was doing well and was scheduled for outpatient routine oncology follow-up this week however for the last 3 days he started experiencing some nausea and vomiting along with sharp nonradiating epigastric abdominal pain mostly related to having meals, CT shows acute cholecytitis along with colon cancer re occurrence. gen surgery consulted.   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 gen surgery consulted and recommendations given.  Pain resolved, recommended starting diet and if able to tolerate, can discharge home with oral antibiotics and follow up with surgery as outpatient.    ? Re occurrence of colon cancer: History of adenocarcinoma of the colon status post colon resection in April 2017 followed by 5 months of chemotherapy finished in November 2017 Elevated CEA levels. CT abdomen shows Enlarging soft tissue mass adjacent to the area of the anastomosis of the rectosigmoid colon worrisome for recurrence of tumor. Oncology consulted  and plan for outpatient follow up .    GERD: ON ppi.   Discharge Instructions  Discharge Instructions    Diet general    Complete by:  As directed    Discharge instructions    Complete by:  As directed    Please follow up with surgery as needed.  Please follow up with oncology in 1 to 2 weeks. Please return to ED if pain or symptoms of nausea and vomiting re occur.     Allergies as of 09/27/2016   No Known Allergies     Medication List    STOP taking these medications   prochlorperazine 10 MG tablet Commonly known as:  COMPAZINE     TAKE these medications   bisacodyl 10 MG suppository Commonly known as:  DULCOLAX Place 1 suppository (10 mg total) rectally daily as needed for mild constipation or moderate constipation.   ciprofloxacin 500 MG tablet Commonly known as:  CIPRO Take 1 tablet (500 mg total) by mouth 2 (two) times daily.   gabapentin 300 MG capsule Commonly known as:  NEURONTIN Take 1 capsule at HS x 3 days, then 2 capsules at HS x 3 days, and then 3 capsules at HS thereafter   heparin flush (porcine) 100 UNIT/ML injection Flush port with 5cc of heparin lock flush.   ibuprofen 200 MG tablet Commonly known as:  ADVIL,MOTRIN Take 400 mg by mouth every 6 (six) hours as needed for mild pain or moderate pain.   lidocaine-prilocaine cream Commonly known as:  EMLA Apply a quarter size amount to port site 1 hour prior to chemo. Do not rub in. Cover with plastic wrap.   LORazepam 0.5 MG tablet Commonly known as:  ATIVAN Take 1 tablet (0.5 mg  total) by mouth at bedtime as needed for anxiety or sleep.   Normal Saline Flush 0.9 % Soln Flush port with 10 ml of normal saline.   omeprazole 40 MG capsule Commonly known as:  PRILOSEC Take 1 capsule (40 mg total) by mouth daily.   ondansetron 4 MG tablet Commonly known as:  ZOFRAN Take 1 tablet (4 mg total) by mouth every 8 (eight) hours as needed for nausea or vomiting.   oxyCODONE-acetaminophen 7.5-325 MG  tablet Commonly known as:  PERCOCET Take 1 tablet by mouth every 6 (six) hours as needed for moderate pain.   senna-docusate 8.6-50 MG tablet Commonly known as:  Senokot-S Take 2 tablets by mouth at bedtime.   zolpidem 10 MG tablet Commonly known as:  AMBIEN Take 1 tablet (10 mg total) by mouth at bedtime as needed for sleep.      Follow-up Information    Derek Signs, MD. Schedule an appointment as soon as possible for a visit on 10/04/2016.   Specialty:  General Surgery Contact information: [id number] [address] [phone]        Derek Kaufmann, MD. Schedule an appointment as soon as possible for a visit in 1 week(s).   Specialty:  Family Medicine Why:  post hospitalization visit.  Contact information: [place] [address] [phone]          No Known Allergies  Consultations: Palliative care Surgery   Procedures/Studies: Dg Chest 2 View  Result Date: 09/25/2016 CLINICAL DATA:  Upper abdominal pain, nausea/vomiting EXAM: CHEST  2 VIEW COMPARISON:  01/30/2016 FINDINGS: Lungs are clear.  No pleural effusion or pneumothorax. The heart is normal in size. Left chest port terminates in the mid SVC. IMPRESSION: No evidence of acute cardiopulmonary disease. Electronically Signed   By: Derek Rodriguez M.D.   On: 09/25/2016 13:00   Ct Abdomen Pelvis W Contrast  Result Date: 09/25/2016 CLINICAL DATA:  Epigastric pain for 3 days, some nausea and vomiting, history of carcinoma of the colon with surgery and chemotherapy EXAM: CT ABDOMEN AND PELVIS WITH CONTRAST TECHNIQUE: Multidetector CT imaging of the abdomen and pelvis was performed using the standard protocol following bolus administration of intravenous contrast. CONTRAST:  118mL ISOVUE-300 IOPAMIDOL (ISOVUE-300) INJECTION 61% COMPARISON:  CT abdomen pelvis of 04/04/2016 FINDINGS: Lower chest: The lung bases are clear. The heart is within normal limits in size. Hepatobiliary: The  liver enhances with no focal abnormality and no ductal dilatation is seen. The gallbladder however is somewhat distended and there does appear to be gallbladder wall thickening and pericholecystic strandiness worrisome for acute cholecystitis. Correlate clinically. No calcified gallstones are evident. The common bile duct is not dilated. No intrahepatic ductal dilatation is seen. If confirmation of acute cholecystitis is necessary ultrasound or nuclear medicine hepatobiliary scan may be helpful. Pancreas: The pancreas is normal in size. The pancreatic duct is not dilated. The peripancreatic fat planes are well preserved. Spleen: The spleen is inhomogeneous but within normal limits. A small splenule is noted medially and inferiorly. Adrenals/Urinary Tract: The adrenal glands appear normal. The kidneys enhance with no calculus or mass. On delayed images, the pelvocaliceal systems are unremarkable. The urinary bladder is not optimally distended. However there is abnormal higher attenuation material along the right superior posterior aspect urinary bladder worrisome for tumor. Some of this attenuation could be due to contrast, but the asymmetry is worrisome for possible bladder tumor. Stomach/Bowel: The stomach is not well distended. No small bowel distention is seen. By history  the patient has had partial resection of the sigmoid colon. At the site of resection, there is mucosal nodular thickening. This could be due to acute diverticulitis, but recurrence of tumor is also a consideration. Just anterior to this site of surgery there is a lobular soft tissue mass of 3.7 x 2.9 cm very suspicious for recurrence of tumor, previously measuring 16 mm in diameter. There is feces throughout the more proximal colon. The terminal ileum is unremarkable. Vascular/Lymphatic: The abdominal aorta is normal in caliber. However, there is retroperitoneal adenopathy. Some of the larger nodes are adjacent to the aorta as on image 34 series  2 with a node measuring 17 mm in short axis diameter. On image 37 series 2 there is a node 16 mm in short axis diameter. Adenopathy also is noted in the pelvis near the area of previous resection and reanastomosis again worrisome for recurrence of carcinoma. A node on image 64 series 2 measures 15 mm. A right external iliac node on image 76 measures 2.3 cm in diameter. Reproductive: The prostate gland is normal in size. Other: A new soft tissue nodule anteriorly within the left abdomen on image 50 measures 8 mm could indicate peritoneal spread of disease. In addition there is nodular soft tissue within the right lower quadrant near the cecum again suspicious for peritoneal spread of tumor. Musculoskeletal: No lytic or blastic bone lesion is seen IMPRESSION: 1. Distended gallbladder with thickened wall and pericholecystic strandiness worrisome for acute cholecystitis. Correlate clinically. 2. Enlarging soft tissue mass adjacent to the area of the anastomosis of the rectosigmoid colon worrisome for recurrence of tumor. She adjacent adenopathy with retroperitoneal adenopathy noted as well. 3. Higher attenuation within the superior aspect of the urinary bladder to the right of midline worrisome for tumor. 4. There are a few small soft tissue nodules in the peritoneal cavity worrisome for peritoneal spread of disease. Electronically Signed   By: Ivar Drape M.D.   On: 09/25/2016 13:43   US Abdomen Limited Ruq  Result Date: 09/25/2016 CLINICAL DATA:  Upper abdominal pain for 3 days EXAM: US ABDOMEN LIMITED - RIGHT UPPER QUADRANT COMPARISON:  CT scan 09/25/2016 FINDINGS: Gallbladder: Small layering gallbladder sludge noted. No shadowing gallstones are noted within gallbladder. There is thickening of gallbladder wall up to 3.5 mm. Positive sonographic Murphy sign. Findings are highly suspicious for acute cholecystitis and clinical correlation is necessary. Common bile duct: Diameter: 2.3 mm in diameter within normal  limits. Liver: No focal lesion identified. Within normal limits in parenchymal echogenicity. IMPRESSION: Small layering gallbladder sludge noted. No shadowing gallstones are noted within gallbladder. There is thickening of gallbladder wall up to 3.5 mm. Positive sonographic Murphy sign. Findings are highly suspicious for acute cholecystitis and clinical correlation is necessary. Normal CBD. Electronically Signed   By: Lahoma Crocker M.D.   On: 09/25/2016 14:44       Subjective: No new complaints.   Discharge Exam: Vitals:   09/26/16 2151 09/27/16 0556  BP: (!) 104/59 125/74  Pulse: 95 69  Resp: 20 18  Temp: 97.6 F (36.4 C) 98.4 F (36.9 C)   Vitals:   09/26/16 1407 09/26/16 1938 09/26/16 2151 09/27/16 0556  BP: 127/79  (!) 104/59 125/74  Pulse: 81  95 69  Resp: 18  20 18   Temp: 98.9 F (37.2 C)  97.6 F (36.4 C) 98.4 F (36.9 C)  TempSrc: Oral  Oral Oral  SpO2: 98% 96% 100% 98%  Weight:    96.5 kg (212 lb  11.2 oz)  Height:        General: Pt is alert, awake, not in acute distress Cardiovascular: RRR, S1/S2 +, no rubs, no gallops Respiratory: CTA bilaterally, no wheezing, no rhonchi Abdominal: Soft, NT, ND, bowel sounds + Extremities: no edema, no cyanosis    The results of significant diagnostics from this hospitalization (including imaging, microbiology, ancillary and laboratory) are listed below for reference.     Microbiology: Recent Results (from the past 240 hour(s))  Culture, blood (routine x 2)     Status: None   Collection Time: 09/25/16  3:13 PM  Result Value Ref Range Status   Specimen Description RIGHT ANTECUBITAL  Final   Special Requests   Final    BOTTLES DRAWN AEROBIC AND ANAEROBIC Blood Culture adequate volume   Culture NO GROWTH 5 DAYS  Final   Report Status 09/30/2016 FINAL  Final  Culture, blood (routine x 2)     Status: None   Collection Time: 09/25/16  3:19 PM  Result Value Ref Range Status   Specimen Description LEFT ANTECUBITAL  Final    Special Requests   Final    BOTTLES DRAWN AEROBIC AND ANAEROBIC Blood Culture adequate volume   Culture NO GROWTH 5 DAYS  Final   Report Status 09/30/2016 FINAL  Final     Labs: BNP (last 3 results)  Recent Labs  01/30/16 1546 02/03/16 0915  BNP 396.0* 58.8   Basic Metabolic Panel:  Recent Labs Lab 09/25/16 1118 09/26/16 0435  NA 138 136  K 4.1 3.6  CL 102 103  CO2 28 26  GLUCOSE 117* 109*  BUN 21* 19  CREATININE 1.07 1.03  CALCIUM 9.2 8.8*   Liver Function Tests:  Recent Labs Lab 09/25/16 1118 09/26/16 0435  AST 26 21  ALT 20 16*  ALKPHOS 90 80  BILITOT 0.7 1.2  PROT 6.9 6.5  ALBUMIN 3.9 3.6    Recent Labs Lab 09/25/16 1118  LIPASE 19   No results for input(s): AMMONIA in the last 168 hours. CBC:  Recent Labs Lab 09/25/16 1118 09/26/16 0435  WBC 11.6* 10.8*  HGB 15.3 14.9  HCT 43.7 42.9  MCV 85.4 86.5  PLT 228 223   Cardiac Enzymes:  Recent Labs Lab 09/25/16 1118  TROPONINI <0.03   BNP: Invalid input(s): POCBNP CBG: No results for input(s): GLUCAP in the last 168 hours. D-Dimer No results for input(s): DDIMER in the last 72 hours. Hgb A1c No results for input(s): HGBA1C in the last 72 hours. Lipid Profile No results for input(s): CHOL, HDL, LDLCALC, TRIG, CHOLHDL, LDLDIRECT in the last 72 hours. Thyroid function studies No results for input(s): TSH, T4TOTAL, T3FREE, THYROIDAB in the last 72 hours.  Invalid input(s): FREET3 Anemia work up No results for input(s): VITAMINB12, FOLATE, FERRITIN, TIBC, IRON, RETICCTPCT in the last 72 hours. Urinalysis    Component Value Date/Time   COLORURINE YELLOW 11/09/2015 2045   APPEARANCEUR CLEAR 11/09/2015 2045   LABSPEC >1.030 (H) 11/09/2015 2045   PHURINE 5.5 11/09/2015 2045   GLUCOSEU NEGATIVE 11/09/2015 2045   HGBUR NEGATIVE 11/09/2015 2045   BILIRUBINUR NEGATIVE 11/09/2015 2045   KETONESUR NEGATIVE 11/09/2015 2045   PROTEINUR NEGATIVE 11/09/2015 2045   NITRITE NEGATIVE 11/09/2015  2045   LEUKOCYTESUR NEGATIVE 11/09/2015 2045   Sepsis Labs Invalid input(s): PROCALCITONIN,  WBC,  LACTICIDVEN Microbiology Recent Results (from the past 240 hour(s))  Culture, blood (routine x 2)     Status: None   Collection Time: 09/25/16  3:13 PM  Result Value Ref Range Status   Specimen Description RIGHT ANTECUBITAL  Final   Special Requests   Final    BOTTLES DRAWN AEROBIC AND ANAEROBIC Blood Culture adequate volume   Culture NO GROWTH 5 DAYS  Final   Report Status 09/30/2016 FINAL  Final  Culture, blood (routine x 2)     Status: None   Collection Time: 09/25/16  3:19 PM  Result Value Ref Range Status   Specimen Description LEFT ANTECUBITAL  Final   Special Requests   Final    BOTTLES DRAWN AEROBIC AND ANAEROBIC Blood Culture adequate volume   Culture NO GROWTH 5 DAYS  Final   Report Status 09/30/2016 FINAL  Final     Time coordinating discharge: Over 30 minutes  SIGNED:   Hosie Poisson, MD  [facility] 10/01/2016, 10:06 AM Pager   If 7PM-7AM, please contact night-coverage [email or web address] Password [email or web address]

## 2016-10-02 ENCOUNTER — Other Ambulatory Visit (HOSPITAL_COMMUNITY): Payer: Self-pay | Admitting: Oncology

## 2016-10-02 DIAGNOSIS — C187 Malignant neoplasm of sigmoid colon: Secondary | ICD-10-CM

## 2016-10-02 MED ORDER — OXYCODONE-ACETAMINOPHEN 7.5-325 MG PO TABS: 1.0000 | ORAL_TABLET | Freq: Four times a day (QID) | ORAL | 0 refills | Status: DC | PRN

## 2016-10-04 ENCOUNTER — Ambulatory Visit (INDEPENDENT_AMBULATORY_CARE_PROVIDER_SITE_OTHER): Payer: BLUE CROSS/BLUE SHIELD | Admitting: General Surgery

## 2016-10-04 ENCOUNTER — Encounter: Payer: Self-pay | Admitting: General Surgery

## 2016-10-04 VITALS — BP 117/79 | HR 86 | Temp 98.4°F | Resp 18 | Ht 72.0 in | Wt 212.0 lb

## 2016-10-04 DIAGNOSIS — C189 Malignant neoplasm of colon, unspecified: Secondary | ICD-10-CM

## 2016-10-04 MED ORDER — PEG 3350-KCL-NABCB-NACL-NASULF 236 G PO SOLR: 4000.0000 mL | Freq: Once | ORAL | 0 refills | Status: AC

## 2016-10-04 MED ORDER — PEG 3350-KCL-NABCB-NACL-NASULF 236 G PO SOLR: 4000.0000 mL | Freq: Once | ORAL | 0 refills | Status: DC

## 2016-10-04 NOTE — Patient Instructions (Signed)
Colonoscopy, Adult A colonoscopy is an exam to look at the entire large intestine. During the exam, a lubricated, bendable tube is inserted into the anus and then passed into the rectum, colon, and other parts of the large intestine. A colonoscopy is often done as a part of normal colorectal screening or in response to certain symptoms, such as anemia, persistent diarrhea, abdominal pain, and blood in the stool. The exam can help screen for and diagnose medical problems, including:  Tumors.  Polyps.  Inflammation.  Areas of bleeding. Tell a health care provider about:  Any allergies you have.  All medicines you are taking, including vitamins, herbs, eye drops, creams, and over-the-counter medicines.  Any problems you or family members have had with anesthetic medicines.  Any blood disorders you have.  Any surgeries you have had.  Any medical conditions you have.  Any problems you have had passing stool. What are the risks? Generally, this is a safe procedure. However, problems may occur, including:  Bleeding.  A tear in the intestine.  A reaction to medicines given during the exam.  Infection (rare). What happens before the procedure? Eating and drinking restrictions  Follow instructions from your health care provider about eating and drinking, which may include:  A few days before the procedure - follow a low-fiber diet. Avoid nuts, seeds, dried fruit, raw fruits, and vegetables.  1-3 days before the procedure - follow a clear liquid diet. Drink only clear liquids, such as clear broth or bouillon, black coffee or tea, clear juice, clear soft drinks or sports drinks, gelatin dessert, and popsicles. Avoid any liquids that contain red or purple dye.  On the day of the procedure - do not eat or drink anything during the 2 hours before the procedure, or within the time period that your health care provider recommends. Bowel prep  If you were prescribed an oral bowel prep to  clean out your colon:  Take it as told by your health care provider. Starting the day before your procedure, you will need to drink a large amount of medicated liquid. The liquid will cause you to have multiple loose stools until your stool is almost clear or light green.  If your skin or anus gets irritated from diarrhea, you may use these to relieve the irritation:  Medicated wipes, such as adult wet wipes with aloe and vitamin E.  A skin soothing-product like petroleum jelly.  If you vomit while drinking the bowel prep, take a break for up to 60 minutes and then begin the bowel prep again. If vomiting continues and you cannot take the bowel prep without vomiting, call your health care provider. General instructions   Ask your health care provider about changing or stopping your regular medicines. This is especially important if you are taking diabetes medicines or blood thinners.  Plan to have someone take you home from the hospital or clinic. What happens during the procedure?  An IV tube may be inserted into one of your veins.  You will be given medicine to help you relax (sedative).  To reduce your risk of infection:  Your health care team will wash or sanitize their hands.  Your anal area will be washed with soap.  You will be asked to lie on your side with your knees bent.  Your health care provider will lubricate a long, thin, flexible tube. The tube will have a camera and a light on the end.  The tube will be inserted into your anus.    The tube will be gently eased through your rectum and colon.  Air will be delivered into your colon to keep it open. You may feel some pressure or cramping.  The camera will be used to take images during the procedure.  A small tissue sample may be removed from your body to be examined under a microscope (biopsy). If any potential problems are found, the tissue will be sent to a lab for testing.  If small polyps are found, your health  care provider may remove them and have them checked for cancer cells.  The tube that was inserted into your anus will be slowly removed. The procedure may vary among health care providers and hospitals. What happens after the procedure?  Your blood pressure, heart rate, breathing rate, and blood oxygen level will be monitored until the medicines you were given have worn off.  Do not drive for 24 hours after the exam.  You may have a small amount of blood in your stool.  You may pass gas and have mild abdominal cramping or bloating due to the air that was used to inflate your colon during the exam.  It is up to you to get the results of your procedure. Ask your health care provider, or the department performing the procedure, when your results will be ready. This information is not intended to replace advice given to you by your health care provider. Make sure you discuss any questions you have with your health care provider. Document Released: 06/01/2000 Document Revised: 04/04/2016 Document Reviewed: 08/16/2015 Elsevier Interactive Patient Education  2017 [employer]  

## 2016-10-04 NOTE — H&P (Signed)
Derek Rodriguez is an 56 y.o. male.   Chief Complaint:   History of colon carcinoma, need for follow-up colonoscopy HPI:  Patient is a 56 year old white male status post partial colectomy in April 2017 who now presents for a follow-up colonoscopy.  He was recently found on CT scan of the abdomen to have possible intra-abdominal recurrence of the cancer.  He also was recently treated for acute cholecystitis secondary to cholelithiasis.  He denies any blood per rectum, weight loss, diarrhea, or constipation.  Past Medical History:  Diagnosis Date  . 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 Arthritis   . Diverticulitis   . Hypertension     Past Surgical History:  Procedure Laterality Date  . BIOPSY  02/06/2016   Procedure: BIOPSY;  Surgeon: Daneil Dolin, MD;  Location: [facility];  Service: Endoscopy;;  esophagus  . ESOPHAGEAL DILATION  02/06/2016   Procedure: ESOPHAGEAL DILATION;  Surgeon: Daneil Dolin, MD;  Location: [facility];  Service: Endoscopy;;  . ESOPHAGOGASTRODUODENOSCOPY N/A 02/06/2016   Procedure: ESOPHAGOGASTRODUODENOSCOPY (EGD);  Surgeon: Daneil Dolin, MD;  Location: [facility];  Service: Endoscopy;  Laterality: N/A;  115  . PARTIAL COLECTOMY N/A 10/03/2015   Procedure: PARTIAL COLECTOMY;  Surgeon: Aviva Signs, MD;  Location: [facility];  Service: General;  Laterality: N/A;  . PORTACATH PLACEMENT N/A 11/18/2015   Procedure: INSERTION PORT-A-CATH LEFT SUBCLAVIAN;  Surgeon: Aviva Signs, MD;  Location: [facility];  Service: General;  Laterality: N/A;    Family History  Problem Relation Age of Onset  . Diabetes Father    Social History:  reports that he quit smoking about 31 years ago. His smoking use included Cigars and Pipe. He quit smokeless tobacco use about 15 months ago. His smokeless tobacco use included Snuff. He reports that he does not drink alcohol or use drugs.  Allergies: No Known Allergies  No prescriptions prior to admission.    No results found  for this or any previous visit (from the past 48 hour(s)). No results found.  Review of Systems  Constitutional: Negative.   HENT: Negative.   Eyes: Negative.   Respiratory: Negative.   Cardiovascular: Negative.   Gastrointestinal: Positive for abdominal pain. Negative for blood in stool and melena.  Genitourinary: Negative.   Musculoskeletal: Negative.   Skin: Negative.   Neurological: Negative.   Endo/Heme/Allergies: Negative.   Psychiatric/Behavioral: Negative.     There were no vitals taken for this visit. Physical Exam  Vitals reviewed. Constitutional: He is oriented to person, place, and time. He appears well-developed and well-nourished.  HENT:  Head: Normocephalic and atraumatic.  Neck: Normal range of motion. Neck supple.  Cardiovascular: Normal rate, regular rhythm and normal heart sounds.   Respiratory: Effort normal and breath sounds normal. He has no wheezes. He has no rales.  GI: Soft. Bowel sounds are normal. He exhibits no distension and no mass. There is no rebound and no guarding.  Minimal tenderness to deep palpation in the right upper quadrant.  Neurological: He is alert and oriented to person, place, and time.  Skin: Skin is warm and dry.     Assessment/Plan   Colon carcinoma, status post partial colectomy in April 2017   Plan: Patient scheduled for colonoscopy on 10/09/2016.  Risks and benefits of the procedure were fully explained to the patient, who gave informed consent.  GoLYTELY prep has been prescribed.  Aviva Signs, MD 10/04/2016, 9:23 PM

## 2016-10-04 NOTE — Progress Notes (Signed)
Subjective:     Derek Rodriguez    Here for follow-up examination after hospitalization for cholecystitis secondary to cholelithiasis.  At the time of hospitalization, his condition improved and his pain somewhat subsided.  He tolerated regular diet well and was discharged home for follow-up in my office.  He states he has intermittent right upper quadrant abdominal pain, but it has decreased.  He is on ciprofloxacin for the infection.  Also at the time of hospitalization, CT scan of the abdomen revealed possible recurrence of his colon cancer.  His colon surgery was back in April 2017. Objective:    BP 117/79   Pulse 86   Temp 98.4 F (36.9 C)   Resp 18   Ht 6' (1.829 m)   Wt 212 lb (96.2 kg)   BMI 28.75 kg/m   General:  alert, cooperative and no distress     Abdomen is soft with minimal tenderness to deep palpation in the right quadrant.  No masses palpable.  No rigidity is noted.  No hepatosplenomegaly, masses, or hernias are identified.     Assessment:    Cholecystitis, cholelithiasis, history of colon carcinoma with possible recurrence    Plan:     I told patient that he was due for a follow-up colonoscopy for his colon cancer.  I would like to perform this first prior to his cholecystectomy and possible biopsy of the masses seen on CT scan.  He understands this and agrees.  Scheduled for a colonoscopy on 10/09/2016.  Risks and benefits of the procedure including bleeding and perforation were fully explained to the patient, who gave informed consent.

## 2016-10-09 ENCOUNTER — Ambulatory Visit (HOSPITAL_COMMUNITY)
Admission: RE | Admit: 2016-10-09 | Discharge: 2016-10-09 | Disposition: A | Discharge status: Home / Self Care | Payer: BLUE CROSS/BLUE SHIELD | Source: Ambulatory Visit | Attending: General Surgery | Admitting: General Surgery

## 2016-10-09 ENCOUNTER — Encounter (HOSPITAL_COMMUNITY): Payer: Self-pay

## 2016-10-09 ENCOUNTER — Encounter (HOSPITAL_COMMUNITY)
Admission: RE | Disposition: A | Discharge status: Home / Self Care | Payer: Self-pay | Source: Ambulatory Visit | Attending: General Surgery

## 2016-10-09 DIAGNOSIS — K6389 Other specified diseases of intestine: Secondary | ICD-10-CM | POA: Insufficient documentation

## 2016-10-09 DIAGNOSIS — Z87891 Personal history of nicotine dependence: Secondary | ICD-10-CM | POA: Diagnosis not present

## 2016-10-09 DIAGNOSIS — K5730 Diverticulosis of large intestine without perforation or abscess without bleeding: Secondary | ICD-10-CM | POA: Diagnosis not present

## 2016-10-09 DIAGNOSIS — I10 Essential (primary) hypertension: Secondary | ICD-10-CM | POA: Insufficient documentation

## 2016-10-09 DIAGNOSIS — Z1211 Encounter for screening for malignant neoplasm of colon: Secondary | ICD-10-CM | POA: Diagnosis present

## 2016-10-09 DIAGNOSIS — Z85038 Personal history of other malignant neoplasm of large intestine: Secondary | ICD-10-CM | POA: Diagnosis not present

## 2016-10-09 DIAGNOSIS — M1990 Unspecified osteoarthritis, unspecified site: Secondary | ICD-10-CM | POA: Insufficient documentation

## 2016-10-09 DIAGNOSIS — C187 Malignant neoplasm of sigmoid colon: Secondary | ICD-10-CM | POA: Diagnosis not present

## 2016-10-09 HISTORY — PX: BIOPSY: SHX5522

## 2016-10-09 HISTORY — PX: COLONOSCOPY: SHX5424

## 2016-10-09 SURGERY — COLONOSCOPY
Anesthesia: Moderate Sedation

## 2016-10-09 MED ORDER — STERILE WATER FOR IRRIGATION IR SOLN
Status: DC | PRN
  Administered 2016-10-09: 08:00:00

## 2016-10-09 MED ORDER — MEPERIDINE HCL 50 MG/ML IJ SOLN
INTRAMUSCULAR | Status: AC
  Filled 2016-10-09: qty 1

## 2016-10-09 MED ORDER — MIDAZOLAM HCL 5 MG/5ML IJ SOLN
INTRAMUSCULAR | Status: AC
  Filled 2016-10-09: qty 5

## 2016-10-09 MED ORDER — MIDAZOLAM HCL 5 MG/5ML IJ SOLN
INTRAMUSCULAR | Status: DC | PRN
  Administered 2016-10-09: 4 mg via INTRAVENOUS
  Administered 2016-10-09: 1 mg via INTRAVENOUS

## 2016-10-09 MED ORDER — MEPERIDINE HCL 50 MG/ML IJ SOLN
INTRAMUSCULAR | Status: DC | PRN
  Administered 2016-10-09: 50 mg via INTRAVENOUS

## 2016-10-09 MED ORDER — SODIUM CHLORIDE 0.9 % IV SOLN
INTRAVENOUS | Status: DC
  Administered 2016-10-09: 1000 mL via INTRAVENOUS

## 2016-10-09 NOTE — Op Note (Signed)
[facility] Patient Name: Derek Rodriguez Procedure Date: 10/09/2016 7:13 AM MRN: [id number] Date of Birth: [birth year] Attending MD: Aviva Signs , MD CSN: [id number] Age: 56 Admit Type: Outpatient Procedure:                Colonoscopy Indications:              High risk colon cancer surveillance: Personal                            history of colon cancer Providers:                Aviva Signs, MD, Otis Peak B. Sharon Seller, RN, Claudius Sis, Technologist Referring MD:              Medicines:                Midazolam 5 mg IV, Meperidine 50 mg IV Complications:            No immediate complications. Estimated blood loss:                            Minimal. Estimated Blood Loss:     Estimated blood loss was minimal. Procedure:                Pre-Anesthesia Assessment:                           - Prior to the procedure, a History and Physical                            was performed, and patient medications and                            allergies were reviewed. The patient is competent.                            The risks and benefits of the procedure and the                            sedation options and risks were discussed with the                            patient. All questions were answered and informed                            consent was obtained. Patient identification and                            proposed procedure were verified by the physician,                            the nurse and the technician in the endoscopy  suite. Mental Status Examination: alert and                            oriented. Airway Examination: normal oropharyngeal                            airway and neck mobility. Respiratory Examination:                            clear to auscultation. CV Examination: normal.                            Prophylactic Antibiotics: The patient does not                            require prophylactic antibiotics.  Prior                            Anticoagulants: The patient has taken no previous                            anticoagulant or antiplatelet agents. ASA Grade                            Assessment: II - A patient with mild systemic                            disease. After reviewing the risks and benefits,                            the patient was deemed in satisfactory condition to                            undergo the procedure. The anesthesia plan was to                            use moderate sedation / analgesia (conscious                            sedation). Immediately prior to administration of                            medications, the patient was re-assessed for                            adequacy to receive sedatives. The heart rate,                            respiratory rate, oxygen saturations, blood                            pressure, adequacy of pulmonary ventilation, and  response to care were monitored throughout the                            procedure. The physical status of the patient was                            re-assessed after the procedure.                           After obtaining informed consent, the colonoscope                            was passed under direct vision. Throughout the                            procedure, the patient's blood pressure, pulse, and                            oxygen saturations were monitored continuously. The                            Colonoscope was introduced through the anus and                            advanced to the the sigmoid colon. No anatomical                            landmarks were photographed. The colonoscopy was                            performed without difficulty. The patient tolerated                            the procedure well. The quality of the bowel                            preparation was poor. The total duration of the                            procedure was 6  minutes. Scope In: 7:35:09 AM Scope Out: 7:41:32 AM Total Procedure Duration: 0 hours 6 minutes 23 seconds  Findings:      The perianal and digital rectal examinations were normal.      An area of severely friable (with contact bleeding) mucosa was found in       the distal sigmoid colon. Biopsies were taken with a cold forceps for       histology.      Mass near obstructing at 20cm from the anus. Suspicious for recurrent       colon cancer. Impression:               - Preparation of the colon was poor.                           Dionisio David (with contact bleeding) mucosa in the  distal sigmoid colon. Biopsied.                           - Malignant-appearing tumor in the colon. Biopsied. Moderate Sedation:      Moderate (conscious) sedation was administered by the endoscopy nurse       and supervised by the endoscopist. The following parameters were       monitored: oxygen saturation, heart rate, blood pressure, and response       to care. Recommendation:           - Written discharge instructions were provided to                            the patient.                           - The signs and symptoms of potential delayed                            complications were discussed with the patient.                           - Patient has a contact number available for                            emergencies.                           - Return to normal activities tomorrow.                           - Clear liquid diet.                           - Await pathology results.                           - Continue present medications.                           - No repeat colonoscopy due to need for surgery. Procedure Code(s):        --- Professional ---                           [phone], 63, Colonoscopy, flexible; with biopsy,                            single or multiple Diagnosis Code(s):        --- Professional ---                           F74.944, Personal history  of other malignant                            neoplasm of large intestine  D49.0, Neoplasm of unspecified behavior of                            digestive system CPT copyright 2016 [employer]. All rights reserved. The codes documented in this report are preliminary and upon coder review may  be revised to meet current compliance requirements. Aviva Signs, MD Aviva Signs, MD 10/09/2016 7:54:27 AM This report has been signed electronically. Number of Addenda: 0

## 2016-10-09 NOTE — OR Nursing (Signed)
Derek Rodriguez was at [facility] on 10/09/16 with her husband for a procedure

## 2016-10-09 NOTE — Discharge Instructions (Signed)
Colonoscopy, Adult, Care After This sheet gives you information about how to care for yourself after your procedure. Your health care provider may also give you more specific instructions. If you have problems or questions, contact your health care provider. What can I expect after the procedure? After the procedure, it is common to have:  A small amount of blood in your stool for 24 hours after the procedure.  Some gas.  Mild abdominal cramping or bloating. Follow these instructions at home: General instructions    For the first 24 hours after the procedure:  Do not drive or use machinery.  Do not sign important documents.  Do not drink alcohol.  Do your regular daily activities at a slower pace than normal.  Eat soft, easy-to-digest foods.  Rest often.  Take over-the-counter or prescription medicines only as told by your health care provider.  It is up to you to get the results of your procedure. Ask your health care provider, or the department performing the procedure, when your results will be ready. Relieving cramping and bloating   Try walking around when you have cramps or feel bloated.  Apply heat to your abdomen as told by your health care provider. Use a heat source that your health care provider recommends, such as a moist heat pack or a heating pad.  Place a towel between your skin and the heat source.  Leave the heat on for 20-30 minutes.  Remove the heat if your skin turns bright red. This is especially important if you are unable to feel pain, heat, or cold. You may have a greater risk of getting burned. Eating and drinking   Drink enough fluid to keep your urine clear or pale yellow.  Resume your normal diet as instructed by your health care provider. Avoid heavy or fried foods that are hard to digest.  Avoid drinking alcohol for as long as instructed by your health care provider. Contact a health care provider if:  You have blood in your stool 2-3  days after the procedure. Get help right away if:  You have more than a small spotting of blood in your stool.  You pass large blood clots in your stool.  Your abdomen is swollen.  You have nausea or vomiting.  You have a fever.  You have increasing abdominal pain that is not relieved with medicine. This information is not intended to replace advice given to you by your health care provider. Make sure you discuss any questions you have with your health care provider. Document Released: 01/17/2004 Document Revised: 02/27/2016 Document Reviewed: 08/16/2015 Elsevier Interactive Patient Education  2017 [place] Liquid Diet A clear liquid diet means that you only have liquids that you can see through. You do not eat any food on this diet. Most people need to follow this diet for only a short time. What do I need to know about this diet?  A clear liquid is a liquid that you can see through when you hold it up to a light.  This diet does not give you all the nutrients that you need. Choose a variety of the liquids that your doctor says you can drink on this diet. That way, you will get as many nutrients as possible.  If you are not sure whether you can have certain items, ask your doctor. What can I have?  Water and flavored water.  Fruit juices that do not have pulp, such as cranberry juice and apple juice.  Tea and coffee without milk or cream.  Clear bouillon or broth.  Broth-based soups that have been strained.  Flavored gelatins.  Honey.  Sugar water.  Frozen ice or frozen ice pops that do not have any milk, yogurt, fruit pieces, or fruit pulp in them.  Clear sodas.  Clear sports drinks. The items listed above may not be a complete list of recommended liquids. Contact your food and nutrition expert (dietitian) for more options.  What can I not have?  Juices that have pulp.  Milk.  Cream or cream-based soups.  Yogurt. The items listed above may  not be a complete list of liquids to avoid. Contact your food and nutrition expert for more information.  Summary  A clear liquid diet is a diet that includes only liquids that you can see through.  The goal of this diet is to help you recover.  Make sure to avoid liquids with milk, cream, or pulp while you are on this diet. This information is not intended to replace advice given to you by your health care provider. Make sure you discuss any questions you have with your health care provider. Document Released: 05/17/2008 Document Revised: 01/16/2016 Document Reviewed: 05/01/2013 Elsevier Interactive Patient Education  2017 [employer].

## 2016-10-09 NOTE — Interval H&P Note (Signed)
History and Physical Interval Note:  10/09/2016 7:31 AM  Derek Rodriguez  has presented today for surgery, with the diagnosis of personal history colon cancer  The various methods of treatment have been discussed with the patient and family. After consideration of risks, benefits and other options for treatment, the patient has consented to  Procedure(s): COLONOSCOPY (N/A) as a surgical intervention .  The patient's history has been reviewed, patient examined, no change in status, stable for surgery.  I have reviewed the patient's chart and labs.  Questions were answered to the patient's satisfaction.     Aviva Signs

## 2016-10-10 NOTE — Patient Instructions (Signed)
Derek Rodriguez  10/10/2016     @PREFPERIOPPHARMACY @   Your procedure is scheduled on  10/12/2016   Report to [facility] at  730   A.M.  Call this number if you have problems the morning of surgery:  [phone]   Remember:  Do not eat food or drink liquids after midnight.  Take these medicines the morning of surgery with A SIP OF WATER  prilosec, zofran, oxycodone.   Do not wear jewelry, make-up or nail polish.  Do not wear lotions, powders, or perfumes, or deoderant.  Do not shave 48 hours prior to surgery.  Men may shave face and neck.  Do not bring valuables to the hospital.  [facility] is not responsible for any belongings or valuables.  Contacts, dentures or bridgework may not be worn into surgery.  Leave your suitcase in the car.  After surgery it may be brought to your room.  For patients admitted to the hospital, discharge time will be determined by your treatment team.  Patients discharged the day of surgery will not be allowed to drive home.   Name and phone number of your driver:   family Special instructions:  Follow the diet and pep instructions given to you by Dr Arnoldo Morale.  Please read over the following fact sheets that you were given. Anesthesia Post-op Instructions and Care and Recovery After Surgery       Open Cholecystectomy Open cholecystectomy is surgery to remove the gallbladder. The gallbladder is a pear-shaped organ that lies beneath the liver on the right side of the body. The gallbladder stores bile, which is a fluid that helps the body to digest fats. Cholecystectomy is often done for inflammation of the gallbladder (cholecystitis). This condition is usually caused by a buildup of gallstones (cholelithiasis) in the gallbladder. Gallstones can block the flow of bile, which can result in inflammation and pain. In severe cases, emergency surgery may be required. Tell a health care provider about:  Any allergies you  have.  All medicines you are taking, including vitamins, herbs, eye drops, creams, and over-the-counter medicines.  Any problems you or family members have had with anesthetic medicines.  Any blood disorders you have.  Any surgeries you have had.  Any medical conditions you have.  Whether you are pregnant or may be pregnant. What are the risks? Generally, this is a safe procedure. However, problems may occur, including:  Infection.  Bleeding.  Allergic reactions to medicines.  Damage to other structures or organs.  A stone remaining in the common bile duct. The common bile duct carries bile from the gallbladder into the small intestine.  A bile leak from the cyst duct that is clipped when your gallbladder is removed. What happens before the procedure? Staying hydrated  Follow instructions from your health care provider about hydration, which may include:  Up to 2 hours before the procedure - you may continue to drink clear liquids, such as water, clear fruit juice, black coffee, and plain tea. Eating and drinking restrictions  Follow instructions from your health care provider about eating and drinking, which may include:  8 hours before the procedure - stop eating heavy meals or foods such as meat, fried foods, or fatty foods.  6 hours before the procedure - stop eating light meals or foods, such as toast or cereal.  6 hours before the procedure - stop drinking milk or drinks that contain milk.  2 hours before the procedure - stop drinking clear liquids. Medicines   Ask your health care provider about:  Changing or stopping your regular medicines. This is especially important if you are taking diabetes medicines or blood thinners.  Taking medicines such as aspirin and ibuprofen. These medicines can thin your blood. Do not take these medicines before your procedure if your health care provider instructs you not to.  You may be given antibiotic medicine to help  prevent infection. General instructions   Let your health care provider know if you develop a cold or an infection before surgery.  Plan to have someone take you home from the hospital or clinic.  Ask your health care provider how your surgical site will be marked or identified. What happens during the procedure?  To reduce your risk of infection:  Your health care team will wash or sanitize their hands.  Your skin will be washed with soap.  Hair may be removed from the surgical area.  An IV tube may be inserted into one of your veins.  You will be given one or more of the following:  A medicine to help you relax (sedative).  A medicine to make you fall asleep (general anesthetic).  A breathing tube will be placed in your mouth.  Your surgeon will make a cut (incision) in the upper abdomen to access your gallbladder.  Your gallbladder will be removed.  Your common bile duct may be examined. If stones are found in the common bile duct, they may be removed.  After your gallbladder has been removed, the incisions will be closed with stitches (sutures), skin glue, or staples.  Your incision will be covered with a bandage (dressing). The procedure may vary among health care providers and hospitals. What happens after the procedure?  Your blood pressure, heart rate, breathing rate, and blood oxygen level will be monitored until the medicines you were given have worn off.  You will be given medicines as needed to control your pain.  Do not drive for 24 hours if you were given a sedative. This information is not intended to replace advice given to you by your health care provider. Make sure you discuss any questions you have with your health care provider. Document Released: 02/24/2002 Document Revised: 12/24/2015 Document Reviewed: 11/21/2015 Elsevier Interactive Patient Education  2017 [place].  Open Cholecystectomy, Care After This sheet gives you information about  how to care for yourself after your procedure. Your health care provider may also give you more specific instructions. If you have problems or questions, contact your health care provider. What can I expect after the procedure? After the procedure, it is common to have:  Pain at your incision site. You will be given medicines to control this pain.  Mild nausea or vomiting. Follow these instructions at home: Incision care    Follow instructions from your health care provider about how to take care of your incision. Make sure you:  Wash your hands with soap and water before you change your bandage (dressing). If soap and water are not available, use hand sanitizer.  Change your dressing as told by your health care provider.  Leave stitches (sutures), skin glue, or adhesive strips in place. These skin closures may need to be in place for 2 weeks or longer. If adhesive strip edges start to loosen and curl up, you may trim the loose edges. Do not remove adhesive strips completely unless your health care provider tells you to do that.  Do not take baths, swim, or use a hot tub until your health care provider approves. Ask your health care provider if you can take showers. You may only be allowed to take sponge baths for bathing.  Check your incision area every day for signs of infection. Check for:  More redness, swelling, or pain.  More fluid or blood.  Warmth.  Pus or a bad smell. Activity   Do not drive or use heavy machinery while taking prescription pain medicine.  Do not lift anything that is heavier than 10 lb (4.5 kg) until your health care provider approves.  Do not play contact sports until your health care provider approves.  Do not drive for 24 hours if you were given a medicine to help you relax (sedative).  Rest as needed. Do not return to work or school until your health care provider approves. General instructions   Take over-the-counter and prescription medicines  only as told by your health care provider.  To prevent or treat constipation while you are taking prescription pain medicine, your health care provider may recommend that you:  Drink enough fluid to keep your urine clear or pale yellow.  Take over-the-counter or prescription medicines.  Eat foods that are high in fiber, such as fresh fruits and vegetables, whole grains, and beans.  Limit foods that are high in fat and processed sugars, such as fried and sweet foods. Contact a health care provider if:  You develop a rash.  You have more redness, swelling, or pain around your incision.  You have more fluid or blood coming from your incision.  Your incision feels warm to the touch.  You have pus or a bad smell coming from your incision.  You have a fever.  Your incision breaks open. Get help right away if:  You have trouble breathing.  You have chest pain.  You have increasing pain in your shoulders.  You faint or feel dizzy when you stand.  You have severe pain in your abdomen.  You have nausea or vomiting that lasts for more than one day.  You have leg pain. This information is not intended to replace advice given to you by your health care provider. Make sure you discuss any questions you have with your health care provider. Document Released: 09/20/2003 Document Revised: 12/24/2015 Document Reviewed: 11/21/2015 Elsevier Interactive Patient Education  2017 [place].  Open Colectomy An open colectomy is surgery to remove part or all of the large intestine (colon). This procedure may be used to treat several conditions, including:  Inflammation and infection of the colon (diverticulitis).  Tumors or masses in the colon.  Inflammatory bowel disease, such as Crohn disease or ulcerative colitis.  Bleeding from the colon.  Blockage or obstruction of the colon. Tell a health care provider about:  Any allergies you have.  All medicines you are taking,  including vitamins, herbs, eye drops, creams, and over-the-counter medicines.  Any problems you or family members have had with anesthetic medicines.  Any blood disorders you have.  Any surgeries you have had.  Any medical conditions you have.  Whether you are pregnant or may be pregnant.  Whether you smoke or use tobacco products. These can affect your body's reaction to anesthesia. What are the risks? Generally, this is a safe procedure. However, problems may occur, including:  Infection.  Bleeding.  Allergic reactions to medicines.  Damage to other structures or organs.  Pneumonia.  The incision opening up.  Tissues from inside the  abdomen bulging through the incision (hernia).  Reopening of the colon where it was stitched or stapled together.  A blood clot forming in a vein and traveling to the lungs.  Future blockage of the small intestine from scar tissue. What happens before the procedure? Staying hydrated  Follow instructions from your health care provider about hydration, which may include:  Up to 2 hours before the procedure - you may continue to drink clear liquids, such as water, clear fruit juice, black coffee, and plain tea. Eating and drinking restrictions  Follow instructions from your health care provider about eating and drinking, which may include:  8 hours before the procedure - stop eating heavy meals or foods such as meat, fried foods, or fatty foods.  6 hours before the procedure - stop eating light meals or foods, such as toast or cereal.  6 hours before the procedure - stop drinking milk or drinks that contain milk.  2 hours before the procedure - stop drinking clear liquids. Bowel prep  In some cases, you may be prescribed an oral bowel prep to clean out your colon. If so:  Take it as told by your health care provider. Starting the day before your procedure, you may need to drink a large amount of medicated liquid. The liquid will cause  you to have multiple loose stools until your stool is almost clear or light green.  Follow instructions from your health care provider about eating and drinking restrictions during bowel prep. Medicines   Ask your health care provider about:  Changing or stopping your regular medicines or vitamins. This is especially important if you are taking diabetes medicines, blood thinners, or vitamin E.  Taking medicines such as aspirin and ibuprofen. These medicines can thin your blood. Do not take these medicines before your procedure if your health care provider instructs you not to.  If you were prescribed an antibiotic medicine, take it as told by your health care provider. General instructions   Bring loose-fitting, comfortable clothing and slip-on shoes that you can put on without bending over.  Make sure to see your health care provider for any tests that you need before the procedure, such as:  Blood tests.  A test to check the heart's rhythm (electrocardiogram, ECG).  A CT scan of your abdomen.  Urine tests.  Colonoscopy.  Plan to have someone take you home from the hospital or clinic.  Arrange for someone to help you with your activities during your recovery. What happens during the procedure?  To reduce your risk of infection:  Your health care team will wash or sanitize their hands.  Your skin will be washed with soap.  Hair may be removed from the surgical area.  An IV tube will be inserted into one of your veins. The tube will be used to give you medicines and fluids.  You will be given a medicine to make you fall asleep (general anesthetic). You may also be given a medicine to help you relax (sedative).  Small monitors will be connected to your body. They will be used to check your heart, blood pressure, and oxygen level.  A breathing tube may be placed into your lungs during the procedure.  A thin, flexible tube (catheter) will be placed into your bladder to  drain urine.  A tube may be inserted through your nose and into your stomach (nasogastric tube, or NG tube). The tube is used to remove stomach fluids after surgery until the intestines start working  again.  An incision will be made in your abdomen.  Clamps or staples will be put on your colon.  The part of the colon between the clamps or staples will be removed.  The ends of the colon that remain will be stitched or stapled together.  The incision in your abdomen will be closed with stitches (sutures) or staples.  The incision will be covered with a bandage (dressing).  A small opening (stoma) may be created in your lower abdomen. A removable, external pouch (ostomy pouch) will be attached to the stoma. This pouch will collect stool outside of your body. Stool passes through the stoma and into the pouch instead of through your anus. The procedure may vary among health care providers and hospitals. What happens after the procedure?  Your blood pressure, heart rate, breathing rate, and blood oxygen level will be monitored until the medicines you were given have worn off.  You may continue to receive fluids and medicines through an IV tube.  You will start on a clear liquid diet and gradually go back to a normal diet.  Do not drive until your health care provider approves.  You may have some pain in your abdomen. You will be given pain medicine to control the pain.  You will be encouraged to do the following:  Do breathing exercises to prevent pneumonia.  Get up and start walking within a day after surgery. You should try to get up 5-6 times a day. This information is not intended to replace advice given to you by your health care provider. Make sure you discuss any questions you have with your health care provider. Document Released: 04/01/2009 Document Revised: 03/05/2016 Document Reviewed: 03/05/2016 Elsevier Interactive Patient Education  2017 [place].  Open Colectomy,  Care After This sheet gives you information about how to care for yourself after your procedure. Your health care provider may also give you more specific instructions. If you have problems or questions, contact your health care provider. What can I expect after the procedure? After the procedure, it is common to have:  Pain in your abdomen, especially along your incision.  Tiredness. Your energy level will return to normal over the next several weeks.  Constipation.  Nausea.  Difficulty urinating. Follow these instructions at home: Activity   You may be able to return to most of your normal activities within 1-2 weeks, such as working, walking up stairs, and sexual activity.  Avoid activities that require a lot of energy for 4-6 weeks after surgery, such as running, climbing, and lifting heavy objects. Ask your health care provider what activities are safe for you.  Take rest breaks during the day as needed.  Do not drive for 1-2 weeks or until your health care provider says that it is safe.  Do not drive or use heavy machinery while taking prescription pain medicines.  Do not lift anything that is heavier than 10 lb (4.3 kg) until your health care provider says that it is safe. Incision care   Follow instructions from your health care provider about how to take care of your incision. Make sure you:  Wash your hands with soap and water before you change your bandage (dressing). If soap and water are not available, use hand sanitizer.  Change your dressing as told by your health care provider.  Leave stitches (sutures) or staples in place. These skin closures may need to stay in place for 2 weeks or longer.  Avoid wearing tight clothing around  your incision.  Protect your incision area from the sun.  Check your incision area every day for signs of infection. Check for:  More redness, swelling, or pain.  More fluid or blood.  Warmth.  Pus or a bad smell. General  instructions   Do not take baths, swim, or use a hot tub until your health care provider approves. Ask your health care provider when you may shower.  Take over-the-counter and prescription medicines, including stool softeners, only as told by your health care provider.  Eat a low-fat and low-fiber diet for the first 4 weeks after surgery.  Keep all follow-up visits as told by your health care provider. This is important. Contact a health care provider if:  You have more redness, swelling, or pain around your incision.  You have more fluid or blood coming from your incision.  Your incision feels warm to the touch.  You have pus or a bad smell coming from your incision.  You have a fever or chills.  You do not have a bowel movement 2-3 days after surgery.  You cannot eat or drink for 24 hours or more.  You have persistent nausea and vomiting.  You have abdominal pain that gets worse and does not get better with medicine. Get help right away if:  You have chest pain.  You have shortness of breath.  You have pain or swelling in your legs.  Your incision breaks open after your sutures or staples have been removed.  You have bleeding from the rectum. This information is not intended to replace advice given to you by your health care provider. Make sure you discuss any questions you have with your health care provider. Document Released: 12/26/2010 Document Revised: 03/05/2016 Document Reviewed: 03/05/2016 Elsevier Interactive Patient Education  2017 [place] Anesthesia, Adult General anesthesia is the use of medicines to make a person "go to sleep" (be unconscious) for a medical procedure. General anesthesia is often recommended when a procedure:  Is long.  Requires you to be still or in an unusual position.  Is major and can cause you to lose blood.  Is impossible to do without general anesthesia. The medicines used for general anesthesia are called  general anesthetics. In addition to making you sleep, the medicines:  Prevent pain.  Control your blood pressure.  Relax your muscles. Tell a health care provider about:  Any allergies you have.  All medicines you are taking, including vitamins, herbs, eye drops, creams, and over-the-counter medicines.  Any problems you or family members have had with anesthetic medicines.  Types of anesthetics you have had in the past.  Any bleeding disorders you have.  Any surgeries you have had.  Any medical conditions you have.  Any history of heart or lung conditions, such as heart failure, sleep apnea, or chronic obstructive pulmonary disease (COPD).  Whether you are pregnant or may be pregnant.  Whether you use tobacco, alcohol, marijuana, or street drugs.  Any history of Armed forces logistics/support/administrative officer.  Any history of depression or anxiety. What are the risks? Generally, this is a safe procedure. However, problems may occur, including:  Allergic reaction to anesthetics.  Lung and heart problems.  Inhaling food or liquids from your stomach into your lungs (aspiration).  Injury to nerves.  Waking up during your procedure and being unable to move (rare).  Extreme agitation or a state of mental confusion (delirium) when you wake up from the anesthetic.  Air in the bloodstream, which can lead  to stroke. These problems are more likely to develop if you are having a major surgery or if you have an advanced medical condition. You can prevent some of these complications by answering all of your health care provider's questions thoroughly and by following all pre-procedure instructions. General anesthesia can cause side effects, including:  Nausea or vomiting  A sore throat from the breathing tube.  Feeling cold or shivery.  Feeling tired, washed out, or achy.  Sleepiness or drowsiness.  Confusion or agitation. What happens before the procedure? Staying hydrated  Follow instructions  from your health care provider about hydration, which may include:  Up to 2 hours before the procedure - you may continue to drink clear liquids, such as water, clear fruit juice, black coffee, and plain tea. Eating and drinking restrictions  Follow instructions from your health care provider about eating and drinking, which may include:  8 hours before the procedure - stop eating heavy meals or foods such as meat, fried foods, or fatty foods.  6 hours before the procedure - stop eating light meals or foods, such as toast or cereal.  6 hours before the procedure - stop drinking milk or drinks that contain milk.  2 hours before the procedure - stop drinking clear liquids. Medicines   Ask your health care provider about:  Changing or stopping your regular medicines. This is especially important if you are taking diabetes medicines or blood thinners.  Taking medicines such as aspirin and ibuprofen. These medicines can thin your blood. Do not take these medicines before your procedure if your health care provider instructs you not to.  Taking new dietary supplements or medicines. Do not take these during the week before your procedure unless your health care provider approves them.  If you are told to take a medicine or to continue taking a medicine on the day of the procedure, take the medicine with sips of water. General instructions    Ask if you will be going home the same day, the following day, or after a longer hospital stay.  Plan to have someone take you home.  Plan to have someone stay with you for the first 24 hours after you leave the hospital or clinic.  For 3-6 weeks before the procedure, try not to use any tobacco products, such as cigarettes, chewing tobacco, and e-cigarettes.  You may brush your teeth on the morning of the procedure, but make sure to spit out the toothpaste. What happens during the procedure?  You will be given anesthetics through a mask and through  an IV tube in one of your veins.  You may receive medicine to help you relax (sedative).  As soon as you are asleep, a breathing tube may be used to help you breathe.  An anesthesia specialist will stay with you throughout the procedure. He or she will help keep you comfortable and safe by continuing to give you medicines and adjusting the amount of medicine that you get. He or she will also watch your blood pressure, pulse, and oxygen levels to make sure that the anesthetics do not cause any problems.  If a breathing tube was used to help you breathe, it will be removed before you wake up. The procedure may vary among health care providers and hospitals. What happens after the procedure?  You will wake up, often slowly, after the procedure is complete, usually in a recovery area.  Your blood pressure, heart rate, breathing rate, and blood oxygen level will be  monitored until the medicines you were given have worn off.  You may be given medicine to help you calm down if you feel anxious or agitated.  If you will be going home the same day, your health care provider may check to make sure you can stand, drink, and urinate.  Your health care providers will treat your pain and side effects before you go home.  Do not drive for 24 hours if you received a sedative.  You may:  Feel nauseous and vomit.  Have a sore throat.  Have mental slowness.  Feel cold or shivery.  Feel sleepy.  Feel tired.  Feel sore or achy, even in parts of your body where you did not have surgery. This information is not intended to replace advice given to you by your health care provider. Make sure you discuss any questions you have with your health care provider. Document Released: 09/11/2007 Document Revised: 11/15/2015 Document Reviewed: 05/19/2015 Elsevier Interactive Patient Education  2017 [place] Anesthesia, Adult, Care After These instructions provide you with information about  caring for yourself after your procedure. Your health care provider may also give you more specific instructions. Your treatment has been planned according to current medical practices, but problems sometimes occur. Call your health care provider if you have any problems or questions after your procedure. What can I expect after the procedure? After the procedure, it is common to have:  Vomiting.  A sore throat.  Mental slowness. It is common to feel:  Nauseous.  Cold or shivery.  Sleepy.  Tired.  Sore or achy, even in parts of your body where you did not have surgery. Follow these instructions at home: For at least 24 hours after the procedure:   Do not:  Participate in activities where you could fall or become injured.  Drive.  Use heavy machinery.  Drink alcohol.  Take sleeping pills or medicines that cause drowsiness.  Make important decisions or sign legal documents.  Take care of children on your own.  Rest. Eating and drinking   If you vomit, drink water, juice, or soup when you can drink without vomiting.  Drink enough fluid to keep your urine clear or pale yellow.  Make sure you have little or no nausea before eating solid foods.  Follow the diet recommended by your health care provider. General instructions   Have a responsible adult stay with you until you are awake and alert.  Return to your normal activities as told by your health care provider. Ask your health care provider what activities are safe for you.  Take over-the-counter and prescription medicines only as told by your health care provider.  If you smoke, do not smoke without supervision.  Keep all follow-up visits as told by your health care provider. This is important. Contact a health care provider if:  You continue to have nausea or vomiting at home, and medicines are not helpful.  You cannot drink fluids or start eating again.  You cannot urinate after 8-12 hours.  You  develop a skin rash.  You have fever.  You have increasing redness at the site of your procedure. Get help right away if:  You have difficulty breathing.  You have chest pain.  You have unexpected bleeding.  You feel that you are having a life-threatening or urgent problem. This information is not intended to replace advice given to you by your health care provider. Make sure you discuss any questions you have with your health  care provider. Document Released: 09/10/2000 Document Revised: 11/07/2015 Document Reviewed: 05/19/2015 Elsevier Interactive Patient Education  2017 [employer].

## 2016-10-11 ENCOUNTER — Encounter: Payer: Self-pay | Admitting: General Surgery

## 2016-10-11 ENCOUNTER — Ambulatory Visit (INDEPENDENT_AMBULATORY_CARE_PROVIDER_SITE_OTHER): Payer: BLUE CROSS/BLUE SHIELD | Admitting: General Surgery

## 2016-10-11 ENCOUNTER — Encounter (HOSPITAL_COMMUNITY): Payer: Self-pay

## 2016-10-11 ENCOUNTER — Encounter (HOSPITAL_COMMUNITY)
Admission: RE | Admit: 2016-10-11 | Discharge: 2016-10-11 | Disposition: A | Discharge status: Home / Self Care | Payer: BLUE CROSS/BLUE SHIELD | Source: Ambulatory Visit | Attending: General Surgery | Admitting: General Surgery

## 2016-10-11 VITALS — BP 134/88 | HR 79 | Temp 97.8°F | Resp 18 | Ht 72.0 in | Wt 208.0 lb

## 2016-10-11 DIAGNOSIS — K819 Cholecystitis, unspecified: Secondary | ICD-10-CM | POA: Insufficient documentation

## 2016-10-11 DIAGNOSIS — C187 Malignant neoplasm of sigmoid colon: Secondary | ICD-10-CM | POA: Diagnosis not present

## 2016-10-11 DIAGNOSIS — Z01818 Encounter for other preprocedural examination: Secondary | ICD-10-CM | POA: Insufficient documentation

## 2016-10-11 HISTORY — DX: Gastro-esophageal reflux disease without esophagitis: K21.9

## 2016-10-11 HISTORY — DX: Polyneuropathy, unspecified: G62.9

## 2016-10-11 HISTORY — DX: Anxiety disorder, unspecified: F41.9

## 2016-10-11 LAB — COMPREHENSIVE METABOLIC PANEL
ALK PHOS: 89 U/L (ref 38–126)
ALT: 21 U/L (ref 17–63)
AST: 34 U/L (ref 15–41)
Albumin: 4.1 g/dL (ref 3.5–5.0)
Anion gap: 12 (ref 5–15)
BUN: 15 mg/dL (ref 6–20)
CO2: 24 mmol/L (ref 22–32)
CREATININE: 1.21 mg/dL (ref 0.61–1.24)
Calcium: 9.3 mg/dL (ref 8.9–10.3)
Chloride: 98 mmol/L — ABNORMAL LOW (ref 101–111)
GFR calc Af Amer: >60 mL/min (ref >60)
Glucose, Bld: 69 mg/dL (ref 65–99)
Potassium: 3.7 mmol/L (ref 3.5–5.1)
Sodium: 134 mmol/L — ABNORMAL LOW (ref 135–145)
TOTAL PROTEIN: 7.7 g/dL (ref 6.5–8.1)
Total Bilirubin: 1.3 mg/dL — ABNORMAL HIGH (ref 0.3–1.2)

## 2016-10-11 LAB — PREPARE RBC (CROSSMATCH)

## 2016-10-11 LAB — CBC WITH DIFFERENTIAL/PLATELET
BASOS ABS: 0 10*3/uL (ref 0.0–0.1)
Basophils Relative: 0 %
Eosinophils Absolute: 0.2 10*3/uL (ref 0.0–0.7)
Eosinophils Relative: 3 %
HEMATOCRIT: 44 % (ref 39.0–52.0)
Hemoglobin: 15.2 g/dL (ref 13.0–17.0)
LYMPHS PCT: 18 %
Lymphs Abs: 1.1 10*3/uL (ref 0.7–4.0)
MCH: 29 pg (ref 26.0–34.0)
MCHC: 34.5 g/dL (ref 30.0–36.0)
MCV: 83.8 fL (ref 78.0–100.0)
MONO ABS: 0.6 10*3/uL (ref 0.1–1.0)
Monocytes Relative: 9 %
Neutro Abs: 4.5 10*3/uL (ref 1.7–7.7)
Neutrophils Relative %: 70 %
Platelets: 311 10*3/uL (ref 150–400)
RBC: 5.25 MIL/uL (ref 4.22–5.81)
RDW: 12.7 % (ref 11.5–15.5)
WBC: 6.4 10*3/uL (ref 4.0–10.5)

## 2016-10-11 LAB — SURGICAL PCR SCREEN
MRSA, PCR: NEGATIVE
STAPHYLOCOCCUS AUREUS: NEGATIVE

## 2016-10-11 MED ORDER — NEOMYCIN SULFATE 500 MG PO TABS: 1000.0000 mg | ORAL_TABLET | Freq: Three times a day (TID) | ORAL | 0 refills | Status: DC

## 2016-10-11 MED ORDER — METRONIDAZOLE 500 MG PO TABS: 500.0000 mg | ORAL_TABLET | Freq: Three times a day (TID) | ORAL | 0 refills | Status: DC

## 2016-10-11 NOTE — H&P (Signed)
Derek Rodriguez is an 56 y.o. male.   Chief Complaint: Right upper quadrant abdominal pain, recurrent colon carcinoma HPI: Patient is a 56 year old white male status post sigmoid colectomy in April 2017 who presented to the [facility] several weeks ago with right upper quadrant abdominal pain. He was found to have acute cholecystitis with cholelithiasis. In addition, he was noted on CAT scan to have a possible recurrence of the colon cancer in the abdomen. He had undergone chemotherapy, but had refused referral to reinforce [facility] for [facility] treatment. He underwent a colonoscopy on 10/09/2016 and was found to have a near obstructing mass at 20 cm from the anus. This was biopsy-proven to be adenocarcinoma. The patient now comes to the operating room for both a partial colectomy and cholecystectomy.  Past Medical History:  Diagnosis Date  . 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 Anxiety   . Arthritis   . Diverticulitis   . GERD (gastroesophageal reflux disease)   . Hypertension   . Neuropathy     Past Surgical History:  Procedure Laterality Date  . BIOPSY  02/06/2016   Procedure: BIOPSY;  Surgeon: Daneil Dolin, MD;  [facility]: [facility];  Service: Endoscopy;;  esophagus  . ESOPHAGEAL DILATION  02/06/2016   Procedure: ESOPHAGEAL DILATION;  Surgeon: Daneil Dolin, MD;  Location: [facility];  Service: Endoscopy;;  . ESOPHAGOGASTRODUODENOSCOPY N/A 02/06/2016   Procedure: ESOPHAGOGASTRODUODENOSCOPY (EGD);  Surgeon: Daneil Dolin, MD;  Location: [facility];  Service: Endoscopy;  Laterality: N/A;  115  . PARTIAL COLECTOMY N/A 10/03/2015   Procedure: PARTIAL COLECTOMY;  Surgeon: Aviva Signs, MD;  Location: [facility];  Service: General;  Laterality: N/A;  . PORTACATH PLACEMENT N/A 11/18/2015   Procedure: INSERTION PORT-A-CATH LEFT SUBCLAVIAN;  Surgeon: Aviva Signs, MD;  Location: [facility];  Service: General;  Laterality: N/A;    Family History  Problem Relation Age of  Onset  . Diabetes Father    Social History:  reports that he quit smoking about 31 years ago. His smoking use included Cigars and Pipe. He quit smokeless tobacco use about 15 months ago. His smokeless tobacco use included Snuff. He reports that he does not drink alcohol or use drugs.  Allergies: No Known Allergies  No prescriptions prior to admission.    Results for orders placed or performed during the [facility] encounter of 10/11/16 (from the past 48 hour(s))  CBC WITH DIFFERENTIAL     Status: None   Collection Time: 10/11/16 10:52 AM  Result Value Ref Range   WBC 6.4 4.0 - 10.5 K/uL   RBC 5.25 4.22 - 5.81 MIL/uL   Hemoglobin 15.2 13.0 - 17.0 g/dL   HCT 44.0 39.0 - 52.0 %   MCV 83.8 78.0 - 100.0 fL   MCH 29.0 26.0 - 34.0 pg   MCHC 34.5 30.0 - 36.0 g/dL   RDW 12.7 11.5 - 15.5 %   Platelets 311 150 - 400 K/uL   Neutrophils Relative % 70 %   Neutro Abs 4.5 1.7 - 7.7 K/uL   Lymphocytes Relative 18 %   Lymphs Abs 1.1 0.7 - 4.0 K/uL   Monocytes Relative 9 %   Monocytes Absolute 0.6 0.1 - 1.0 K/uL   Eosinophils Relative 3 %   Eosinophils Absolute 0.2 0.0 - 0.7 K/uL   Basophils Relative 0 %   Basophils Absolute 0.0 0.0 - 0.1 K/uL  Comprehensive metabolic panel     Status: Abnormal   Collection Time: 10/11/16  10:52 AM  Result Value Ref Range   Sodium 134 (L) 135 - 145 mmol/L   Potassium 3.7 3.5 - 5.1 mmol/L   Chloride 98 (L) 101 - 111 mmol/L   CO2 24 22 - 32 mmol/L   Glucose, Bld 69 65 - 99 mg/dL   BUN 15 6 - 20 mg/dL   Creatinine, Ser 1.21 0.61 - 1.24 mg/dL   Calcium 9.3 8.9 - 10.3 mg/dL   Total Protein 7.7 6.5 - 8.1 g/dL   Albumin 4.1 3.5 - 5.0 g/dL   AST 34 15 - 41 U/L   ALT 21 17 - 63 U/L   Alkaline Phosphatase 89 38 - 126 U/L   Total Bilirubin 1.3 (H) 0.3 - 1.2 mg/dL   GFR calc non Af Amer >60 >60 mL/min   GFR calc Af Amer >60 >60 mL/min    Comment: (NOTE) The eGFR has been calculated using the CKD EPI equation. This calculation has not been validated in all  clinical situations. eGFR's persistently <60 mL/min signify possible Chronic Kidney Disease.    Anion gap 12 5 - 15   No results found.  Review of Systems  Constitutional: Positive for malaise/fatigue.  HENT: Negative.   Eyes: Negative.   Respiratory: Negative.   Cardiovascular: Negative.   Gastrointestinal: Positive for abdominal pain.  Genitourinary: Negative.   Musculoskeletal: Negative.   Skin: Negative.   Neurological: Negative.   Endo/Heme/Allergies: Negative.   Psychiatric/Behavioral: Negative.     There were no vitals taken for this visit. Physical Exam  Vitals reviewed. Constitutional: He is oriented to person, place, and time. He appears well-developed and well-nourished.  HENT:  Head: Normocephalic and atraumatic.  Neck: Normal range of motion. Neck supple.  Cardiovascular: Normal rate, regular rhythm and normal heart sounds.   No murmur heard. Respiratory: Effort normal and breath sounds normal. He has no wheezes. He has no rales.  GI: Soft. He exhibits no distension. There is tenderness. There is no rebound and no guarding.  Mild tenderness to deep palpation in the right upper quadrant.  Neurological: He is alert and oriented to person, place, and time.  Skin: Skin is warm and dry.     Assessment/Plan Impression: Recurrent colon carcinoma, cholecystitis with cholelithiasis Plan: Patient scheduled for a partial colectomy, open cholecystectomy on 10/12/2016. The risks and benefits of both procedures including bleeding, infection, hepatobiliary injury, and the possibility of a colostomy were fully explained to the patient, who gave informed consent. GoLYTELY prep has already been performed. He will take neomycin and Flagyl preoperatively.  Aviva Signs, MD 10/11/2016, 1:14 PM

## 2016-10-11 NOTE — Progress Notes (Signed)
   10/11/16 1038  OBSTRUCTIVE SLEEP APNEA  Have you ever been diagnosed with sleep apnea through a sleep study? No  Do you snore loudly (loud enough to be heard through closed doors)?  1  Do you often feel tired, fatigued, or sleepy during the daytime (such as falling asleep during driving or talking to someone)? 1  Has anyone observed you stop breathing during your sleep? 1  Do you have, or are you being treated for high blood pressure? 1  BMI more than 35 kg/m2? 0  Age > 50 (1-yes) 1  Neck circumference greater than:Male 16 inches or larger, Male 17inches or larger? 0  Male Gender (Yes=1) 1  Obstructive Sleep Apnea Score 6  Score 5 or greater  Results sent to PCP

## 2016-10-11 NOTE — Progress Notes (Signed)
Subjective:     Derek [facility]  Here for follow-up and scheduling of surgery.  He had a colonoscopy 2 days ago which revealed recurrence of his colon carcinoma.  It is near obstructing. Objective:    BP 134/88   Pulse 79   Temp 97.8 F (36.6 C)   Resp 18   Ht 6' (1.829 m)   Wt 208 lb (94.3 kg)   BMI 28.21 kg/m   General:  alert, cooperative and no distress    Lungs clear to auscultation with equal breath sounds bilaterally.   Heart examination reveals a regular rate and rhythm without S3, S4, murmurs.   Abdomen is soft with slight tenderness to deep palpation in the right upper quadrant.  No hepatosplenomegaly, masses, hernias identified.    Final pathology from colonoscopy revealed a near obstructing adenocarcinoma at 20 cm from the anus.     Assessment:    Recurrent colon carcinoma, cholecystitis, cholelithiasis    Plan:     Patient is scheduled for a partial colectomy, cholecystectomy tomorrow.  The risks and benefits of the procedure including bleeding, infection, possibility of needing a colostomy, and the possibility of a blood transfusion were fully explained to the patient, who gave informed consent. Bryson and Flagyl have been prescribed preoperatively.  He has been on a clear liquid diet Tuesday.

## 2016-10-12 ENCOUNTER — Other Ambulatory Visit (HOSPITAL_COMMUNITY): Payer: BLUE CROSS/BLUE SHIELD

## 2016-10-12 ENCOUNTER — Ambulatory Visit (HOSPITAL_COMMUNITY): Payer: BLUE CROSS/BLUE SHIELD

## 2016-10-12 ENCOUNTER — Encounter (HOSPITAL_COMMUNITY): Payer: Self-pay | Admitting: *Deleted

## 2016-10-12 ENCOUNTER — Ambulatory Visit (HOSPITAL_COMMUNITY): Payer: BLUE CROSS/BLUE SHIELD | Admitting: Anesthesiology

## 2016-10-12 ENCOUNTER — Encounter (HOSPITAL_COMMUNITY)
Admission: AD | Disposition: A | Discharge status: Hospice | Payer: Self-pay | Source: Ambulatory Visit | Attending: General Surgery

## 2016-10-12 ENCOUNTER — Inpatient Hospital Stay (HOSPITAL_COMMUNITY)
Admission: AD | Admit: 2016-10-12 | Discharge: 2016-10-21 | DRG: 330 | Disposition: A | Discharge status: Hospice | Payer: BLUE CROSS/BLUE SHIELD | Source: Ambulatory Visit | Attending: General Surgery | Admitting: General Surgery

## 2016-10-12 DIAGNOSIS — R42 Dizziness and giddiness: Secondary | ICD-10-CM | POA: Diagnosis not present

## 2016-10-12 DIAGNOSIS — Z9221 Personal history of antineoplastic chemotherapy: Secondary | ICD-10-CM | POA: Diagnosis not present

## 2016-10-12 DIAGNOSIS — C189 Malignant neoplasm of colon, unspecified: Secondary | ICD-10-CM | POA: Diagnosis not present

## 2016-10-12 DIAGNOSIS — T402X5A Adverse effect of other opioids, initial encounter: Secondary | ICD-10-CM | POA: Diagnosis not present

## 2016-10-12 DIAGNOSIS — K219 Gastro-esophageal reflux disease without esophagitis: Secondary | ICD-10-CM | POA: Diagnosis present

## 2016-10-12 DIAGNOSIS — C187 Malignant neoplasm of sigmoid colon: Secondary | ICD-10-CM

## 2016-10-12 DIAGNOSIS — K8000 Calculus of gallbladder with acute cholecystitis without obstruction: Secondary | ICD-10-CM

## 2016-10-12 DIAGNOSIS — Z9049 Acquired absence of other specified parts of digestive tract: Secondary | ICD-10-CM | POA: Diagnosis not present

## 2016-10-12 DIAGNOSIS — Z87891 Personal history of nicotine dependence: Secondary | ICD-10-CM

## 2016-10-12 DIAGNOSIS — Z833 Family history of diabetes mellitus: Secondary | ICD-10-CM

## 2016-10-12 DIAGNOSIS — M62838 Other muscle spasm: Secondary | ICD-10-CM | POA: Diagnosis not present

## 2016-10-12 DIAGNOSIS — R1011 Right upper quadrant pain: Secondary | ICD-10-CM | POA: Diagnosis present

## 2016-10-12 DIAGNOSIS — C186 Malignant neoplasm of descending colon: Principal | ICD-10-CM | POA: Diagnosis present

## 2016-10-12 DIAGNOSIS — I10 Essential (primary) hypertension: Secondary | ICD-10-CM | POA: Diagnosis present

## 2016-10-12 DIAGNOSIS — C7889 Secondary malignant neoplasm of other digestive organs: Secondary | ICD-10-CM | POA: Diagnosis present

## 2016-10-12 HISTORY — PX: CHOLECYSTECTOMY: SHX55

## 2016-10-12 HISTORY — PX: COLECTOMY WITH COLOSTOMY CREATION/HARTMANN PROCEDURE: SHX6598

## 2016-10-12 LAB — CEA: CEA: 10.2 ng/mL — ABNORMAL HIGH (ref 0.0–4.7)

## 2016-10-12 SURGERY — COLECTOMY, WITH COLOSTOMY CREATION
Anesthesia: General | Site: Abdomen

## 2016-10-12 MED ORDER — ALVIMOPAN 12 MG PO CAPS
12.0000 mg | ORAL_CAPSULE | ORAL | Status: AC
  Administered 2016-10-12: 12 mg via ORAL

## 2016-10-12 MED ORDER — PHENYLEPHRINE HCL 10 MG/ML IJ SOLN
INTRAMUSCULAR | Status: DC | PRN
  Administered 2016-10-12 (×3): 40 ug via INTRAVENOUS

## 2016-10-12 MED ORDER — BUPIVACAINE LIPOSOME 1.3 % IJ SUSP
INTRAMUSCULAR | Status: DC | PRN
  Administered 2016-10-12: 20 mL

## 2016-10-12 MED ORDER — ALVIMOPAN 12 MG PO CAPS
ORAL_CAPSULE | ORAL | Status: AC
  Filled 2016-10-12: qty 1

## 2016-10-12 MED ORDER — ARTIFICIAL TEARS OPHTHALMIC OINT
TOPICAL_OINTMENT | OPHTHALMIC | Status: AC
  Filled 2016-10-12: qty 3.5

## 2016-10-12 MED ORDER — LACTATED RINGERS IV SOLN
INTRAVENOUS | Status: DC
  Administered 2016-10-12 (×4): via INTRAVENOUS

## 2016-10-12 MED ORDER — ONDANSETRON HCL 4 MG/2ML IJ SOLN
4.0000 mg | Freq: Four times a day (QID) | INTRAMUSCULAR | Status: DC | PRN
  Administered 2016-10-12 – 2016-10-15 (×8): 4 mg via INTRAVENOUS
  Filled 2016-10-12 (×9): qty 2

## 2016-10-12 MED ORDER — MIDAZOLAM HCL 2 MG/2ML IJ SOLN
1.0000 mg | INTRAMUSCULAR | Status: AC
  Administered 2016-10-12 (×2): 2 mg via INTRAVENOUS

## 2016-10-12 MED ORDER — SUFENTANIL CITRATE 50 MCG/ML IV SOLN
INTRAVENOUS | Status: AC
  Filled 2016-10-12: qty 1

## 2016-10-12 MED ORDER — ENOXAPARIN SODIUM 40 MG/0.4ML ~~LOC~~ SOLN
40.0000 mg | Freq: Once | SUBCUTANEOUS | Status: AC
  Administered 2016-10-12: 40 mg via SUBCUTANEOUS

## 2016-10-12 MED ORDER — LACTATED RINGERS IV SOLN
INTRAVENOUS | Status: DC
  Administered 2016-10-12 – 2016-10-15 (×7): via INTRAVENOUS

## 2016-10-12 MED ORDER — ROCURONIUM BROMIDE 50 MG/5ML IV SOLN
INTRAVENOUS | Status: AC
  Filled 2016-10-12: qty 1

## 2016-10-12 MED ORDER — ONDANSETRON HCL 4 MG/2ML IJ SOLN
4.0000 mg | Freq: Once | INTRAMUSCULAR | Status: AC
  Administered 2016-10-12: 4 mg via INTRAVENOUS

## 2016-10-12 MED ORDER — HYDROMORPHONE 1 MG/ML IV SOLN
INTRAVENOUS | Status: DC
  Administered 2016-10-12: 0.5 mg via INTRAVENOUS
  Filled 2016-10-12: qty 25

## 2016-10-12 MED ORDER — PROPOFOL 10 MG/ML IV BOLUS
INTRAVENOUS | Status: DC | PRN
  Administered 2016-10-12: 200 mg via INTRAVENOUS

## 2016-10-12 MED ORDER — CHLORHEXIDINE GLUCONATE CLOTH 2 % EX PADS: 6.0000 | MEDICATED_PAD | Freq: Once | CUTANEOUS | Status: DC

## 2016-10-12 MED ORDER — ACETAMINOPHEN 325 MG PO TABS: 650.0000 mg | ORAL_TABLET | Freq: Four times a day (QID) | ORAL | Status: DC | PRN

## 2016-10-12 MED ORDER — METRONIDAZOLE IN NACL 5-0.79 MG/ML-% IV SOLN
500.0000 mg | INTRAVENOUS | Status: AC
  Administered 2016-10-12: 500 mg via INTRAVENOUS

## 2016-10-12 MED ORDER — METRONIDAZOLE IN NACL 5-0.79 MG/ML-% IV SOLN
INTRAVENOUS | Status: AC
  Filled 2016-10-12: qty 100

## 2016-10-12 MED ORDER — GLYCOPYRROLATE 0.2 MG/ML IJ SOLN
INTRAMUSCULAR | Status: AC
  Filled 2016-10-12: qty 1

## 2016-10-12 MED ORDER — POVIDONE-IODINE 10 % EX OINT
TOPICAL_OINTMENT | CUTANEOUS | Status: AC
  Filled 2016-10-12: qty 2

## 2016-10-12 MED ORDER — CIPROFLOXACIN IN D5W 400 MG/200ML IV SOLN
INTRAVENOUS | Status: AC
  Filled 2016-10-12: qty 200

## 2016-10-12 MED ORDER — PHENYLEPHRINE 40 MCG/ML (10ML) SYRINGE FOR IV PUSH (FOR BLOOD PRESSURE SUPPORT)
PREFILLED_SYRINGE | INTRAVENOUS | Status: AC
  Filled 2016-10-12: qty 10

## 2016-10-12 MED ORDER — ONDANSETRON 4 MG PO TBDP: 4.0000 mg | ORAL_TABLET | Freq: Four times a day (QID) | ORAL | Status: DC | PRN

## 2016-10-12 MED ORDER — 0.9 % SODIUM CHLORIDE (POUR BTL) OPTIME
TOPICAL | Status: DC | PRN
  Administered 2016-10-12: 2000 mL
  Administered 2016-10-12: 1000 mL

## 2016-10-12 MED ORDER — MIDAZOLAM HCL 2 MG/2ML IJ SOLN
INTRAMUSCULAR | Status: DC | PRN
  Administered 2016-10-12: 2 mg via INTRAVENOUS

## 2016-10-12 MED ORDER — MIDAZOLAM HCL 2 MG/2ML IJ SOLN
INTRAMUSCULAR | Status: AC
  Filled 2016-10-12: qty 2

## 2016-10-12 MED ORDER — KETOROLAC TROMETHAMINE 30 MG/ML IJ SOLN
30.0000 mg | Freq: Once | INTRAMUSCULAR | Status: AC
  Administered 2016-10-12: 30 mg via INTRAVENOUS

## 2016-10-12 MED ORDER — ACETAMINOPHEN 650 MG RE SUPP: 650.0000 mg | Freq: Four times a day (QID) | RECTAL | Status: DC | PRN

## 2016-10-12 MED ORDER — FENTANYL CITRATE (PF) 250 MCG/5ML IJ SOLN
INTRAMUSCULAR | Status: AC
  Filled 2016-10-12: qty 5

## 2016-10-12 MED ORDER — FENTANYL CITRATE (PF) 100 MCG/2ML IJ SOLN
INTRAMUSCULAR | Status: AC
  Filled 2016-10-12: qty 2

## 2016-10-12 MED ORDER — ENOXAPARIN SODIUM 40 MG/0.4ML ~~LOC~~ SOLN
SUBCUTANEOUS | Status: AC
  Filled 2016-10-12: qty 0.4

## 2016-10-12 MED ORDER — FENTANYL CITRATE (PF) 100 MCG/2ML IJ SOLN
100.0000 ug | INTRAMUSCULAR | Status: DC | PRN
Start: 2016-10-12 — End: 2016-10-13
  Administered 2016-10-12 – 2016-10-13 (×6): 100 ug via INTRAVENOUS
  Filled 2016-10-12 (×6): qty 2

## 2016-10-12 MED ORDER — BUPIVACAINE LIPOSOME 1.3 % IJ SUSP
INTRAMUSCULAR | Status: AC
  Filled 2016-10-12: qty 20

## 2016-10-12 MED ORDER — NALOXONE HCL 0.4 MG/ML IJ SOLN: 0.4000 mg | INTRAMUSCULAR | Status: DC | PRN

## 2016-10-12 MED ORDER — OXYCODONE-ACETAMINOPHEN 5-325 MG PO TABS
1.0000 | ORAL_TABLET | ORAL | Status: DC | PRN
  Administered 2016-10-15 – 2016-10-19 (×11): 2 via ORAL
  Filled 2016-10-12 (×11): qty 2

## 2016-10-12 MED ORDER — DIPHENHYDRAMINE HCL 50 MG/ML IJ SOLN
25.0000 mg | Freq: Four times a day (QID) | INTRAMUSCULAR | Status: DC | PRN
Start: 2016-10-12 — End: 2016-10-21

## 2016-10-12 MED ORDER — SODIUM CHLORIDE 0.9% FLUSH: 9.0000 mL | INTRAVENOUS | Status: DC | PRN

## 2016-10-12 MED ORDER — POVIDONE-IODINE 10 % OINT PACKET
TOPICAL_OINTMENT | CUTANEOUS | Status: DC | PRN
Start: 2016-10-12 — End: 2016-10-12
  Administered 2016-10-12: 2 via TOPICAL

## 2016-10-12 MED ORDER — SUFENTANIL CITRATE 50 MCG/ML IV SOLN
INTRAVENOUS | Status: DC | PRN
  Administered 2016-10-12 (×3): 10 ug via INTRAVENOUS
  Administered 2016-10-12 (×4): 5 ug via INTRAVENOUS

## 2016-10-12 MED ORDER — ONDANSETRON HCL 4 MG/2ML IJ SOLN: 4.0000 mg | Freq: Four times a day (QID) | INTRAMUSCULAR | Status: DC | PRN

## 2016-10-12 MED ORDER — SUCCINYLCHOLINE CHLORIDE 20 MG/ML IJ SOLN
INTRAMUSCULAR | Status: DC | PRN
  Administered 2016-10-12: 120 mg via INTRAVENOUS

## 2016-10-12 MED ORDER — NEOSTIGMINE METHYLSULFATE 10 MG/10ML IV SOLN
INTRAVENOUS | Status: AC
  Filled 2016-10-12: qty 1

## 2016-10-12 MED ORDER — HYDROMORPHONE HCL 1 MG/ML IJ SOLN
INTRAMUSCULAR | Status: AC
Start: 2016-10-12 — End: 2016-10-12
  Filled 2016-10-12: qty 1

## 2016-10-12 MED ORDER — ZOLPIDEM TARTRATE 5 MG PO TABS
10.0000 mg | ORAL_TABLET | Freq: Every evening | ORAL | Status: DC | PRN
  Administered 2016-10-16: 10 mg via ORAL
  Filled 2016-10-12: qty 2

## 2016-10-12 MED ORDER — KETOROLAC TROMETHAMINE 30 MG/ML IJ SOLN
INTRAMUSCULAR | Status: AC
  Filled 2016-10-12: qty 1

## 2016-10-12 MED ORDER — MIDAZOLAM HCL 2 MG/2ML IJ SOLN
2.0000 mg | Freq: Once | INTRAMUSCULAR | Status: AC
  Administered 2016-10-12: 2 mg via INTRAVENOUS

## 2016-10-12 MED ORDER — FENTANYL CITRATE (PF) 100 MCG/2ML IJ SOLN
INTRAMUSCULAR | Status: DC | PRN
  Administered 2016-10-12 (×4): 25 ug via INTRAVENOUS

## 2016-10-12 MED ORDER — SIMETHICONE 80 MG PO CHEW
40.0000 mg | CHEWABLE_TABLET | Freq: Four times a day (QID) | ORAL | Status: DC | PRN
  Administered 2016-10-13 (×2): 40 mg via ORAL
  Filled 2016-10-12 (×2): qty 1

## 2016-10-12 MED ORDER — LIDOCAINE HCL (CARDIAC) 10 MG/ML IV SOLN
INTRAVENOUS | Status: DC | PRN
  Administered 2016-10-12: 50 mg via INTRAVENOUS

## 2016-10-12 MED ORDER — GLYCOPYRROLATE 0.2 MG/ML IJ SOLN
INTRAMUSCULAR | Status: DC | PRN
  Administered 2016-10-12: 0.6 mg via INTRAVENOUS

## 2016-10-12 MED ORDER — SODIUM CHLORIDE 0.9 % IJ SOLN
INTRAMUSCULAR | Status: AC
  Filled 2016-10-12: qty 10

## 2016-10-12 MED ORDER — NEOSTIGMINE METHYLSULFATE 10 MG/10ML IV SOLN
INTRAVENOUS | Status: DC | PRN
  Administered 2016-10-12: 3 mg via INTRAVENOUS

## 2016-10-12 MED ORDER — LORAZEPAM 2 MG/ML IJ SOLN
1.0000 mg | INTRAMUSCULAR | Status: DC | PRN
  Administered 2016-10-13 – 2016-10-14 (×2): 1 mg via INTRAVENOUS
  Filled 2016-10-12 (×2): qty 1

## 2016-10-12 MED ORDER — ONDANSETRON HCL 4 MG/2ML IJ SOLN
INTRAMUSCULAR | Status: AC
  Filled 2016-10-12: qty 2

## 2016-10-12 MED ORDER — DIPHENHYDRAMINE HCL 12.5 MG/5ML PO ELIX: 12.5000 mg | ORAL_SOLUTION | Freq: Four times a day (QID) | ORAL | Status: DC | PRN

## 2016-10-12 MED ORDER — PROPOFOL 10 MG/ML IV BOLUS
INTRAVENOUS | Status: AC
  Filled 2016-10-12: qty 20

## 2016-10-12 MED ORDER — DIPHENHYDRAMINE HCL 25 MG PO CAPS: 25.0000 mg | ORAL_CAPSULE | Freq: Four times a day (QID) | ORAL | Status: DC | PRN

## 2016-10-12 MED ORDER — DIPHENHYDRAMINE HCL 50 MG/ML IJ SOLN: 12.5000 mg | Freq: Four times a day (QID) | INTRAMUSCULAR | Status: DC | PRN

## 2016-10-12 MED ORDER — CIPROFLOXACIN IN D5W 400 MG/200ML IV SOLN
400.0000 mg | INTRAVENOUS | Status: AC
  Administered 2016-10-12: 400 mg via INTRAVENOUS

## 2016-10-12 MED ORDER — ALVIMOPAN 12 MG PO CAPS
12.0000 mg | ORAL_CAPSULE | Freq: Two times a day (BID) | ORAL | Status: DC
  Administered 2016-10-13 – 2016-10-15 (×5): 12 mg via ORAL
  Filled 2016-10-12 (×7): qty 1

## 2016-10-12 MED ORDER — HYDROMORPHONE HCL 1 MG/ML IJ SOLN
0.2500 mg | INTRAMUSCULAR | Status: AC | PRN
  Administered 2016-10-12 (×8): 0.5 mg via INTRAVENOUS
  Filled 2016-10-12 (×3): qty 1

## 2016-10-12 MED ORDER — BUPIVACAINE HCL (PF) 0.5 % IJ SOLN
INTRAMUSCULAR | Status: AC
  Filled 2016-10-12: qty 30

## 2016-10-12 MED ORDER — HEMOSTATIC AGENTS (NO CHARGE) OPTIME
TOPICAL | Status: DC | PRN
  Administered 2016-10-12: 1 via TOPICAL

## 2016-10-12 MED ORDER — ENOXAPARIN SODIUM 40 MG/0.4ML ~~LOC~~ SOLN
40.0000 mg | SUBCUTANEOUS | Status: DC
  Administered 2016-10-15: 40 mg via SUBCUTANEOUS
  Filled 2016-10-12 (×5): qty 0.4

## 2016-10-12 MED ORDER — BUPIVACAINE HCL (PF) 0.5 % IJ SOLN: INTRAMUSCULAR | Status: DC | PRN

## 2016-10-12 MED ORDER — ROCURONIUM BROMIDE 100 MG/10ML IV SOLN
INTRAVENOUS | Status: DC | PRN
  Administered 2016-10-12: 10 mg via INTRAVENOUS
  Administered 2016-10-12: 15 mg via INTRAVENOUS
  Administered 2016-10-12: 10 mg via INTRAVENOUS
  Administered 2016-10-12: 5 mg via INTRAVENOUS
  Administered 2016-10-12: 10 mg via INTRAVENOUS
  Administered 2016-10-12: 35 mg via INTRAVENOUS
  Administered 2016-10-12: 15 mg via INTRAVENOUS

## 2016-10-12 SURGICAL SUPPLY — 90 items
APPLIER CLIP 11 MED OPEN (CLIP)
APPLIER CLIP 13 LRG OPEN (CLIP) ×4
BAG HAMPER (MISCELLANEOUS) ×4 IMPLANT
BARRIER SKIN 2 3/4 (OSTOMY) ×3 IMPLANT
BARRIER SKIN 2 3/4 INCH (OSTOMY) ×1
CELLS DAT CNTRL 66122 CELL SVR (MISCELLANEOUS) ×2 IMPLANT
CHLORAPREP W/TINT 26ML (MISCELLANEOUS) ×4 IMPLANT
CHOLANGIOGRAM CATH TAUT (CATHETERS) IMPLANT
CLAMP POUCH DRAINAGE QUIET (OSTOMY) IMPLANT
CLEANER TIP ELECTROSURG 2X2 (MISCELLANEOUS) ×4 IMPLANT
CLIP APPLIE 11 MED OPEN (CLIP) IMPLANT
CLIP APPLIE 13 LRG OPEN (CLIP) ×2 IMPLANT
CLOTH BEACON ORANGE TIMEOUT ST (SAFETY) ×4 IMPLANT
COVER LIGHT HANDLE STERIS (MISCELLANEOUS) ×8 IMPLANT
COVER MAYO STAND XLG (DRAPE) IMPLANT
DECANTER SPIKE VIAL GLASS SM (MISCELLANEOUS) IMPLANT
DRAPE C-ARM FOLDED MOBILE STRL (DRAPES) IMPLANT
DRAPE UTILITY W/TAPE 26X15 (DRAPES) ×8 IMPLANT
DRAPE WARM FLUID 44X44 (DRAPE) ×4 IMPLANT
DRSG OPSITE POSTOP 4X10 (GAUZE/BANDAGES/DRESSINGS) ×4 IMPLANT
DRSG OPSITE POSTOP 4X8 (GAUZE/BANDAGES/DRESSINGS) ×4 IMPLANT
ELECT BLADE 6 FLAT ULTRCLN (ELECTRODE) IMPLANT
ELECT REM PT RETURN 9FT ADLT (ELECTROSURGICAL) ×4
ELECTRODE REM PT RTRN 9FT ADLT (ELECTROSURGICAL) ×2 IMPLANT
EVACUATOR DRAINAGE 10X20 100CC (DRAIN) IMPLANT
EVACUATOR SILICONE 100CC (DRAIN)
FORMALIN 10 PREFIL 480ML (MISCELLANEOUS) IMPLANT
GAUZE SPONGE 4X4 12PLY STRL (GAUZE/BANDAGES/DRESSINGS) ×4 IMPLANT
GLOVE BIOGEL PI IND STRL 7.0 (GLOVE) ×8 IMPLANT
GLOVE BIOGEL PI INDICATOR 7.0 (GLOVE) ×8
GLOVE SURG SS PI 7.5 STRL IVOR (GLOVE) ×8 IMPLANT
GOWN STRL REUS W/ TWL XL LVL3 (GOWN DISPOSABLE) ×4 IMPLANT
GOWN STRL REUS W/TWL LRG LVL3 (GOWN DISPOSABLE) ×16 IMPLANT
GOWN STRL REUS W/TWL XL LVL3 (GOWN DISPOSABLE) ×4
HANDLE SUCTION POOLE (INSTRUMENTS) ×2 IMPLANT
HEMOSTAT SNOW SURGICEL 2X4 (HEMOSTASIS) ×4 IMPLANT
INST SET MAJOR GENERAL (KITS) ×4 IMPLANT
KIT BLADEGUARD II DBL (SET/KITS/TRAYS/PACK) ×4 IMPLANT
KIT ROOM TURNOVER APOR (KITS) ×4 IMPLANT
LIGASURE IMPACT 36 18CM CVD LR (INSTRUMENTS) ×4 IMPLANT
MANIFOLD NEPTUNE II (INSTRUMENTS) ×4 IMPLANT
NEEDLE HYPO 18GX1.5 BLUNT FILL (NEEDLE) ×4 IMPLANT
NEEDLE HYPO 21X1.5 SAFETY (NEEDLE) ×4 IMPLANT
NEEDLE HYPO 25X1 1.5 SAFETY (NEEDLE) ×4 IMPLANT
NS IRRIG 1000ML POUR BTL (IV SOLUTION) ×8 IMPLANT
PACK ABDOMINAL MAJOR (CUSTOM PROCEDURE TRAY) ×4 IMPLANT
PAD ARMBOARD 7.5X6 YLW CONV (MISCELLANEOUS) ×4 IMPLANT
PENCIL HANDSWITCHING (ELECTRODE) ×4 IMPLANT
POUCH OSTOMY 2 3/4  H 3804 (WOUND CARE)
POUCH OSTOMY 2 PC DRNBL 2.25 (WOUND CARE) ×2 IMPLANT
POUCH OSTOMY 2 PC DRNBL 2.75 (WOUND CARE) IMPLANT
POUCH OSTOMY DRNBL 2 1/4 (WOUND CARE) ×2
RELOAD LINEAR CUT PROX 55 BLUE (ENDOMECHANICALS) ×4 IMPLANT
RELOAD PROXIMATE 75MM BLUE (ENDOMECHANICALS) IMPLANT
RETAINER VISCERA MED (MISCELLANEOUS) ×4 IMPLANT
RETRACTOR WND ALEXIS 25 LRG (MISCELLANEOUS) IMPLANT
RTRCTR WOUND ALEXIS 18CM MED (MISCELLANEOUS) ×4
RTRCTR WOUND ALEXIS 25CM LRG (MISCELLANEOUS)
SET BASIN LINEN APH (SET/KITS/TRAYS/PACK) ×4 IMPLANT
SPONGE DRAIN TRACH 4X4 STRL 2S (GAUZE/BANDAGES/DRESSINGS) ×4 IMPLANT
SPONGE INTESTINAL PEANUT (DISPOSABLE) ×4 IMPLANT
SPONGE LAP 18X18 X RAY DECT (DISPOSABLE) ×8 IMPLANT
SPONGE LAP 4X18 X RAY DECT (DISPOSABLE) ×4 IMPLANT
SPONGE SURGIFOAM ABS GEL 100 (HEMOSTASIS) ×4 IMPLANT
STAPLER GUN LINEAR PROX 60 (STAPLE) ×4 IMPLANT
STAPLER PROXIMATE 55 BLUE (STAPLE) ×4 IMPLANT
STAPLER PROXIMATE 75MM BLUE (STAPLE) ×4 IMPLANT
STAPLER VISISTAT (STAPLE) ×8 IMPLANT
SUCTION POOLE HANDLE (INSTRUMENTS) ×4
SUCTION YANKAUER HANDLE (MISCELLANEOUS) ×4 IMPLANT
SUT CHROMIC 0 SH (SUTURE) IMPLANT
SUT CHROMIC 2 0 SH (SUTURE) IMPLANT
SUT CHROMIC 3 0 SH 27 (SUTURE) ×12 IMPLANT
SUT ETHILON 3 0 FSL (SUTURE) IMPLANT
SUT NOVA NAB GS-26 0 60 (SUTURE) ×8 IMPLANT
SUT PDS AB 0 CTX 60 (SUTURE) ×8 IMPLANT
SUT SILK 2 0 (SUTURE) ×2
SUT SILK 2 0 REEL (SUTURE) ×4 IMPLANT
SUT SILK 2-0 18XBRD TIE 12 (SUTURE) ×2 IMPLANT
SUT SILK 3 0 SH CR/8 (SUTURE) ×4 IMPLANT
SUT VIC AB 0 CT1 27 (SUTURE)
SUT VIC AB 0 CT1 27XBRD ANTBC (SUTURE) IMPLANT
SUT VIC AB 0 CT1 27XCR 8 STRN (SUTURE) IMPLANT
SYR 20CC LL (SYRINGE) ×4 IMPLANT
SYR 30ML LL (SYRINGE) IMPLANT
SYR CONTROL 10ML LL (SYRINGE) ×4 IMPLANT
SYRINGE 10CC LL (SYRINGE) ×4 IMPLANT
TOWEL BLUE STERILE X RAY DET (MISCELLANEOUS) ×4 IMPLANT
TOWEL OR 17X26 4PK STRL BLUE (TOWEL DISPOSABLE) ×4 IMPLANT
TRAY FOLEY CATH SILVER 16FR (SET/KITS/TRAYS/PACK) ×4 IMPLANT

## 2016-10-12 NOTE — Anesthesia Preprocedure Evaluation (Signed)
Anesthesia Evaluation  Patient identified by MRN, date of birth, ID band Patient awake    Reviewed: Allergy & Precautions, NPO status , Patient's Chart, lab work & pertinent test results  Airway Mallampati: II  TM Distance: >3 FB     Dental  (+) Teeth Intact, Dental Advisory Given   Pulmonary former smoker,    Pulmonary exam normal        Cardiovascular hypertension, Pt. on medications  Rhythm:Regular Rate:Normal     Neuro/Psych PSYCHIATRIC DISORDERS Anxiety Depression    GI/Hepatic negative GI ROS, GERD  ,  Endo/Other    Renal/GU      Musculoskeletal   Abdominal   Peds  Hematology   Anesthesia Other Findings   Reproductive/Obstetrics                             Anesthesia Physical Anesthesia Plan  ASA: II  Anesthesia Plan: General   Post-op Pain Management:    Induction: Intravenous  Airway Management Planned: Oral ETT  Additional Equipment:   Intra-op Plan:   Post-operative Plan: Extubation in OR  Informed Consent: I have reviewed the patients History and Physical, chart, labs and discussed the procedure including the risks, benefits and alternatives for the proposed anesthesia with the patient or authorized representative who has indicated his/her understanding and acceptance.     Plan Discussed with:   Anesthesia Plan Comments:         Anesthesia Quick Evaluation

## 2016-10-12 NOTE — Transfer of Care (Signed)
Immediate Anesthesia Transfer of Care Note  Patient: Derek Rodriguez  Procedure(s) Performed: Procedure(s): CHOLECYSTECTOMY (N/A) COLECTOMY WITH COLOSTOMY CREATION/HARTMANN PROCEDURE (PALIATIVE) PARTIAL COLECTOMY WITH OSTOMY CREATION SMALL BOWEL BYPASS (N/A)  Patient Location: PACU  Anesthesia Type:General  Level of Consciousness: awake and patient cooperative  Airway & Oxygen Therapy: Patient Spontanous Breathing and non-rebreather face mask  Post-op Assessment: Report given to RN and Post -op Vital signs reviewed and stable  Post vital signs: Reviewed and stable  Last Vitals:  Vitals:   10/12/16 0850 10/12/16 0855  BP: 121/83 119/82  Resp: 13 16  Temp:      Last Pain:  Vitals:   10/12/16 0747  TempSrc: Oral         Complications: No apparent anesthesia complications

## 2016-10-12 NOTE — Anesthesia Postprocedure Evaluation (Addendum)
Anesthesia Post Note  Patient: Derek Rodriguez  Procedure(s) Performed: Procedure(s) (LRB): CHOLECYSTECTOMY (N/A) COLECTOMY WITH COLOSTOMY CREATION/HARTMANN PROCEDURE (PALIATIVE) PARTIAL COLECTOMY WITH END OSTOMY CREATION SMALL BOWEL BYPASS (N/A)  Patient location during evaluation: PACU Anesthesia Type: General Level of consciousness: awake Pain management: pain level not controlled (Patient sleeps between pain medicine doses  . Dr. Arnoldo Morale aware and PCA orders received.) Vital Signs Assessment: post-procedure vital signs reviewed and stable Respiratory status: spontaneous breathing and patient connected to nasal cannula oxygen Cardiovascular status: stable Anesthetic complications: no     Last Vitals:  Vitals:   10/12/16 1300 10/12/16 1312  BP: 119/81   Pulse: 93 80  Resp: 11 10  Temp:      Last Pain:  Vitals:   10/12/16 1312  TempSrc:   PainSc: 10-Worst pain ever                 Meilani Edmundson

## 2016-10-12 NOTE — Anesthesia Procedure Notes (Signed)
Procedure Name: Intubation Date/Time: 10/12/2016 9:10 AM Performed by: Vista Deck Pre-anesthesia Checklist: Patient identified, Patient being monitored, Timeout performed, Emergency Drugs available and Suction available Patient Re-evaluated:Patient Re-evaluated prior to inductionOxygen Delivery Method: Circle System Utilized Preoxygenation: Pre-oxygenation with 100% oxygen Intubation Type: IV induction, Rapid sequence and Cricoid Pressure applied Laryngoscope Size: Miller and 2 Grade View: Grade II Tube type: Oral Tube size: 8.0 mm Number of attempts: 1 Airway Equipment and Method: Stylet and Oral airway Placement Confirmation: ETT inserted through vocal cords under direct vision,  positive ETCO2 and breath sounds checked- equal and bilateral Secured at: 22 cm Tube secured with: Tape Dental Injury: Teeth and Oropharynx as per pre-operative assessment

## 2016-10-12 NOTE — Interval H&P Note (Signed)
History and Physical Interval Note:  10/12/2016 8:05 AM  Derek Rodriguez  has presented today for surgery, with the diagnosis of recurrent colon cancer, cholecystitis  The various methods of treatment have been discussed with the patient and family. After consideration of risks, benefits and other options for treatment, the patient has consented to  Procedure(s): PARTIAL COLECTOMY (N/A) CHOLECYSTECTOMY (N/A) as a surgical intervention .  The patient's history has been reviewed, patient examined, no change in status, stable for surgery.  I have reviewed the patient's chart and labs.  Questions were answered to the patient's satisfaction.     Aviva Signs

## 2016-10-12 NOTE — Op Note (Signed)
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   Anes:  General endotracheal   Indications:  Patient is a 56 year old white male status post sigmoid colectomy in April 2017 who underwent chemotherapy postoperatively. Attempts were made to refer him for [facility] treatment, but patient refused. Patient had a difficult time tolerating chemotherapy. He presented 1 month ago with right upper quadrant abdominal pain. He was found to have acute cholecystitis with cholelithiasis. During his workup, he was found in CT scan of the abdomen to have a recurrent mass in the left lower quadrant. In addition, his CEA level was noted to be elevated at 7. He underwent a colonoscopy 2 days ago and was found to have a near obstructing colonic mass. Biopsy revealed recurrent adenocarcinoma. The patient comes to the operating room for partial colectomy with possible colostomy, and cholecystectomy. The risks and benefits of the procedures including bleeding, infection, cardiopulmonary difficulties, the possibility of a blood transfusion, and the possibility of a colostomy were fully explained to the patient, who gave informed consent.   Procedure note:  The patient was placed the supine position. After induction of general endotracheal anesthesia, the abdomen was prepped and draped using the usual sterile technique with DuraPrep. The perineum was prepped and draped using the usual sterile technique with Betadine. Surgical site confirmation was performed.   A  midline incision was made from the xiphoid process to the suprapubic region. The peritoneal cavity was entered into without difficulty. I initially turned my attention to the cholecystectomy. The gallbladder wall was noted to be thickened and distended. The gallbladder was dissected free from the liver in a retrograde fashion down to the cystic artery and cystic duct. The cystic artery was ligated and divided using clips. The cystic duct was noted to be very thick walled. 2-0 silk ties 3 were placed on the cystic duct. The gallbladder was then amputated above the ties. It was sent to pathology for further examination. A bleeding from the gallbladder bed was controlled using Bovie electrocautery. Surgicel was then placed in the gallbladder bed.   Next, turned my attention to the colonic mass. At the level of the previously resected sigmoid colon, a large hard mass which was fixed in nature was noted involving the sigmoid colon region as well as several loops of distal small bowel. I could not free away the colon or small bowel from this mass. My concern that this mass is extending down to possibly include the left ureter. Vascular structures may also be involved at the level of the bifurcation of the iliac artery. I could not pain or point distal to the mass towards the rectum. 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 The descending colon was mobilized along its peritoneal reflection. The splenic flexure was taken down in order to mobilize the descending colon. The LigaSure was used to help divide the mesentery. A GIA stapler was placed across the distal descending colon and fired. An ostomy was then formed to the left of the umbilicus. The muscle was divided in a  cruciate like fashion. The descending colon was then brought up through this ostomy site. In addition, the small bowel was then inspected from the ligament of Treitz to the terminal  ileum. The proximal small bowel was noted to be dilated with a thickened wall. Distal to this, multiple loops of small bowel entered the mass, with the exiting small bowel collapsed in close proximity to the terminal ileum. As this was not resectable, a side to side enteroenterostomy was performed using a GIA 55 stapler to bypass the obstructing area. The enterotomy was closed using a TA 60 stapler. The staple line was bolstered using 3-0 silk sutures. A patent anastomosis was found.  The abdominal cavity was copiously irrigated with normal saline. All operating personnel been changed her gown and gloves. A new setup was used. The fascia was reapproximated using a looped 0 Novafil running suture. The subcutaneous layer was irrigated with normal saline. Exparel was instilled into the surrounding wound. The skin was closed using staples. Betadine ointment and a dry sterile dressing were applied. The ostomy was then matured using 3-0 chromic gut interrupted sutures. An ostomy appliance was then applied.  All tape and needle counts were correct at the end of the procedure. The patient was extubated in the operating room and transferred to PACU in stable condition.   Complications:  None   EBL:  400 mL   Specimen:  Descending colon, gallbladder

## 2016-10-12 NOTE — Progress Notes (Signed)
Patient still too drowsy from anesthesia RT will instruct IS in the AM.

## 2016-10-13 LAB — CBC
HCT: 38 % — ABNORMAL LOW (ref 39.0–52.0)
Hemoglobin: 13.4 g/dL (ref 13.0–17.0)
MCH: 29.5 pg (ref 26.0–34.0)
MCHC: 35.3 g/dL (ref 30.0–36.0)
MCV: 83.7 fL (ref 78.0–100.0)
PLATELETS: 316 10*3/uL (ref 150–400)
RBC: 4.54 MIL/uL (ref 4.22–5.81)
RDW: 13.1 % (ref 11.5–15.5)
WBC: 9.2 10*3/uL (ref 4.0–10.5)

## 2016-10-13 LAB — BASIC METABOLIC PANEL
ANION GAP: 10 (ref 5–15)
BUN: 22 mg/dL — ABNORMAL HIGH (ref 6–20)
CHLORIDE: 99 mmol/L — AB (ref 101–111)
CO2: 23 mmol/L (ref 22–32)
Calcium: 8.4 mg/dL — ABNORMAL LOW (ref 8.9–10.3)
Creatinine, Ser: 1.08 mg/dL (ref 0.61–1.24)
GFR calc non Af Amer: >60 mL/min (ref >60)
Glucose, Bld: 85 mg/dL (ref 65–99)
POTASSIUM: 4.2 mmol/L (ref 3.5–5.1)
SODIUM: 132 mmol/L — AB (ref 135–145)

## 2016-10-13 LAB — HEPATIC FUNCTION PANEL
ALT: 31 U/L (ref 17–63)
AST: 53 U/L — AB (ref 15–41)
Albumin: 3.1 g/dL — ABNORMAL LOW (ref 3.5–5.0)
Alkaline Phosphatase: 57 U/L (ref 38–126)
BILIRUBIN DIRECT: 0.3 mg/dL (ref 0.1–0.5)
Indirect Bilirubin: 1.1 mg/dL — ABNORMAL HIGH (ref 0.3–0.9)
Total Bilirubin: 1.4 mg/dL — ABNORMAL HIGH (ref 0.3–1.2)
Total Protein: 5.6 g/dL — ABNORMAL LOW (ref 6.5–8.1)

## 2016-10-13 LAB — MAGNESIUM: MAGNESIUM: 1.4 mg/dL — AB (ref 1.7–2.4)

## 2016-10-13 LAB — PHOSPHORUS: PHOSPHORUS: 3.2 mg/dL (ref 2.5–4.6)

## 2016-10-13 MED ORDER — MAGNESIUM SULFATE 2 GM/50ML IV SOLN
2.0000 g | Freq: Once | INTRAVENOUS | Status: AC
Start: 2016-10-13 — End: 2016-10-13
  Administered 2016-10-13: 2 g via INTRAVENOUS
  Filled 2016-10-13: qty 50

## 2016-10-13 MED ORDER — FENTANYL 75 MCG/HR TD PT72
75.0000 ug | MEDICATED_PATCH | TRANSDERMAL | Status: DC
  Administered 2016-10-13 – 2016-10-19 (×3): 75 ug via TRANSDERMAL
  Filled 2016-10-13 (×3): qty 1

## 2016-10-13 MED ORDER — PRO-STAT SUGAR FREE PO LIQD
30.0000 mL | Freq: Two times a day (BID) | ORAL | Status: DC
  Administered 2016-10-13 – 2016-10-15 (×3): 30 mL via ORAL
  Filled 2016-10-13 (×8): qty 30

## 2016-10-13 MED ORDER — BOOST / RESOURCE BREEZE PO LIQD
1.0000 | Freq: Two times a day (BID) | ORAL | Status: DC
  Administered 2016-10-13: 1 via ORAL

## 2016-10-13 MED ORDER — FENTANYL CITRATE (PF) 100 MCG/2ML IJ SOLN
50.0000 ug | INTRAMUSCULAR | Status: DC | PRN
  Administered 2016-10-13 – 2016-10-18 (×32): 50 ug via INTRAVENOUS
  Filled 2016-10-13 (×33): qty 2

## 2016-10-13 MED ORDER — ENSURE ENLIVE PO LIQD
237.0000 mL | Freq: Two times a day (BID) | ORAL | Status: DC
  Administered 2016-10-15 – 2016-10-16 (×3): 237 mL via ORAL

## 2016-10-13 NOTE — Addendum Note (Signed)
Addendum  created 10/13/16 1539 by Mickel Baas, CRNA   Sign clinical note

## 2016-10-13 NOTE — Progress Notes (Signed)
1 Day Post-Op  Subjective: Patient complains of severe incisional pain. He is drowsy and then a suddenly awakened with intense pain. PCA was attempted, but kept him awake.  Objective: Vital signs in last 24 hours: Temp:  [97.6 F (36.4 C)-98.6 F (37 C)] 98.6 F (37 C) (04/28 0516) Pulse Rate:  [80-98] 98 (04/27 1900) Resp:  [6-20] 20 (04/28 0516) BP: (101-150)/(70-95) 109/77 (04/28 0516) SpO2:  [92 %-100 %] 97 % (04/28 0516) Weight:  [222 lb 3.2 oz (100.8 kg)] 222 lb 3.2 oz (100.8 kg) (04/27 1423) Last BM Date: 10/12/16  Intake/Output from previous day: 04/27 0701 - 04/28 0700 In: 4912.2 [P.O.:40; I.V.:4872.2] Out: 1250 [Urine:650; Stool:200; Blood:400] Intake/Output this shift: No intake/output data recorded.  General appearance: Cooperative but complains of pain intermittently when asked. Resp: clear to auscultation bilaterally Cardio: regular rate and rhythm, S1, S2 normal, no murmur, click, rub or gallop GI: Incision healing well. Ostomy pink. No ostomy output yet.  Lab Results:   Recent Labs  10/11/16 1052 10/13/16 0611  WBC 6.4 9.2  HGB 15.2 13.4  HCT 44.0 38.0*  PLT 311 316   BMET  Recent Labs  10/11/16 1052 10/13/16 0611  NA 134* 132*  K 3.7 4.2  CL 98* 99*  CO2 24 23  GLUCOSE 69 85  BUN 15 22*  CREATININE 1.21 1.08  CALCIUM 9.3 8.4*   PT/INR No results for input(s): LABPROT, INR in the last 72 hours.  Studies/Results: No results found.  Anti-infectives: Anti-infectives    Start     Dose/Rate Route Frequency Ordered Stop   10/12/16 0803  metroNIDAZOLE (FLAGYL) IVPB 500 mg     500 mg 100 mL/hr over 60 Minutes Intravenous On call to O.R. 10/12/16 2836 10/12/16 6294   10/12/16 0803  ciprofloxacin (CIPRO) IVPB 400 mg     400 mg 200 mL/hr over 60 Minutes Intravenous On call to O.R. 10/12/16 0803 10/12/16 1038      Assessment/Plan: s/p Procedure(s): CHOLECYSTECTOMY COLECTOMY WITH COLOSTOMY CREATION/HARTMANN PROCEDURE (PALIATIVE) PARTIAL  COLECTOMY WITH END OSTOMY CREATION SMALL BOWEL BYPASS Impression: Stable on postoperative day 1. He has been made aware of the surgical findings. We will start Duragesic patch to help with control of pain. Keep on clear liquids at this time.  LOS: 1 day    Patty Lopezgarcia 10/13/2016

## 2016-10-13 NOTE — Anesthesia Postprocedure Evaluation (Signed)
Anesthesia Post Note  Patient: Derek Rodriguez  Procedure(s) Performed: Procedure(s) (LRB): CHOLECYSTECTOMY (N/A) COLECTOMY WITH COLOSTOMY CREATION/HARTMANN PROCEDURE (PALIATIVE) PARTIAL COLECTOMY WITH END OSTOMY CREATION SMALL BOWEL BYPASS (N/A)  Patient location during evaluation: Nursing Unit Anesthesia Type: General Level of consciousness: awake and alert, oriented and patient cooperative Pain management: pain level not controlled (Patient rating pain at 10; PCA was dc'd due to keeping patient awake and ineffective in pain control; now receiving  fentanuyl  IV and  Duragesic patch was placed) Respiratory status: spontaneous breathing, respiratory function stable and patient connected to nasal cannula oxygen Cardiovascular status: stable : Patient had some nausea relieved with Zofran. Anesthetic complications: no     Last Vitals:  Vitals:   10/13/16 0516 10/13/16 1300  BP: 109/77 128/83  Pulse:  95  Resp: 20 20  Temp: 37 C 36.7 C    Last Pain:  Vitals:   10/13/16 1515  TempSrc:   PainSc: 10-Worst pain ever                 ADAMS, AMY A

## 2016-10-13 NOTE — Progress Notes (Signed)
Initial Nutrition Assessment  DOCUMENTATION CODES:  Not applicable  INTERVENTION:  Will order 30 mL Prostat BID, each supplement provides 100 kcal and 15 grams of protein.  While on CL, Boost Breeze po BID, each supplement provides 250 kcal and 9 grams of protein  Once diet is advanced, Ensure Enlive po BID, each supplement provides 350 kcal and 20 grams of protein  NUTRITION DIAGNOSIS:  Increased nutrient needs related to wound healing, cancer and cancer related treatments as evidenced by estimated nutritional requirements for these conditions  GOAL:  Patient will meet greater than or equal to 90% of their needs  MONITOR:  PO intake, Supplement acceptance, Diet advancement, Labs, Weight trends, Direction of care  REASON FOR ASSESSMENT:  Malnutrition Screening Tool    ASSESSMENT:  56 y/o male PMHx Anxiety, HTN, GERD, and 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 Recently, found to have disease recurrence with near obstructing mass 20 cm from anus, now POD 1 palliative partial colectomy with colostomy, side to side enteroenterostomy and cholecystectomy.   RD operating remotely, though Patient is know to RD from past encounters.   Had reported that he had a decrease in appetite and unintentional wt loss on the MST.  During admission ~2 weeks ago had reported nausea, vomiting and postprandial abdominal pain would come be related to his cholecystitis vs recurrent cancer.   His weight from pre-surgery office visit was 208 lbs. He had been 235 lbs at time of his original cancer diagnosis in Late march/early may of 2017, fell to a low of 190 in October, but been regaining weight since he had finished his chemo in November.   During treatment, the pt more or less subsisted on Ensure. Will order Ensure and Clear for him now , with instructions to advance to Ensure as diet is advanced, however, will need to sip slower in absence of Gall bladder. It is uncertain  if pt will undergo chemotherapy. Pending direction of care, RD can follow up to discuss oncologic nutrition again if warranted.  Physical Exam: Unable to assess  Medications: Fentanyl, IVF, Mag sulf. Zofran Labs: Albumin: 3.1, Bilirubin, CEA 10.2   Recent Labs Lab 10/11/16 1052 10/13/16 0611  NA 134* 132*  K 3.7 4.2  CL 98* 99*  CO2 24 23  BUN 15 22*  CREATININE 1.21 1.08  CALCIUM 9.3 8.4*  MG  --  1.4*  PHOS  --  3.2  GLUCOSE 69 85   Diet Order:  Diet clear liquid Room service appropriate? Yes; Fluid consistency: Thin  Skin: New abdominal wounds, colostomy  Last BM:  4/27- now has ostomy.   Height:  Ht Readings from Last 1 Encounters:  10/11/16 6' (1.829 m)   Weight:  Wt Readings from Last 1 Encounters:  10/12/16 222 lb 3.2 oz (100.8 kg)   Wt Readings from Last 10 Encounters:  10/12/16 222 lb 3.2 oz (100.8 kg)  10/11/16 208 lb 6.4 oz (94.5 kg)  10/11/16 208 lb (94.3 kg)  10/04/16 212 lb (96.2 kg)  09/27/16 212 lb 11.2 oz (96.5 kg)  06/08/16 201 lb 11.2 oz (91.5 kg)  05/04/16 206 lb 4.8 oz (93.6 kg)  04/20/16 204 lb 9.6 oz (92.8 kg)  04/20/16 204 lb 9.6 oz (92.8 kg)  04/06/16 201 lb (91.2 kg)   Ideal Body Weight:  80.91 kg  BMI:  Body mass index is 28.2 using recent dry weight kg/m.  Estimated Nutritional Needs:  Kcal:  2200-2350 (23-25 kcal/kg bw) Protein:  97-113g Pro (1.2-1.4 g/kg IBW) Fluid:  2.2-2.4 L (1 ml/kcal)  EDUCATION NEEDS:  Education needs no appropriate at this time  Burtis Junes RD, LDN, [place] Clinical Nutrition Pager: [phone] 10/13/2016 8:48 AM

## 2016-10-14 LAB — BASIC METABOLIC PANEL
Anion gap: 10 (ref 5–15)
BUN: 15 mg/dL (ref 6–20)
CHLORIDE: 99 mmol/L — AB (ref 101–111)
CO2: 24 mmol/L (ref 22–32)
CREATININE: 0.92 mg/dL (ref 0.61–1.24)
Calcium: 8.4 mg/dL — ABNORMAL LOW (ref 8.9–10.3)
GFR calc Af Amer: >60 mL/min (ref >60)
GFR calc non Af Amer: >60 mL/min (ref >60)
Glucose, Bld: 94 mg/dL (ref 65–99)
POTASSIUM: 4 mmol/L (ref 3.5–5.1)
Sodium: 133 mmol/L — ABNORMAL LOW (ref 135–145)

## 2016-10-14 LAB — CBC
HEMATOCRIT: 36 % — AB (ref 39.0–52.0)
Hemoglobin: 12.3 g/dL — ABNORMAL LOW (ref 13.0–17.0)
MCH: 29 pg (ref 26.0–34.0)
MCHC: 34.2 g/dL (ref 30.0–36.0)
MCV: 84.9 fL (ref 78.0–100.0)
PLATELETS: 303 10*3/uL (ref 150–400)
RBC: 4.24 MIL/uL (ref 4.22–5.81)
RDW: 13.1 % (ref 11.5–15.5)
WBC: 10.9 10*3/uL — AB (ref 4.0–10.5)

## 2016-10-14 LAB — MAGNESIUM: Magnesium: 1.7 mg/dL (ref 1.7–2.4)

## 2016-10-14 LAB — PHOSPHORUS: Phosphorus: 2.5 mg/dL (ref 2.5–4.6)

## 2016-10-14 NOTE — Progress Notes (Signed)
2 Days Post-Op  Subjective: Pain control somewhat better with Duragesic patch. Patient taking minimal clear liquids.  Objective: Vital signs in last 24 hours: Temp:  [98 F (36.7 C)-100.3 F (37.9 C)] 98.4 F (36.9 C) (04/29 0626) Pulse Rate:  [95-120] 102 (04/29 0626) Resp:  [20] 20 (04/29 0626) BP: (122-128)/(80-83) 125/80 (04/29 0626) SpO2:  [92 %-97 %] 96 % (04/29 0626) Last BM Date: 10/12/16  Intake/Output from previous day: 04/28 0701 - 04/29 0700 In: 2215 [I.V.:2165; IV Piggyback:50] Out: 1300 [Urine:1300] Intake/Output this shift: No intake/output data recorded.  General appearance: alert, cooperative and fatigued Resp: clear to auscultation bilaterally Cardio: regular rate and rhythm, S1, S2 normal, no murmur, click, rub or gallop GI: Soft, incision healing well. Ostomy pink. Minimal output at the present time.  Lab Results:   Recent Labs  10/13/16 0611 10/14/16 0550  WBC 9.2 10.9*  HGB 13.4 12.3*  HCT 38.0* 36.0*  PLT 316 303   BMET  Recent Labs  10/13/16 0611 10/14/16 0550  NA 132* 133*  K 4.2 4.0  CL 99* 99*  CO2 23 24  GLUCOSE 85 94  BUN 22* 15  CREATININE 1.08 0.92  CALCIUM 8.4* 8.4*   PT/INR No results for input(s): LABPROT, INR in the last 72 hours.  Studies/Results: No results found.  Anti-infectives: Anti-infectives    Start     Dose/Rate Route Frequency Ordered Stop   10/12/16 0803  metroNIDAZOLE (FLAGYL) IVPB 500 mg     500 mg 100 mL/hr over 60 Minutes Intravenous On call to O.R. 10/12/16 6222 10/12/16 9798   10/12/16 0803  ciprofloxacin (CIPRO) IVPB 400 mg     400 mg 200 mL/hr over 60 Minutes Intravenous On call to O.R. 10/12/16 0803 10/12/16 1038      Assessment/Plan: s/p Procedure(s): CHOLECYSTECTOMY COLECTOMY WITH COLOSTOMY CREATION/HARTMANN PROCEDURE (PALIATIVE) PARTIAL COLECTOMY WITH END OSTOMY CREATION SMALL BOWEL BYPASS Impression: Postoperative day 2. Pain seems to be under better control. Patient is fully aware  of the findings at the time of surgery. Adjust IV fluids. Will get enterostomal therapy nurse to see patient tomorrow.  LOS: 2 days    Derek Rodriguez 10/14/2016

## 2016-10-15 LAB — PHOSPHORUS: PHOSPHORUS: 2.9 mg/dL (ref 2.5–4.6)

## 2016-10-15 LAB — CBC
HEMATOCRIT: 37.1 % — AB (ref 39.0–52.0)
HEMOGLOBIN: 13 g/dL (ref 13.0–17.0)
MCH: 30 pg (ref 26.0–34.0)
MCHC: 35 g/dL (ref 30.0–36.0)
MCV: 85.5 fL (ref 78.0–100.0)
Platelets: 306 10*3/uL (ref 150–400)
RBC: 4.34 MIL/uL (ref 4.22–5.81)
RDW: 13.3 % (ref 11.5–15.5)
WBC: 8.4 10*3/uL (ref 4.0–10.5)

## 2016-10-15 LAB — BASIC METABOLIC PANEL
ANION GAP: 11 (ref 5–15)
BUN: 17 mg/dL (ref 6–20)
CHLORIDE: 98 mmol/L — AB (ref 101–111)
CO2: 25 mmol/L (ref 22–32)
Calcium: 8.4 mg/dL — ABNORMAL LOW (ref 8.9–10.3)
Creatinine, Ser: 0.95 mg/dL (ref 0.61–1.24)
GFR calc Af Amer: >60 mL/min (ref >60)
GFR calc non Af Amer: >60 mL/min (ref >60)
GLUCOSE: 87 mg/dL (ref 65–99)
POTASSIUM: 3.8 mmol/L (ref 3.5–5.1)
Sodium: 134 mmol/L — ABNORMAL LOW (ref 135–145)

## 2016-10-15 LAB — MAGNESIUM: Magnesium: 1.6 mg/dL — ABNORMAL LOW (ref 1.7–2.4)

## 2016-10-15 MED ORDER — MAGNESIUM SULFATE 2 GM/50ML IV SOLN
2.0000 g | Freq: Once | INTRAVENOUS | Status: AC
  Administered 2016-10-15: 2 g via INTRAVENOUS
  Filled 2016-10-15: qty 50

## 2016-10-15 MED ORDER — PROMETHAZINE HCL 25 MG/ML IJ SOLN
25.0000 mg | Freq: Four times a day (QID) | INTRAMUSCULAR | Status: DC | PRN
  Administered 2016-10-15 – 2016-10-16 (×5): 25 mg via INTRAVENOUS
  Filled 2016-10-15 (×5): qty 1

## 2016-10-15 NOTE — Progress Notes (Signed)
3 Days Post-Op  Subjective: Patient still having episodes of moderate to severe abdominal pain, though they seem to be easing. His ostomy is started putting out fluid. He still has no significant appetite.  Objective: Vital signs in last 24 hours: Temp:  [98.1 F (36.7 C)-99.2 F (37.3 C)] 98.8 F (37.1 C) (04/30 0500) Pulse Rate:  [98-105] 102 (04/30 0500) Resp:  [20] 20 (04/30 0500) BP: (103-124)/(67-80) 122/80 (04/30 0500) SpO2:  [94 %-95 %] 95 % (04/30 0500) Last BM Date: 10/12/16  Intake/Output from previous day: 04/29 0701 - 04/30 0700 In: 600 [I.V.:600] Out: 800 [Urine:800] Intake/Output this shift: Total I/O In: 120 [P.O.:120] Out: 400 [Stool:400]  General appearance: alert, cooperative and fatigued Resp: clear to auscultation bilaterally Cardio: regular rate and rhythm, S1, S2 normal, no murmur, click, rub or gallop GI: Soft, incision healing well. Ostomy pink and patent.  Lab Results:   Recent Labs  10/14/16 0550 10/15/16 0555  WBC 10.9* 8.4  HGB 12.3* 13.0  HCT 36.0* 37.1*  PLT 303 306   BMET  Recent Labs  10/14/16 0550 10/15/16 0555  NA 133* 134*  K 4.0 3.8  CL 99* 98*  CO2 24 25  GLUCOSE 94 87  BUN 15 17  CREATININE 0.92 0.95  CALCIUM 8.4* 8.4*   PT/INR No results for input(s): LABPROT, INR in the last 72 hours.  Studies/Results: No results found.  Anti-infectives: Anti-infectives    Start     Dose/Rate Route Frequency Ordered Stop   10/12/16 0803  metroNIDAZOLE (FLAGYL) IVPB 500 mg     500 mg 100 mL/hr over 60 Minutes Intravenous On call to O.R. 10/12/16 2633 10/12/16 3545   10/12/16 0803  ciprofloxacin (CIPRO) IVPB 400 mg     400 mg 200 mL/hr over 60 Minutes Intravenous On call to O.R. 10/12/16 0803 10/12/16 1038      Assessment/Plan: s/p Procedure(s): CHOLECYSTECTOMY COLECTOMY WITH COLOSTOMY CREATION/HARTMANN PROCEDURE (PALIATIVE) PARTIAL COLECTOMY WITH END OSTOMY CREATION SMALL BOWEL BYPASS Impression: Postoperative day 3.  Patient slightly better than yesterday. Mild hypomagnesemia is noted. Ostomy is starting to work. Will advance diet slowly. Full liquid diet has been ordered. Will leave [place] in due to palliative care.  LOS: 3 days    Richard Ritchey 10/15/2016

## 2016-10-15 NOTE — Care Management Note (Signed)
Case Management Note  Patient Details  Name: Derek Rodriguez MRN: [id number] Date of Birth: Jun 25, 1960  Subjective/Objective:                  Pt admitted s/p colostomy placement. Pt with colon cancer unsure at this time if he will pursue treatment. Pt lives in [place] with wife. He is ind with ADL's. He has PCP, transportation and no difficulty affording medications. He will need nursing for new ostomy care. Pt/family has no preference over [facility] agency.   Action/Plan: CM will contact [facility] agencies to determine who can provide nursing services at DC. CM will cont to follow.   Expected Discharge Date:     10/17/2016             Expected Discharge Plan:  [place]  In-House Referral:  NA  Discharge planning Services  CM Consult  Post Acute Care Choice:  Home Health Choice offered to:  Patient, Spouse  HH Arranged:  RN [place] Agency:     Status of Service:  In process, will continue to follow  Sherald Barge, RN 10/15/2016, 2:12 PM

## 2016-10-15 NOTE — Consult Note (Signed)
[place] Nurse ostomy consult note Stoma type/location: LLQ colostomy.  Patient in severe pain.  Tearful and does not want pouch change.  Pouch empty except scant blood tinged liquid.  Wife and mother are at bedside.  All agree to pouch change in the AM.  Patient states he will not be able to change the pouch and his wife will be changing everything.  Emotional support provided.  Stomal assessment/size: 1 1/2" visualized through pouch.  Peristomal assessment: not assessed Treatment options for stomal/peristomal skin: none Output blood tinged liquid at this time.  Ostomy pouching: 2pc. 2 1/4" pouch  Education provided: Wife and mother provided with COlostomy booklet.  Discussed frequency of pouch changes and bathing.  Will perform pouch change and ostomy teaching tomorrow.  Patient would like a visit after 10 AM and his wife will be here at all times, she states.  Enrolled patient in [place] program: No WOC team will follow and remain available to patient, medical and nursing teams.  Domenic Moras RN BSN [place] Pager [phone]

## 2016-10-16 ENCOUNTER — Encounter (HOSPITAL_COMMUNITY): Payer: Self-pay | Admitting: General Surgery

## 2016-10-16 LAB — CBC
HEMATOCRIT: 35.4 % — AB (ref 39.0–52.0)
Hemoglobin: 12.2 g/dL — ABNORMAL LOW (ref 13.0–17.0)
MCH: 29.8 pg (ref 26.0–34.0)
MCHC: 34.5 g/dL (ref 30.0–36.0)
MCV: 86.3 fL (ref 78.0–100.0)
Platelets: 265 10*3/uL (ref 150–400)
RBC: 4.1 MIL/uL — ABNORMAL LOW (ref 4.22–5.81)
RDW: 13.5 % (ref 11.5–15.5)
WBC: 5.7 10*3/uL (ref 4.0–10.5)

## 2016-10-16 LAB — BASIC METABOLIC PANEL
Anion gap: 10 (ref 5–15)
BUN: 21 mg/dL — AB (ref 6–20)
CHLORIDE: 100 mmol/L — AB (ref 101–111)
CO2: 26 mmol/L (ref 22–32)
CREATININE: 1 mg/dL (ref 0.61–1.24)
Calcium: 8.3 mg/dL — ABNORMAL LOW (ref 8.9–10.3)
GFR calc Af Amer: >60 mL/min (ref >60)
GFR calc non Af Amer: >60 mL/min (ref >60)
GLUCOSE: 80 mg/dL (ref 65–99)
POTASSIUM: 3.7 mmol/L (ref 3.5–5.1)
Sodium: 136 mmol/L (ref 135–145)

## 2016-10-16 LAB — MAGNESIUM: Magnesium: 1.8 mg/dL (ref 1.7–2.4)

## 2016-10-16 MED ORDER — ALUM & MAG HYDROXIDE-SIMETH 200-200-20 MG/5ML PO SUSP: 30.0000 mL | ORAL | Status: DC | PRN

## 2016-10-16 NOTE — Consult Note (Signed)
[place] Nurse ostomy follow up Stoma type/location: LLQ Colostomy Stomal assessment/size: 1 3/8" edematous, flush stoma, abdomen is distended.  Peristomal assessment: Erythema, some effluent was on skin.  Will add barrier ring.  Treatment options for stomal/peristomal skin: Barrier ring Output Soft brown stool in pouch.  Ostomy pouching: 1pc.convex pouch used today. Explained use of convexity and barrier ring.  Patient does not participate. Wife measures and cuts pouch and applies today.  Has been reviewing written materials.  Asks appropriate questions.    Enrolled patient in [place] Start Discharge program: Yes today [place] team will follow.  Domenic Moras RN BSN [place] Pager [phone]

## 2016-10-16 NOTE — Progress Notes (Signed)
Patient refused his lovenox injection.  I verbalized to him the risk and benefits of taking the injection.  Patient verbalized that he will be OOB to chair and would not like to take the injection at this time. I held the injection at this time.

## 2016-10-16 NOTE — Progress Notes (Signed)
4 Days Post-Op  Subjective: Patient had less pain yesterday evening. Still requiring Duragesic patch and IV pain medication. Is having some heartburn, but no emesis.  Objective: Vital signs in last 24 hours: Temp:  [98.3 F (36.8 C)-99.2 F (37.3 C)] 98.3 F (36.8 C) (05/01 2122) Pulse Rate:  [94-101] 94 (05/01 0632) Resp:  [18-20] 18 (05/01 4825) BP: (113-120)/(77-83) 113/83 (05/01 0037) SpO2:  [94 %-96 %] 95 % (05/01 0488) Last BM Date: 10/15/16  Intake/Output from previous day: 04/30 0701 - 05/01 0700 In: 1410 [P.O.:360; I.V.:1050] Out: 1200 [Urine:600; Stool:600] Intake/Output this shift: Total I/O In: 120 [P.O.:120] Out: -   General appearance: alert, cooperative and no distress Resp: clear to auscultation bilaterally Cardio: regular rate and rhythm, S1, S2 normal, no murmur, click, rub or gallop GI: Soft, incision healing well. Ostomy pink and patent. Active bowel sounds appreciated.  Lab Results:   Recent Labs  10/15/16 0555 10/16/16 0518  WBC 8.4 5.7  HGB 13.0 12.2*  HCT 37.1* 35.4*  PLT 306 265   BMET  Recent Labs  10/15/16 0555 10/16/16 0518  NA 134* 136  K 3.8 3.7  CL 98* 100*  CO2 25 26  GLUCOSE 87 80  BUN 17 21*  CREATININE 0.95 1.00  CALCIUM 8.4* 8.3*   PT/INR No results for input(s): LABPROT, INR in the last 72 hours.  Studies/Results: No results found.  Anti-infectives: Anti-infectives    Start     Dose/Rate Route Frequency Ordered Stop   10/12/16 0803  metroNIDAZOLE (FLAGYL) IVPB 500 mg     500 mg 100 mL/hr over 60 Minutes Intravenous On call to O.R. 10/12/16 8916 10/12/16 9450   10/12/16 0803  ciprofloxacin (CIPRO) IVPB 400 mg     400 mg 200 mL/hr over 60 Minutes Intravenous On call to O.R. 10/12/16 0803 10/12/16 1038      Assessment/Plan: s/p Procedure(s): CHOLECYSTECTOMY COLECTOMY WITH COLOSTOMY CREATION/HARTMANN PROCEDURE (PALIATIVE) PARTIAL COLECTOMY WITH END OSTOMY CREATION SMALL BOWEL BYPASS Impression:  Postoperative day 4. Ostomy is working well. Pain from his incision slightly improved. We'll get patient up to chair. Have consulted home health. Increase to soft diet. Continue supportive care.  LOS: 4 days    Derek Rodriguez 10/16/2016

## 2016-10-16 NOTE — Care Management (Signed)
[facility] able to provide nursing services. CM has faxed pt clnical info and [place] orders. Pt/wife aware and agreeable to [facility] services through [place] at this time. Will check back with [facility] agency and pt tomorrow.

## 2016-10-17 MED ORDER — CYCLOBENZAPRINE HCL 10 MG PO TABS
10.0000 mg | ORAL_TABLET | Freq: Three times a day (TID) | ORAL | Status: DC
  Administered 2016-10-17 – 2016-10-19 (×9): 10 mg via ORAL
  Filled 2016-10-17 (×9): qty 1

## 2016-10-17 MED ORDER — PANTOPRAZOLE SODIUM 40 MG PO TBEC
40.0000 mg | DELAYED_RELEASE_TABLET | Freq: Every day | ORAL | Status: DC
  Administered 2016-10-17 – 2016-10-20 (×4): 40 mg via ORAL
  Filled 2016-10-17 (×4): qty 1

## 2016-10-17 NOTE — Progress Notes (Signed)
5 Days Post-Op  Subjective: Patient did sit up yesterday. Still having episodes of muscle spasm and pain.  Objective: Vital signs in last 24 hours: Temp:  [98.1 F (36.7 C)-99.1 F (37.3 C)] 99.1 F (37.3 C) (05/02 0545) Pulse Rate:  [97-100] 100 (05/02 0545) Resp:  [18-20] 20 (05/02 0545) BP: (103-114)/(64-73) 103/64 (05/02 0545) SpO2:  [94 %-97 %] 94 % (05/02 0545) Last BM Date: 10/16/16  Intake/Output from previous day: 05/01 0701 - 05/02 0700 In: [place] [P.O.:480; I.V.:1300] Out: 850 [Urine:650; Stool:200] Intake/Output this shift: No intake/output data recorded.  General appearance: alert, cooperative, fatigued and no distress Resp: clear to auscultation bilaterally Cardio: regular rate and rhythm, S1, S2 normal, no murmur, click, rub or gallop GI: Soft, incision healing well. Bowel sounds active. Ostomy pink and patent.  Lab Results:   Recent Labs  10/15/16 0555 10/16/16 0518  WBC 8.4 5.7  HGB 13.0 12.2*  HCT 37.1* 35.4*  PLT 306 265   BMET  Recent Labs  10/15/16 0555 10/16/16 0518  NA 134* 136  K 3.8 3.7  CL 98* 100*  CO2 25 26  GLUCOSE 87 80  BUN 17 21*  CREATININE 0.95 1.00  CALCIUM 8.4* 8.3*   PT/INR No results for input(s): LABPROT, INR in the last 72 hours.  Studies/Results: No results found.  Anti-infectives: Anti-infectives    Start     Dose/Rate Route Frequency Ordered Stop   10/12/16 0803  metroNIDAZOLE (FLAGYL) IVPB 500 mg     500 mg 100 mL/hr over 60 Minutes Intravenous On call to O.R. 10/12/16 1610 10/12/16 0937   10/12/16 0803  ciprofloxacin (CIPRO) IVPB 400 mg     400 mg 200 mL/hr over 60 Minutes Intravenous On call to O.R. 10/12/16 0803 10/12/16 1038      Assessment/Plan: s/p Procedure(s): CHOLECYSTECTOMY COLECTOMY WITH COLOSTOMY CREATION/HARTMANN PROCEDURE (PALIATIVE) PARTIAL COLECTOMY WITH END OSTOMY CREATION SMALL BOWEL BYPASS Impression: Final pathology reveals metastatic adenocarcinoma, even involving the wall of the  gallbladder. Patient has stated in the past that he will refuse any further chemotherapy. Oncology does not believe there is anything they can offer at this time. We will discuss with the patient tomorrow hospice. The wife is aware that this was extensive cancer that cannot be resected. Patient is also aware. Will try Flexeril to help out with muscle spasms.  LOS: 5 days    Hermon Zea 10/17/2016

## 2016-10-17 NOTE — Progress Notes (Signed)
Patient reported that colostomy bag was leaking, WOC nurse contacted without response. [place] nurse left extra at bedside which was used to change the bag.  Celestia Khat, RN

## 2016-10-18 NOTE — Care Management (Signed)
Patient Information   Patient Name Nirav, Sweda ([id number]) Sex Male DOB 09/09/1960  Room Bed  A321 A321-01  Patient Demographics   Address [address] [place] [address] [place] [address] Phone [phone] [facility]) *Preferred* [phone] (Work) [phone] (Mobile)  Patient Ethnicity & Race   Ethnic Group Patient Race  Not Hispanic or Latino White or Caucasian  Emergency Contact(s)   Name Relation Home Work [place] [phone]    Documents on File    Status Date Received Description  Documents for the Patient  [place] Not Received  r  [place] E-Signature HIPAA Notice of Privacy Received 33/61/22   Driver's License Not Received    Insurance Card Received 08/22/15 BCBS 2017  Advance Directives/Living Will/HCPOA/POA Not Received    Other Photo ID Not Received    HIM ROI Authorization  08/31/15 Last CT and ER notes for continuity of care. Patient is having a colonoscopy on 09/01/15 and he wants his records faxed to the attention of his wife, Jaidyn Kuhl, who works for [employer].  Release of Information  09/01/15   HIM ROI Authorization  09/12/15 Patient needs records from inpatient stay of 09/09/15 to 09/12/15 for short term disability.  Release of Information  09/13/15   Release of Information  09/14/15   HIM ROI Authorization  09/15/15 [employer]  HIM ROI Authorization  10/07/15 D/C Summary, H&P, Op Note, Path report from 10/03/15 to 10/07/15.   Release of Information  10/07/15   Insurance Card Received 10/18/15   HIM ROI Authorization  11/03/15   AMB Provider Completed Forms  11/25/15 11/25/2015 PRIOR AUTHORIZATION INFU SYSTEM  AMB Correspondence  12/14/15 PRE VERIFICATION Carita Pian FORM/  INFUSYSTEM  Release of Information Received 02/06/16 rga  HIM ROI Authorization  02/06/16   HIM ROI Authorization  04/13/16 [place]  06/27/16  [place] E-Signature HIPAA Notice of Privacy [place]     Advanced Beneficiary Notice (ABN) Not Received    E-Signature AOB [place] Not Received    Documents for the Encounter  AOB (Assignment of Insurance Benefits) Not Received    E-signature AOB Signed 10/12/16   MEDICARE RIGHTS Not Received    E-signature Medicare Rights     Admission Information   Attending Provider Admitting Provider Admission Type Admission Date/Time  Aviva Signs, MD Aviva Signs, MD Urgent 10/12/16 [phone]  Discharge Date Hospital Service Auth/Cert Status Service Area   Surgery Incomplete [place]  Unit Room/Bed Admission Status   AP-DEPT 300 A321/A321-01 Admission (Confirmed)   Admission   Complaint  recurrent colon cancer, cholecystitis  Hospital Account   Name Acct ID Class Status Primary Coverage  Kelsen, [place] [id number] Inpatient Open [place]      Guarantor Account (for [place] [id number])   Name Relation to Pt Service Area Active? Acct Type  [place] Fermo T Self [facility]SA Yes Personal/Family  Address Phone    [address] [place] [address] [phone]) [phone])        Coverage Information (for Hospital Account [id number])   F/O Payor/Plan Precert #  BLUE CROSS BLUE SHIELD/BCBS OTHER   Subscriber Subscriber #  [place], Perrell [id number]  Address Phone  [address] [place]

## 2016-10-18 NOTE — Progress Notes (Signed)
6 Days Post-Op  Subjective: Patient's pain has been under better control. Less muscle spasms noted. Is increasing his diet.  Objective: Vital signs in last 24 hours: Temp:  [98.3 F (36.8 C)-98.6 F (37 C)] 98.3 F (36.8 C) (05/03 0559) Pulse Rate:  [82-100] 100 (05/03 0559) Resp:  [15-20] 20 (05/03 0559) BP: (103-115)/(62-87) 103/87 (05/03 0559) SpO2:  [93 %-97 %] 96 % (05/03 0559) Last BM Date: 10/17/16  Intake/Output from previous day: 05/02 0701 - 05/03 0700 In: 360 [P.O.:360] Out: 950 [Urine:950] Intake/Output this shift: No intake/output data recorded.  General appearance: alert, cooperative and no distress Resp: clear to auscultation bilaterally Cardio: regular rate and rhythm, S1, S2 normal, no murmur, click, rub or gallop GI: Soft, incision healing well. Ostomy pink and patent. Bowel sounds active.  Lab Results:   Recent Labs  10/16/16 0518  WBC 5.7  HGB 12.2*  HCT 35.4*  PLT 265   BMET  Recent Labs  10/16/16 0518  NA 136  K 3.7  CL 100*  CO2 26  GLUCOSE 80  BUN 21*  CREATININE 1.00  CALCIUM 8.3*   PT/INR No results for input(s): LABPROT, INR in the last 72 hours.  Studies/Results: No results found.  Anti-infectives: Anti-infectives    Start     Dose/Rate Route Frequency Ordered Stop   10/12/16 0803  metroNIDAZOLE (FLAGYL) IVPB 500 mg     500 mg 100 mL/hr over 60 Minutes Intravenous On call to O.R. 10/12/16 8250 10/12/16 5397   10/12/16 0803  ciprofloxacin (CIPRO) IVPB 400 mg     400 mg 200 mL/hr over 60 Minutes Intravenous On call to O.R. 10/12/16 0803 10/12/16 1038      Assessment/Plan: s/p Procedure(s): CHOLECYSTECTOMY COLECTOMY WITH COLOSTOMY CREATION/HARTMANN PROCEDURE (PALIATIVE) PARTIAL COLECTOMY WITH END OSTOMY CREATION SMALL BOWEL BYPASS Impression: Recurrent metastatic colon carcinoma, status post palliative surgery. I had a discussion with both the patient and wife. I have recommended hospice consultation. He has expressed  an interest in getting a second opinion at a tertiary care center. I told him I can arrange this as an outpatient. I do think his options are limited given the extent of his metastatic disease. This was relayed to the patient. Will start ambulating patient.  LOS: 6 days    Derek Rodriguez 10/18/2016

## 2016-10-18 NOTE — Care Management Note (Signed)
Case Management Note  Patient Details  Name: Derek Rodriguez MRN: [id number] Date of Birth: May 10, 1961  Expected Discharge Date:       10/21/2016           Expected Discharge Plan:  Home w Hospice Care  In-House Referral:  NA  Discharge planning Services  CM Consult  Post Acute Care Choice:  Hospice Choice offered to:  Patient, Spouse  HH Arranged:  RN [place] Agency:   (TBD)  Status of Service:  In process, will continue to follow  If discussed at Long Length of Stay Meetings, dates discussed:  10/18/2016 Additional Comments: MD discussed hospice with pt today. Pt agreeable but also wants second opinion. Pt's wife at bedside. Options of hospice providers discussed. Pt would like to stay away from [facility] if possible. [facility] unable to provide services in [place]. Call placed to [facility] and palliative care. Awaiting return call.   Sherald Barge, RN 10/18/2016, 11:07 AM

## 2016-10-18 NOTE — Care Management (Signed)
[facility] not able to services area pt lives in. [facility], contacted and faxed updated info. Per, Shirlean Mylar, they will be able to take referral for Hospice. Will need hospice order faxed once put in my MD. CM cont to follow. Anticipate DC home over weekend.

## 2016-10-18 NOTE — Consult Note (Signed)
[place] Nurse ostomy follow up Stoma type/location: LLQ Colostomy Stomal assessment/size: 1 3/8" slightly oval Peristomal assessment: erythema Treatment options for stomal/peristomal skin: barrier ring Output soft brown stool Ostomy pouching: 1pc.convex Education provided: Wife at bedside.  Observed a pouch change last night.  Patient complained of burning and pouch was changed.  Wife states she is comfortable with changing and emptying pouch.   Enrolled patient in [place] Start Discharge program: Yes  Will not follow at this time.  Please re-consult if needed. Discharging home  With Hospice services per wife.  Domenic Moras RN BSN [place] Pager [phone]

## 2016-10-18 NOTE — Progress Notes (Signed)
Patient refused to allow RN and NT to bathe him and change his sheets stating he "would like to be left alone today." Patient agreed to foley and peri care.

## 2016-10-19 MED ORDER — HYDROMORPHONE HCL 2 MG PO TABS
2.0000 mg | ORAL_TABLET | ORAL | Status: DC | PRN
  Administered 2016-10-19 – 2016-10-21 (×11): 2 mg via ORAL
  Filled 2016-10-19 (×11): qty 1

## 2016-10-19 MED ORDER — FENTANYL 100 MCG/HR TD PT72
100.0000 ug | MEDICATED_PATCH | TRANSDERMAL | Status: DC
  Administered 2016-10-19: 100 ug via TRANSDERMAL
  Filled 2016-10-19: qty 1

## 2016-10-19 NOTE — Care Management (Signed)
[facility] results, pathology report, meds and todays progress note. Oncology not will ing to be hospice provider, [facility] will ask their director to follow pt. Anticipate DC home over weekend.

## 2016-10-19 NOTE — Progress Notes (Signed)
7 Days Post-Op  Subjective: Patient's pain is still present and he is having a difficult time swallowing the Percocet pills.  Objective: Vital signs in last 24 hours: Temp:  [98.3 F (36.8 C)-98.8 F (37.1 C)] 98.8 F (37.1 C) (05/04 0600) Pulse Rate:  [89-97] 97 (05/04 0600) Resp:  [15-20] 18 (05/04 0600) BP: (110-123)/(67-76) 114/71 (05/04 0600) SpO2:  [92 %-98 %] 97 % (05/04 0600) Last BM Date: 10/17/16  Intake/Output from previous day: 05/03 0701 - 05/04 0700 In: 720 [P.O.:720] Out: 1200 [Urine:1200] Intake/Output this shift: No intake/output data recorded.  General appearance: alert, cooperative and no distress Resp: clear to auscultation bilaterally Cardio: regular rate and rhythm, S1, S2 normal, no murmur, click, rub or gallop GI: Soft, incision healing well. Ostomy pink and patent.  Lab Results:  No results for input(s): WBC, HGB, HCT, PLT in the last 72 hours. BMET No results for input(s): NA, K, CL, CO2, GLUCOSE, BUN, CREATININE, CALCIUM in the last 72 hours. PT/INR No results for input(s): LABPROT, INR in the last 72 hours.  Studies/Results: No results found.  Anti-infectives: Anti-infectives    Start     Dose/Rate Route Frequency Ordered Stop   10/12/16 0803  metroNIDAZOLE (FLAGYL) IVPB 500 mg     500 mg 100 mL/hr over 60 Minutes Intravenous On call to O.R. 10/12/16 3151 10/12/16 7616   10/12/16 0803  ciprofloxacin (CIPRO) IVPB 400 mg     400 mg 200 mL/hr over 60 Minutes Intravenous On call to O.R. 10/12/16 0803 10/12/16 1038      Assessment/Plan: s/p Procedure(s): CHOLECYSTECTOMY COLECTOMY WITH COLOSTOMY CREATION/HARTMANN PROCEDURE (PALIATIVE) PARTIAL COLECTOMY WITH END OSTOMY CREATION SMALL BOWEL BYPASS Impression: Patient slowly improving. We will adjust pain medication in anticipation of discharge. Will remove Foley. Hopefully, we will discharge patient in next 24-48 hours. I will arrange for an outpatient consultation with surgical oncology at  [facility] for further evaluation and treatment, should the patient desire this. He is comfortable with Hospice service follow-up.  LOS: 7 days    Khadar Monger 10/19/2016

## 2016-10-20 MED ORDER — ALPRAZOLAM 1 MG PO TABS
1.0000 mg | ORAL_TABLET | Freq: Three times a day (TID) | ORAL | Status: DC | PRN
  Administered 2016-10-21: 1 mg via ORAL
  Filled 2016-10-20: qty 1

## 2016-10-20 MED ORDER — CYCLOBENZAPRINE HCL 10 MG PO TABS
10.0000 mg | ORAL_TABLET | Freq: Three times a day (TID) | ORAL | Status: DC | PRN
  Administered 2016-10-20 – 2016-10-21 (×3): 10 mg via ORAL
  Filled 2016-10-20 (×3): qty 1

## 2016-10-20 NOTE — Progress Notes (Signed)
8 Days Post-Op  Subjective: Patient is slightly improved over yesterday. He still has occasional lightheadedness, which is probably secondary to his multiple pain medications and anxiolytics.  Objective: Vital signs in last 24 hours: Temp:  [98.5 F (36.9 C)-99.2 F (37.3 C)] 98.5 F (36.9 C) (05/05 0702) Pulse Rate:  [86-99] 86 (05/05 0702) Resp:  [18-20] 18 (05/05 0702) BP: (110-117)/(63-75) 117/75 (05/05 0702) SpO2:  [96 %-98 %] 96 % (05/05 0702) Last BM Date: 10/20/16  Intake/Output from previous day: 05/04 0701 - 05/05 0700 In: 120 [P.O.:120] Out: 500 [Urine:500] Intake/Output this shift: No intake/output data recorded.  General appearance: alert, cooperative and no distress Resp: clear to auscultation bilaterally Cardio: regular rate and rhythm, S1, S2 normal, no murmur, click, rub or gallop GI: Soft, ostomy pink and patent. Incision healing well. Bowel sounds active.  Lab Results:  No results for input(s): WBC, HGB, HCT, PLT in the last 72 hours. BMET No results for input(s): NA, K, CL, CO2, GLUCOSE, BUN, CREATININE, CALCIUM in the last 72 hours. PT/INR No results for input(s): LABPROT, INR in the last 72 hours.  Studies/Results: No results found.  Anti-infectives: Anti-infectives    Start     Dose/Rate Route Frequency Ordered Stop   10/12/16 0803  metroNIDAZOLE (FLAGYL) IVPB 500 mg     500 mg 100 mL/hr over 60 Minutes Intravenous On call to O.R. 10/12/16 5072 10/12/16 2575   10/12/16 0803  ciprofloxacin (CIPRO) IVPB 400 mg     400 mg 200 mL/hr over 60 Minutes Intravenous On call to O.R. 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 Palliative surgery performed. I have adjusted his pain medication. We will  decrease frequency of Flexeril as he is not having as many muscle spasms of the abdominal wall. He was instructed to try to ambulate in the hallway. Anticipate discharge in next 24 hours.  LOS: 8 days    Khrystian Schauf 10/20/2016

## 2016-10-21 MED ORDER — ALPRAZOLAM 1 MG PO TABS: 1.0000 mg | ORAL_TABLET | Freq: Three times a day (TID) | ORAL | 0 refills | Status: DC | PRN

## 2016-10-21 MED ORDER — FENTANYL 100 MCG/HR TD PT72: 100.0000 ug | MEDICATED_PATCH | TRANSDERMAL | 0 refills | Status: DC

## 2016-10-21 MED ORDER — ONDANSETRON 4 MG PO TBDP: 4.0000 mg | ORAL_TABLET | Freq: Four times a day (QID) | ORAL | 1 refills | Status: AC | PRN

## 2016-10-21 MED ORDER — HYDROMORPHONE HCL 2 MG PO TABS: 2.0000 mg | ORAL_TABLET | ORAL | 0 refills | Status: DC | PRN

## 2016-10-21 MED ORDER — OXYCODONE-ACETAMINOPHEN 7.5-325 MG PO TABS: 1.0000 | ORAL_TABLET | ORAL | 0 refills | Status: DC | PRN

## 2016-10-21 MED ORDER — CYCLOBENZAPRINE HCL 10 MG PO TABS: 10.0000 mg | ORAL_TABLET | Freq: Three times a day (TID) | ORAL | 1 refills | Status: AC | PRN

## 2016-10-21 NOTE — Progress Notes (Signed)
Received call from Mary-Beth, RN to confirm that Hospice services had been arranged so that discharge could be completed today. CM found no numbers in Chart so called [facility] @ [phone] and LM for on call RN to return call and confirm arrangements.

## 2016-10-21 NOTE — Discharge Instructions (Signed)
Colostomy Home Guide, Adult A colostomy is a surgical procedure to make an opening (stoma) for stool (feces) to leave your body. This surgery is done when a medical condition prevents stool from leaving your body through the end of the large intestine (rectum). During the surgery, part of the large intestine (colon) is attached to the stoma that is made in the front of your abdomen. A bag (pouch) is fitted over the stoma. Stool and gas will collect in the bag. After having this surgery, you will need to empty and change your colostomy bag as needed. You will also need to care for the stoma. How do I care for my stoma? Your stoma should look pink, red, and moist, like the inside of your cheek. At first, the stoma may be swollen, but this swelling will go away within 6 weeks. To care for the stoma:  Keep the skin around the stoma clean and dry.  Use a clean, soft washcloth to gently wash the stoma and the skin around it.  Use warm water and only use cleansers recommended by your health care provider.  Rinse the stoma area with plain water.  Dry the area well.  Use stoma powder or ointment on your skin only as told by your health care provider. Do not use any other powders, gels, wipes, or creams on your skin.  Change your colostomy bag if your skin becomes irritated. Irritation may indicate that the bag is leaking.  Check your stoma area every day for signs of infection. Check for:  More redness, swelling, or pain.  More fluid or blood.  Pus or warmth.  Measure the stoma opening regularly and record the size. Watch for changes. Share this information with your health care provider. How do I care for my colostomy bag? The bag that fits over the stoma can have either one or two pieces.  One-piece bag: The skin barrier and the bag are combined in a single unit.  Two-piece bag: The skin barrier and the bag are separate pieces that attach to each other. 1.  Empty your bag at bedtime and  whenever it is one-third to one-half full. Do not let more stool or gas build up. This could cause the bag to leak. Some colostomy bags have a built-in gas release valve. Change the bag every 3-4 days or as told by your health care provider. Also change the bag if it is leaking or separating from the skin or your skin looks irritated. How do I empty my colostomy bag? Before you leave the hospital, you will be taught how to empty your bag. Follow these basic steps: 1. Wash your hands with soap and water. 2. Sit far back on the toilet. 3. Put several pieces of toilet paper into the toilet water. This will prevent splashing as you empty the stool into the toilet. 4. Remove the clip or the velcro from the tail end of the bag. 5. Unroll the tail, then empty stool into the toilet. 6.  7. Reroll the tail, and close it with the clip or velcro. 8. Wash your hands again. How do I change my colostomy bag? Before you leave the hospital, you will be taught how to change your bag. Always have colostomy supplies with you, and follow these basic steps: 1. Wash your hands with soap and water. Have paper towels or tissues near you to clean any discharge. 2. Use a template to pre-cut the skin barrier. Smooth any rough edges. 3. If using  a two-piece bag, attach the bag and the skin barrier to each other. Add the barrier ring, if you use one. 4. If your stools are watery, add a few cotton balls to the new bag to absorb the liquid. 5. Remove the old bag and skin barrier. Gently push the skin away from the barrier with your fingers or a warm cloth. 6. Wash your hands again. Then clean the stoma area as directed with water or with mild soap and water. Use water to rinse away any soap. 7. Dry the skin. You may use the cool setting on a hair dryer to do this. 8. If directed, apply stoma powder or skin barrier gel to the skin. 9. Dry the skin again. 10. Warm the skin barrier with your hands or a warm  compress. 11. Remove the paper from the sticky (adhesive) strip of the skin barrier. 12. Press the adhesive strip onto the skin around the stoma. 13. Gently rub the skin barrier onto the skin. This creates heat that helps the barrier to stick. 14. Apply stoma tape to the edges of the skin barrier. What are some general tips?  Avoid wearing clothes that are tight directly over your stoma.  You may shower or bathe with the colostomy bag on or off. Do not use harsh or oily soaps or lotions. Dry the skin and bag after bathing.  Store all supplies in a cool, dry place. Do not leave supplies in extreme heat because parts can melt.  Whenever you leave home, take an extra skin barrier and colostomy bag with you.  If your colostomy bag gets wet, you can dry it with a hair dryer on the cool setting.  To prevent odor, put drops of ostomy deodorizer in the colostomy bag. Your health care provider may also recommend putting ostomy lubricant inside the bag. This helps the stool to slide out of the bag more easily and completely. Contact a health care provider if:  You have more redness, swelling, or pain around your stoma.  You have more fluid or blood coming from your stoma.  Your stoma feels warm to the touch.  You have pus coming from your stoma.  Your stoma extends in or out farther than normal.  You need to change the bag every day.  You have a fever. Get help right away if:  Your stool is bloody.  You vomit.  You have trouble breathing. This information is not intended to replace advice given to you by your health care provider. Make sure you discuss any questions you have with your health care provider. Document Released: 06/07/2003 Document Revised: 10/13/2015 Document Reviewed: 10/07/2013 Elsevier Interactive Patient Education  2017 [employer] Colostomy, Adult, Care After Refer to this sheet in the next few weeks. These instructions provide you with information about caring  for yourself after your procedure. Your health care provider may also give you more specific instructions. Your treatment has been planned according to current medical practices, but problems sometimes occur. Call your health care provider if you have any problems or questions after your procedure. What can I expect after the procedure? After the procedure, it is common to have:  Swelling at the opening that was created during the procedure (stoma).  Slight bleeding around the stoma.  Redness around the stoma. Follow these instructions at home: Activity   Rest as needed while the stoma area heals.  Return to your normal activities as told by your health care provider. Ask your health care  provider what activities are safe for you.  Avoid strenuous activity and abdominal exercises for 3 weeks or for as long as told by your health care provider.  Do not lift anything that is heavier than 10 lb (4.5 kg). Incision care    Follow instructions from your health care provider about how to take care of your incision. Make sure you:  Wash your hands with soap and water before you change your bandage (dressing). If soap and water are not available, use hand sanitizer.  Change your dressing as told by your health care provider.  Leave stitches (sutures), skin glue, or adhesive strips in place. These skin closures may need to stay in place for 2 weeks or longer. If adhesive strip edges start to loosen and curl up, you may trim the loose edges. Do not remove adhesive strips completely unless your health care provider tells you to do that. Stoma Care   Keep the stoma area clean.  Clean and dry the skin around the stoma each time you change the colostomy bag. To clean the stoma area:  Use warm water and only use cleansers that are recommended by your health care provider.  Rinse the stoma area with plain water.  Dry the area well.  Use stoma powder or ointment on your skin only as told by  your health care provider. Do not use any other powders, gels, wipes, or creams on your skin.  Check the stoma area every day for signs of infection. Check for:  More redness, swelling, or pain.  More fluid or blood.  Pus or warmth.  Measure the stoma opening regularly and record the size. Watch for changes. Share this information with your health care provider. Bathing   Do not take baths, swim, or use a hot tub until your health care provider approves. Ask your health care provider if you can take showers. You may be able to shower with or without the colostomy bag in place. If you bathe with the bag on, dry the bag afterward.  Avoid using harsh or oily soaps when you bathe. Colostomy Bag Care   Follow instructions from your health care provider about how to empty or change the colostomy bag.  Keep colostomy supplies with you at all times.  Store all supplies in a cool, dry place.  Empty the colostomy bag:  Whenever it is one-third to one-half full.  At bedtime.  Replace the bag every 2-4 days or as told by your health care provider. Driving   Do not drive for 24 hours if you received a sedative.  Do not drive or operate heavy machinery while taking prescription pain medicine. General instructions   Follow instructions from your health care provider about eating or drinking restrictions.  Take over-the-counter and prescription medicines only as told by your health care provider.  Avoid wearing clothes that are tight directly over your stoma.  Do not use any tobacco products, such as cigarettes, chewing tobacco, and e-cigarettes. If you need help quitting, ask your health care provider.  (Women) Ask your health care provider about becoming pregnant and about using birth control. Medicines may not be absorbed normally after the procedure.  Keep all follow-up visits as told by your health care provider. This is important. Contact a health care provider if:  You are  having trouble caring for your stoma or changing the colostomy bag.  You feel nauseous or you vomit.  You have a fever.  You havemore redness, swelling, or pain  at the site of your stoma or around your anus.  You have more fluid or blood coming from your stoma or your anus.  Your stoma area feels warm to the touch.  You have pus coming from your stoma.  You notice a change in the size or appearance of the stoma.  You have abdominal pain, bloating, pressure, or cramping.  Your have stool more often or less often than your health care provider tells you to expect.  You are not making much urine. This may be a sign of dehydration. Get help right away if:  Your abdominal pain does not go away or it becomes severe.  You keep vomiting.  Your stool is not draining through the stoma.  You have chest pain or an irregular heartbeat. This information is not intended to replace advice given to you by your health care provider. Make sure you discuss any questions you have with your health care provider. Document Released: 10/25/2010 Document Revised: 10/13/2015 Document Reviewed: 02/15/2015 Elsevier Interactive Patient Education  2017 [employer].

## 2016-10-21 NOTE — Progress Notes (Addendum)
Patient's IV removed.  Site WNL.  AVS reviewed with patient and patient's wife.  Verbalized understanding of discharge instructions, physician follow-up, medications.  Prescriptions given to patient at discharge.  Patient transported by wheelchair to main entrance at discharge.  Patient stable at time of discharge.

## 2016-10-21 NOTE — Progress Notes (Signed)
RN called case Freight forwarder at [employer] regarding [place] and hospice at discharge.  Case manager to verify information with [facility] and hospice and return call.

## 2016-10-21 NOTE — Discharge Summary (Addendum)
Physician Discharge Summary  Patient ID: Derek Rodriguez MRN: [id number] DOB/AGE: 1960-11-11 56 y.o.  Admit date: 10/12/2016 Discharge date: 10/21/2016  Admission Diagnoses:Recurrent colon carcinoma, cholecystitis, cholelithiasis  Discharge Diagnoses: Same, stage IV colon cancer Active Problems:   Primary colon cancer with invasion or adherence to other organ or structure (T4b) [facility])   Discharged Condition: fair  Hospital Course: Patient is a 56 year old white male status post partial colectomy in April 2017 who underwent chemotherapy but could not complete the course due to intolerance, who presented [facility] in April 2018 and was found to have cholecystitis, cholelithiasis, and evidence of intra-abdominal mass in the left lower quadrant. He underwent a colonoscopy which revealed a near obstructing mass that was biopsy-proven to be colon carcinoma. He subsequently underwent an open cholecystectomy, Hartman's procedure on 10/12/2016 for palliation. Final pathology revealed the wall of the gallbladder to contain adenocarcinoma of colon. The left lower quadrant mass was invading multiple structures including the large and small bowel, with resultant inability to fully resect. His postoperative course was remarkable for difficulty and pain control. Once his bowel function returned, his diet was advanced without difficulty. After extensive discussion with the patient and family, the patient was referred to hospice for further management. Patient is being discharged home on 10/21/2016 in fair and stable condition.  Treatments: surgery: Hartman's procedure, cholecystectomy, enteroenterostomy on 10/12/2016  Discharge Exam: Blood pressure 116/65, pulse 92, temperature 98.4 F (36.9 C), temperature source Oral, resp. rate 18, weight 222 lb 3.2 oz (100.8 kg), SpO2 94 %. General appearance: alert, cooperative and no distress Resp: clear to auscultation bilaterally Cardio: regular rate and  rhythm, S1, S2 normal, no murmur, click, rub or gallop GI: Soft, incision healing well. Ostomy pink and patent.  Disposition: 01-Home or Self Care  Discharge Instructions    Diet - low sodium heart healthy    Complete by:  As directed    Increase activity slowly    Complete by:  As directed      Allergies as of 10/21/2016   No Known Allergies     Medication List    STOP taking these medications   ciprofloxacin 500 MG tablet Commonly known as:  CIPRO   [facility] BISACODYL 10 MG suppository Generic drug:  bisacodyl   oxyCODONE-acetaminophen 7.5-325 MG tablet Commonly known as:  PERCOCET     TAKE these medications   ALEVE PM 220-25 MG Tabs Generic drug:  Naproxen Sod-Diphenhydramine Take 1 tablet by mouth at bedtime as needed (for sleep (when he does NOT take AMBIEN)).   ALPRAZolam 1 MG tablet Commonly known as:  XANAX Take 1 tablet (1 mg total) by mouth 3 (three) times daily as needed for anxiety.   cyclobenzaprine 10 MG tablet Commonly known as:  FLEXERIL Take 1 tablet (10 mg total) by mouth 3 (three) times daily as needed for muscle spasms.   fentaNYL 100 MCG/HR Commonly known as:  DURAGESIC - dosed mcg/hr Place 1 patch (100 mcg total) onto the skin every 3 (three) days. Start taking on:  10/22/2016   gabapentin 300 MG capsule Commonly known as:  NEURONTIN Take 1 capsule at HS x 3 days, then 2 capsules at HS x 3 days, and then 3 capsules at HS thereafter What changed:  how much to take  how to take this  when to take this  additional instructions   heparin flush (porcine) 100 UNIT/ML injection Flush port with 5cc of heparin lock flush.   HYDROmorphone 2 MG tablet Commonly known  as:  DILAUDID Take 1 tablet (2 mg total) by mouth every 4 (four) hours as needed for severe pain.   lidocaine-prilocaine cream Commonly known as:  EMLA Apply a quarter size amount to port site 1 hour prior to chemo. Do not rub in. Cover with plastic wrap. What changed:  how much to  take  how to take this  when to take this  reasons to take this  additional instructions   LORazepam 0.5 MG tablet Commonly known as:  ATIVAN Take 1 tablet (0.5 mg total) by mouth at bedtime as needed for anxiety or sleep.   metroNIDAZOLE 500 MG tablet Commonly known as:  FLAGYL Take 1 tablet (500 mg total) by mouth 3 (three) times daily. Take one pill at 1pm, 2pm, and 9pm today   naproxen sodium 220 MG tablet Commonly known as:  ANAPROX Take 440 mg by mouth 2 (two) times daily as needed (pain).   neomycin 500 MG tablet Commonly known as:  MYCIFRADIN Take 2 tablets (1,000 mg total) by mouth 3 (three) times daily. Take one pill at 1pm, 2pm, and 9pm today   Normal Saline Flush 0.9 % Soln Flush port with 10 ml of normal saline.   omeprazole 40 MG capsule Commonly known as:  PRILOSEC Take 1 capsule (40 mg total) by mouth daily.   ondansetron 4 MG disintegrating tablet Commonly known as:  ZOFRAN-ODT Take 1 tablet (4 mg total) by mouth every 6 (six) hours as needed for nausea.   ondansetron 4 MG tablet Commonly known as:  ZOFRAN Take 1 tablet (4 mg total) by mouth every 8 (eight) hours as needed for nausea or vomiting.   senna-docusate 8.6-50 MG tablet Commonly known as:  Senokot-S Take 2 tablets by mouth at bedtime. What changed:  when to take this  reasons to take this   zolpidem 10 MG tablet Commonly known as:  AMBIEN Take 1 tablet (10 mg total) by mouth at bedtime as needed for sleep.      Follow-up Information    Aviva Signs, MD. Schedule an appointment as soon as possible for a visit on 11/01/2016.   Specialty:  General Surgery Contact information: [id number] [address] [phone]           Signed: Ward Boissonneault 10/21/2016, 10:17 AM

## 2016-10-21 NOTE — Care Management Note (Signed)
Case Management Note  Patient Details  Name: Derek Rodriguez MRN: [id number] Date of Birth: 17-Jun-1961  Subjective/Objective:  Received call from Mary-Beth, Discharging RN to clarify that pts discharge disposition was Hospice and that all arrangements had been confirmed so that pt could be allowed to leave. Called Amy with Hospice agency to confirm referral [phone].     All arrangements are confirmed and pt can be discharged. Made Mary-Beth aware.               Action/Plan:CM will sign off for now but will be available should additional discharge needs arise or disposition change.    Expected Discharge Date:  10/21/16               Expected Discharge Plan:  Home w Hospice Care  In-House Referral:  NA  Discharge planning Services  CM Consult  Post Acute Care Choice:  Hospice Choice offered to:  Patient, Spouse  DME Arranged:    DME Agency:     HH Arranged:  RN [place] Agency:   ([place])  Status of Service:  Completed, signed off  If discussed at [employer], dates discussed:    Additional Comments:  Delrae Sawyers, RN 10/21/2016, 1:20 PM

## 2016-10-25 LAB — TYPE AND SCREEN
ABO/RH(D): A NEG
Antibody Screen: NEGATIVE
UNIT DIVISION: 0
Unit division: 0

## 2016-10-25 LAB — BPAM RBC
BLOOD PRODUCT EXPIRATION DATE: 201805212359
Blood Product Expiration Date: 201805212359
Unit Type and Rh: 600
Unit Type and Rh: 600

## 2016-11-01 ENCOUNTER — Ambulatory Visit (INDEPENDENT_AMBULATORY_CARE_PROVIDER_SITE_OTHER): Payer: Self-pay | Admitting: General Surgery

## 2016-11-01 ENCOUNTER — Encounter: Payer: Self-pay | Admitting: General Surgery

## 2016-11-01 VITALS — BP 127/86 | HR 120 | Temp 97.1°F | Resp 18 | Ht 72.0 in | Wt 198.0 lb

## 2016-11-01 DIAGNOSIS — Z09 Encounter for follow-up examination after completed treatment for conditions other than malignant neoplasm: Secondary | ICD-10-CM

## 2016-11-01 MED ORDER — HYDROMORPHONE HCL 2 MG PO TABS: 2.0000 mg | ORAL_TABLET | ORAL | 0 refills | Status: DC | PRN

## 2016-11-01 NOTE — Progress Notes (Signed)
Subjective:     Derek Rodriguez  Status post partial colectomy with colostomy, cholecystectomy for metastatic recurrent colon cancer. Patient still having significant incisional and lower abdominal pain. He is on Duragesic patch and Dilaudid pills for breakthrough pain. He describes no problems with the colostomy. He is eating. No nausea or vomiting have been noted. He is seeing [facility] tomorrow for possible palliative chemotherapy. Objective:    BP 127/86   Pulse (!) 120   Temp 97.1 F (36.2 C)   Resp 18   Ht 6' (1.829 m)   Wt 198 lb (89.8 kg)   BMI 26.85 kg/m   General:  alert, cooperative and fatigued  Abdomen soft, incision healing well. Colostomy pink and patent. Staples removed, Steri-Strips applied.     Assessment:    No complications from the surgery. Pain secondary to surgery and remaining tumor.    Plan:   Dilaudid has been reordered for breakthrough pain. He is seeing oncology tomorrow for further evaluation and treatment. Follow-up here as needed.

## 2016-11-02 ENCOUNTER — Encounter (HOSPITAL_COMMUNITY): Payer: Self-pay | Admitting: *Deleted

## 2016-11-02 ENCOUNTER — Encounter (HOSPITAL_COMMUNITY): Payer: BLUE CROSS/BLUE SHIELD | Attending: Oncology | Admitting: Oncology

## 2016-11-02 ENCOUNTER — Encounter (HOSPITAL_COMMUNITY): Payer: Self-pay

## 2016-11-02 VITALS — BP 111/78 | HR 114 | Temp 98.2°F | Resp 18 | Wt 196.6 lb

## 2016-11-02 DIAGNOSIS — C189 Malignant neoplasm of colon, unspecified: Secondary | ICD-10-CM

## 2016-11-02 DIAGNOSIS — C7989 Secondary malignant neoplasm of other specified sites: Secondary | ICD-10-CM

## 2016-11-02 DIAGNOSIS — R634 Abnormal weight loss: Secondary | ICD-10-CM

## 2016-11-02 DIAGNOSIS — C187 Malignant neoplasm of sigmoid colon: Secondary | ICD-10-CM

## 2016-11-02 DIAGNOSIS — H9202 Otalgia, left ear: Secondary | ICD-10-CM | POA: Insufficient documentation

## 2016-11-02 MED ORDER — AMOXICILLIN-POT CLAVULANATE 875-125 MG PO TABS: 1.0000 | ORAL_TABLET | Freq: Two times a day (BID) | ORAL | 0 refills | Status: DC

## 2016-11-02 NOTE — Progress Notes (Signed)
Derek Kaufmann, MD [place] [id number]   Malignant neoplasm of sigmoid colon [facility])  Primary colon cancer with invasion or adherence to other organ or structure (T4b) ([place])  CURRENT THERAPY: Surveillance per NCCN guidelines  INTERVAL HISTORY: Derek Rodriguez 56 y.o. 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 Arnoldo Morale.    Malignant neoplasm of sigmoid colon ([place])   09/09/2015 - 09/12/2015 Hospital Admission    Diverticulitis of colon with perforation      09/12/2015 Imaging    CT abd/pelvis- Continued severe inflammation of the proximal half of the sigmoid colon. There is a 2.6 cm oval low-attenuation structure extending between the posterior wall of the inflamed sigmoid colon and the dome of the bladder....      09/27/2015 Tumor Marker    CEA: 2.5       10/03/2015 Procedure    Partial colectomy, splenic flexure takedown by Dr. Arnoldo Morale      10/03/2015 Pathology Results    FULL THICKNESS INVASIVE MODERATELY TO POORLY DIFFERENTIATED ADENOCARCINOMA WITH SURFACE ULCERATION. TUMOR INVADES THROUGH MUSCULARIS MUCOSA AND PERFORATES SEROSAL SURFACE. - TUMOR MEASURES 10.3 CM IN GREATEST DIMENSION. - EXTENSIVE LYMPH/VASCULAR INVASI      10/13/2015 Pathology Results    MSI STABLE      10/20/2015 Imaging    CT chest- Negative. No evidence of metastatic disease or other active disease within the thorax.      11/04/2015 - 11/04/2015 Chemotherapy    CapeOX      11/12/2015 Miscellaneous    ED visit with nausea and decreased po intake      11/18/2015 Procedure    Port-A-Cath insertion by Dr. Arnoldo Morale      11/25/2015 Treatment Plan Change    From Xelox to FOLFOX      11/25/2015 - 05/04/2016 Chemotherapy    FOLFOX x 11 cycles (after 1 cycle of CAPEOX) with significant complications throughout treatment      01/06/2016 Treatment Plan Change    Oxaliplatin dose reduced by 20% for cycle #4 and  5FU bolus was discontinued.       04/05/2016 Imaging    CT abd/pelvis- Patient status post interval sigmoid colectomy. Anastomosis appears intact. Slightly anterior to the sigmoid colon is a small soft tissue nodule which may be postsurgical in etiology. Recommend attention on followup.  Interval decrease in size of preaortic lymph node.      09/25/2016 Imaging    CT abd/pelvis- 1. Distended gallbladder with thickened wall and pericholecystic strandiness worrisome for acute cholecystitis. Correlate clinically. 2. Enlarging soft tissue mass adjacent to the area of the anastomosis of the rectosigmoid colon worrisome for recurrence of tumor. She adjacent adenopathy with retroperitoneal adenopathy noted as well. 3. Higher attenuation within the superior aspect of the urinary bladder to the right of midline worrisome for tumor. 4. There are a few small soft tissue nodules in the peritoneal cavity worrisome for peritoneal spread of disease.      09/25/2016 -  Hospital Admission    Admit date: 09/25/2016 Admission diagnosis: Acute cholecystitis Additional comments: Imaging of abd/pelvis also reveals findings concerning for metastatic/recurrence of disease.  CEA is elevated too.      09/27/2016 Relapse/Recurrence    Last Treatment Date: 05/04/2016 Radiographic findings concerning for recurrence of disease.  CEA elevated.       10/12/2016 Procedure    Postop Diagnosis:  Same, unresectable colonic mass involving  left lower quadrant, obstructing colon and distal small bowel, extension into pelvic wall and probable left pelvic retroperitoneal space (frozen pelvis)   Procedure:  Partial colectomy with end colostomy (Hartman's procedure ), cholecystectomy, enteroenterostomy   (palliative procedures)      He is accompanied by his wife today. He saw Dr. Arnoldo Morale yesterday. He has not been eating much. He is not having much output from the colostomy. He states, "I feel like I get worse  everyday". He paced intermittently during our visit today. Weight loss of 26 lbs since 10/12/16.  The patient was referred to hospice by Dr. Arnoldo Morale and has been seen by a hospice nurse. The hospice nurse encouraged them to speak with an oncologist to learn more about his disease and make their decision on how to proceed.  Patient states, "It'd like to kill me" referring to previous Folfox treatment. He is apprehensive about trying chemotherapy again considering the negative side effects he experienced last time. Later in our visit, he states "I'm tired of all this" while discussing scheduling of scans.  Reports severe abdominal pain. He is unsure when this pain began. He was prescribed 2 mg Dilaudid to take every four hours prn by Dr. Arnoldo Morale yesterday.   Reports intermittent hearing loss and discomfort in his left ear which started recently. When he tilts his head to the right, it goes away for a short period. He also reports dizziness. Denies pain when pulling on his left ear lobe.  His port-a-cath was last flushed while he was in the hospital.    Review of Systems  Constitutional: Negative for chills, fever and weight loss.  HENT: Positive for hearing loss (intermittent left ear hearing loss).   Eyes: Negative.   Respiratory: Negative.  Negative for cough.   Cardiovascular: Negative.  Negative for chest pain.  Gastrointestinal: Positive for abdominal pain.  Genitourinary: Negative.   Musculoskeletal: Negative.   Skin: Negative.   Neurological: Positive for sensory change (tingling of fingertips) and weakness.  Endo/Heme/Allergies: Negative.   Psychiatric/Behavioral: Positive for depression. The patient has insomnia.     Past Medical History:  Diagnosis Date  . Adenocarcinoma of sigmoid colon ([place]) 10/18/2015  . Anxiety   . Arthritis   . Diverticulitis   . GERD (gastroesophageal reflux disease)   . Hypertension   . Neuropathy     Past Surgical History:  Procedure Laterality  Date  . BIOPSY  02/06/2016   Procedure: BIOPSY;  Surgeon: Daneil Dolin, MD;  [facility];  Service: Endoscopy;;  esophagus  . BIOPSY  10/09/2016   Procedure: BIOPSY;  Surgeon: Aviva Signs, MD;  [facility];  Service: Gastroenterology;;  sigmoid  . CHOLECYSTECTOMY N/A 10/12/2016   Procedure: CHOLECYSTECTOMY;  Surgeon: Aviva Signs, MD;  Location: [facility];  Service: General;  Laterality: N/A;  . COLECTOMY WITH COLOSTOMY CREATION/HARTMANN PROCEDURE N/A 10/12/2016   Procedure: COLECTOMY WITH COLOSTOMY CREATION/HARTMANN PROCEDURE (PALIATIVE) PARTIAL COLECTOMY WITH END OSTOMY CREATION SMALL BOWEL BYPASS;  Surgeon: Aviva Signs, MD;  Location: [facility];  Service: General;  Laterality: N/A;  . COLONOSCOPY N/A 10/09/2016   Procedure: COLONOSCOPY;  Surgeon: Aviva Signs, MD;  [facility];  Service: Gastroenterology;  Laterality: N/A;  . ESOPHAGEAL DILATION  02/06/2016   Procedure: ESOPHAGEAL DILATION;  Surgeon: Daneil Dolin, MD;  [facility];  Service: Endoscopy;;  . ESOPHAGOGASTRODUODENOSCOPY N/A 02/06/2016   Procedure: ESOPHAGOGASTRODUODENOSCOPY (EGD);  Surgeon: Daneil Dolin, MD;  [facility];  Service: Endoscopy;  Laterality: N/A;  115  . PARTIAL COLECTOMY N/A 10/03/2015   Procedure: PARTIAL COLECTOMY;  Surgeon: Aviva Signs, MD;  Location: [facility];  Service: General;  Laterality: N/A;  . PORTACATH PLACEMENT N/A 11/18/2015   Procedure: INSERTION PORT-A-CATH LEFT SUBCLAVIAN;  Surgeon: Aviva Signs, MD;  Location: [facility];  Service: General;  Laterality: N/A;    Family History  Problem Relation Age of Onset  . Diabetes Father     Social History   Social History  . Marital status: Married    Spouse name: N/A  . Number of children: N/A  . Years of education: N/A   Social History Main Topics  . Smoking status: Former Smoker    Types: 36, Pipe    Quit date: 09/26/1985  . Smokeless tobacco: Former Systems developer    Types: Snuff    Quit date:  06/29/2015     Comment: 2-3 times weekly  . Alcohol use No     Comment: occ  . Drug use: No  . Sexual activity: Yes    Birth control/ protection: None   Other Topics Concern  . None   Social History Narrative  . None     PHYSICAL EXAMINATION  ECOG PERFORMANCE STATUS: 1 - Symptomatic but completely ambulatory  Vitals:   11/02/16 1139  BP: 111/78  Pulse: (!) 114  Resp: 18  Temp: 98.2 F (36.8 C)    Physical Exam  Constitutional: He is oriented to person, place, and time and well-developed, well-nourished, and in no distress.  HENT:  Head: Normocephalic and atraumatic.  Left Ear: External ear normal.  Mouth/Throat: Oropharynx is clear and moist.  No left ear cerumen impaction, tympanic membrane clear and non erythematous  Eyes: Conjunctivae and EOM are normal. Pupils are equal, round, and reactive to light.  Neck: Normal range of motion. Neck supple.  Cardiovascular: Normal rate, regular rhythm and normal heart sounds.   Pulmonary/Chest: Effort normal and breath sounds normal.  Port in place  Abdominal: Soft. Bowel sounds are normal. There is tenderness.  Musculoskeletal: Normal range of motion.  Neurological: He is alert and oriented to person, place, and time. Gait normal.  Skin: Skin is warm and dry.  Nursing note and vitals reviewed.   LABORATORY DATA: I have reviewed the data as listed. CBC    Component Value Date/Time   WBC 5.7 10/16/2016 0518   RBC 4.10 (L) 10/16/2016 0518   HGB 12.2 (L) 10/16/2016 0518   HCT 35.4 (L) 10/16/2016 0518   PLT 265 10/16/2016 0518   MCV 86.3 10/16/2016 0518   MCH 29.8 10/16/2016 0518   MCHC 34.5 10/16/2016 0518   RDW 13.5 10/16/2016 0518   LYMPHSABS 1.1 10/11/2016 1052   MONOABS 0.6 10/11/2016 1052   EOSABS 0.2 10/11/2016 1052   BASOSABS 0.0 10/11/2016 1052      Chemistry      Component Value Date/Time   NA 136 10/16/2016 0518   K 3.7 10/16/2016 0518   CL 100 (L) 10/16/2016 0518   CO2 26 10/16/2016 0518   BUN 21  (H) 10/16/2016 0518   CREATININE 1.00 10/16/2016 0518      Component Value Date/Time   CALCIUM 8.3 (L) 10/16/2016 0518   ALKPHOS 57 10/13/2016 0611   AST 53 (H) 10/13/2016 0611   ALT 31 10/13/2016 0611   BILITOT 1.4 (H) 10/13/2016 0611     Lab Results  Component Value Date   CEA 10.2 (H) 10/11/2016   PATHOLOGY: Surgical Path 10/12/16 1. Gallbladder METASTATIC ADENOCARCINOMA  INVOLVING GALLBLADDER WALL 2. 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 S/P partial colectomy by Dr. Arnoldo Morale on 10/03/2015. Followed by 6 months of adjuvant chemotherapy initially treatment with CAPEOX x 1 cycle followed by FOLFOX x 11 cycles with last treatment on 05/04/2016 (requiring significant dose reductions and complicated by significant intolerance/depression).  Throughout treatment discussion regarding de-bulking surgery and HIPEC at [facility] took place repeatedly, but the patient declined ("I am not having any more surgery).   Patient presented in April 2018 with abdominal pain. CT scan of abd/pelvis demonstrated acute cholecystitis as well as recurrence of his cancer at the anastomosis site, abd/pelvic lymphadenopathy, and likely peritoneal spread of his malignancy with soft tissue nodules seen in the peritoneal cavity. Status post partial colectomy with colostomy, cholecystectomy  for metastatic recurrent colon cancer  PLAN: Patient was enrolled in hospice by Dr Arnoldo Morale, however the hospice RN was questioning whether the patient was a hospice candidate and said that patient should get clarification. I have discussed in detail with the patient that he now has stage IVC disease (T4b N2 M1c) based on his recent surgical path report and CT abd/pelvis from 09/2016. He has metastasis of his colon cancer to his gallbladder.   We discussed the patient's prognosis at length and options for treatment. He understands that treatment would not be curable and would only be palliative in nautre. The patient is apprehensive about trying chemotherapy again considering the negative side effects he experienced last time with Folfox.  Weight loss of 26 lbs since 10/12/16. He is not eating well and has constant pain at his surgical site. I am concerned that patient will not be able to tolerate palliative chemo and I have voiced this to the patient and his wife.   Patient was very indecisive during this visit regarding whether he would like to pursue treatment vs. Continue with comfort measures and hospice. I have discussed the side effects and scheduling of FOLFIRI in detail with the patient and his wife and I have provided them with reading material. I have told them to think it over and let me know next week what they decide.   I have prescribed augmentin for possible left ear infection based on current symptoms.  I have told him he can take his dilaudid PRN every 2 hours for pain control.  He will return in one week for follow up and further discussion of how he would like to proceed.  The patient will contact our office if he decides to move forward with chemotherapy and we will order staging scans to establish a baseline. This will include a CT Chest and repeat CT Abdomen Pelvis for evaluation of his RLQ pain.   RTC in 1 week. Labs on next visit.  ORDERS PLACED FOR THIS ENCOUNTER: Orders  Placed This Encounter  Procedures  . CBC with Differential  . Comprehensive metabolic panel  . CEA    THERAPY PLAN:  NCCN guidelines for surveillance for Colon cancer are as follows (1.2017):  A. Stage I   1. Colonoscopy at year 1    A. If advanced adenoma, repeat in 1 year    B. If no advanced adenoma, repeat in 3 years, and then every 5 years.  B. Stage II, Stage III   1. H+P every 3-6 months x 2 years and then every 6 months for a total of 5 years    2. CEA every 3-6 months x 2 years and then every 6 months for a total of 5 years    3. CT CAP every 6-12 months (category 2B for frequency < 12 months) for a total of 5 years .   4.  Colonoscopy in 1 year except if no preoperative colonoscopy due to obstructing lesion, colonoscopy in 3-6 months.     A. If advanced adenoma, repeat in 1 year    B. If no advanced adenoma, repeat in 3 years, then every 5 years   5. PET/CT scan is not recommended.  C. Stage IV   1. H+P every 3-6 months x 2 years and then every 6 months for a total of 5 years    2. CEA every 3 months x 2 years and then every 6 months for a total of 3- 5 years    3. CT CAP every 3-6 months (category 2B for frequency < 6 months) x 2 years., then every 6-12 months for a total of 5 years .   4. Colonoscopy in 1 year except if no preoperative colonoscopy due to obstructing lesion, colonoscopy in 3-6 months.     A. If advanced adenoma, repeat in 1 year    B. If no advanced adenoma, repeat in 3 years, then every 5 years   All questions were answered. The patient knows to call the clinic with any problems, questions or concerns. We can certainly see the patient much sooner if necessary.  This document serves as a record of services personally performed by Twana First, MD. It was created on her behalf by Arlyce Harman, a trained medical scribe. The creation of this record is based on the scribe's personal observations and the provider's statements to them. This document has been  checked and approved by the attending provider.  This note is electronically signed by: Carlis Abbott 11/02/2016 11:53 AM

## 2016-11-02 NOTE — Patient Instructions (Signed)
[facility] at [facility] Discharge Instructions  RECOMMENDATIONS MADE BY THE CONSULTANT AND ANY TEST RESULTS WILL BE SENT TO YOUR REFERRING PHYSICIAN.  You were seen today by Dr. Louise Zhou    Thank you for choosing [facility] at [facility] to provide your oncology and hematology care.  To afford each patient quality time with our provider, please arrive at least 15 minutes before your scheduled appointment time.    If you have a lab appointment with the Cancer Center please come in thru the  Main Entrance and check in at the main information desk  You need to re-schedule your appointment should you arrive 10 or more minutes late.  We strive to give you quality time with our providers, and arriving late affects you and other patients whose appointments are after yours.  Also, if you no show three or more times for appointments you may be dismissed from the clinic at the providers discretion.     Again, thank you for choosing [facility].  Our hope is that these requests will decrease the amount of time that you wait before being seen by our physicians.       _____________________________________________________________  Should you have questions after your visit to [facility], please contact our office at [phone] between the hours of 8:30 a.m. and 4:30 p.m.  Voicemails left after 4:30 p.m. will not be returned until the following business day.  For prescription refill requests, have your pharmacy contact our office.       Resources For Cancer Patients and their Caregivers ? American Cancer Society: Can assist with transportation, wigs, general needs, runs Look Good Feel Better.        1-888-227-6333 ? Cancer Care: Provides financial assistance, online support groups, medication/co-pay assistance.  1-800-813-HOPE (4673) ? [facility] Assists [place] cancer patients and their  families through emotional , educational and financial support.  336-427-4357 ? [place] DSS Where to apply for food stamps, Medicaid and utility assistance. 336-342-1394 ? [facility]: Transportation to medical appointments. 336-347-2287 ? Social Security Administration: May apply for disability if have a Stage IV cancer. 336-342-7796 1-800-772-1213 ? [place] Aging, Disability and Transit Services: Assists with nutrition, care and transit needs. 336-349-2343  Cancer Center Support Programs: @10RELATIVEDAYS@ > Cancer Support Group  2nd Tuesday of the month 1pm-2pm, Journey Room  > Creative Journey  3rd Tuesday of the month 1130am-1pm, Journey Room  > Look Good Feel Better  1st Wednesday of the month 10am-12 noon, Journey Room (Call American Cancer Society to register 1-800-395-5775)    

## 2016-11-08 ENCOUNTER — Other Ambulatory Visit (HOSPITAL_COMMUNITY): Payer: Self-pay | Admitting: Emergency Medicine

## 2016-11-08 ENCOUNTER — Telehealth (HOSPITAL_COMMUNITY): Payer: Self-pay | Admitting: Emergency Medicine

## 2016-11-08 ENCOUNTER — Other Ambulatory Visit (HOSPITAL_COMMUNITY): Payer: Self-pay | Admitting: Oncology

## 2016-11-08 DIAGNOSIS — C189 Malignant neoplasm of colon, unspecified: Secondary | ICD-10-CM

## 2016-11-08 MED ORDER — ALPRAZOLAM 1 MG PO TABS: 1.0000 mg | ORAL_TABLET | Freq: Three times a day (TID) | ORAL | 0 refills | Status: DC | PRN

## 2016-11-08 NOTE — Telephone Encounter (Signed)
pts wife called and was still very unsure about whether they wanted to do chemotherapy or not.  She states that the pt is still very weak.  Spoke with Dr Talbert Cage, she wants to get CT scans completed to see what is going on then have them come back in for a follow up appt.  Dr Talbert Cage said that if pt does not get away better and the scans are worse that she will still recommend hospice.  Pt was scheduled for CT scans on 5/29 but wife can not do it that day bc she has other appt.  She is going to re-schedule it for another day.

## 2016-11-09 ENCOUNTER — Ambulatory Visit (HOSPITAL_COMMUNITY): Payer: BLUE CROSS/BLUE SHIELD

## 2016-11-09 ENCOUNTER — Telehealth (HOSPITAL_COMMUNITY): Payer: Self-pay | Admitting: *Deleted

## 2016-11-09 ENCOUNTER — Other Ambulatory Visit (HOSPITAL_COMMUNITY): Payer: BLUE CROSS/BLUE SHIELD

## 2016-11-13 ENCOUNTER — Ambulatory Visit (HOSPITAL_COMMUNITY): Payer: BLUE CROSS/BLUE SHIELD

## 2016-11-15 ENCOUNTER — Other Ambulatory Visit (HOSPITAL_COMMUNITY): Payer: BLUE CROSS/BLUE SHIELD

## 2016-11-15 ENCOUNTER — Ambulatory Visit (HOSPITAL_COMMUNITY): Payer: BLUE CROSS/BLUE SHIELD

## 2016-11-20 ENCOUNTER — Other Ambulatory Visit (HOSPITAL_COMMUNITY): Payer: Self-pay

## 2016-11-20 MED ORDER — HYDROMORPHONE HCL 2 MG PO TABS: 2.0000 mg | ORAL_TABLET | ORAL | 0 refills | Status: DC | PRN

## 2016-11-20 MED ORDER — FENTANYL 100 MCG/HR TD PT72: 100.0000 ug | MEDICATED_PATCH | TRANSDERMAL | 0 refills | Status: AC

## 2016-11-20 MED ORDER — ALPRAZOLAM 1 MG PO TABS: 1.0000 mg | ORAL_TABLET | Freq: Three times a day (TID) | ORAL | 0 refills | Status: DC | PRN

## 2016-11-21 ENCOUNTER — Encounter (HOSPITAL_COMMUNITY): Payer: Self-pay

## 2016-11-22 ENCOUNTER — Telehealth (HOSPITAL_COMMUNITY): Payer: Self-pay | Admitting: *Deleted

## 2016-11-23 ENCOUNTER — Ambulatory Visit (HOSPITAL_COMMUNITY)
Admission: RE | Admit: 2016-11-23 | Discharge: 2016-11-23 | Disposition: A | Discharge status: Home / Self Care | Payer: BLUE CROSS/BLUE SHIELD | Source: Ambulatory Visit | Attending: Oncology | Admitting: Oncology

## 2016-11-23 ENCOUNTER — Other Ambulatory Visit: Payer: Self-pay

## 2016-11-23 ENCOUNTER — Encounter (HOSPITAL_COMMUNITY): Payer: Self-pay | Admitting: *Deleted

## 2016-11-23 ENCOUNTER — Inpatient Hospital Stay (HOSPITAL_COMMUNITY)
Admission: EM | Admit: 2016-11-23 | Discharge: 2016-11-25 | DRG: 176 | Disposition: A | Discharge status: Home Health | Payer: BLUE CROSS/BLUE SHIELD | Attending: Family Medicine | Admitting: Family Medicine

## 2016-11-23 ENCOUNTER — Telehealth (HOSPITAL_COMMUNITY): Payer: Self-pay | Admitting: Oncology

## 2016-11-23 DIAGNOSIS — E440 Moderate protein-calorie malnutrition: Secondary | ICD-10-CM | POA: Diagnosis present

## 2016-11-23 DIAGNOSIS — Z6823 Body mass index (BMI) 23.0-23.9, adult: Secondary | ICD-10-CM | POA: Diagnosis not present

## 2016-11-23 DIAGNOSIS — G893 Neoplasm related pain (acute) (chronic): Secondary | ICD-10-CM | POA: Diagnosis present

## 2016-11-23 DIAGNOSIS — K219 Gastro-esophageal reflux disease without esophagitis: Secondary | ICD-10-CM | POA: Diagnosis present

## 2016-11-23 DIAGNOSIS — M1990 Unspecified osteoarthritis, unspecified site: Secondary | ICD-10-CM | POA: Diagnosis present

## 2016-11-23 DIAGNOSIS — F32A Depression, unspecified: Secondary | ICD-10-CM | POA: Diagnosis present

## 2016-11-23 DIAGNOSIS — C187 Malignant neoplasm of sigmoid colon: Secondary | ICD-10-CM | POA: Diagnosis present

## 2016-11-23 DIAGNOSIS — I2699 Other pulmonary embolism without acute cor pulmonale: Secondary | ICD-10-CM | POA: Diagnosis present

## 2016-11-23 DIAGNOSIS — Z9049 Acquired absence of other specified parts of digestive tract: Secondary | ICD-10-CM

## 2016-11-23 DIAGNOSIS — Z933 Colostomy status: Secondary | ICD-10-CM | POA: Diagnosis not present

## 2016-11-23 DIAGNOSIS — Z79891 Long term (current) use of opiate analgesic: Secondary | ICD-10-CM | POA: Diagnosis not present

## 2016-11-23 DIAGNOSIS — G629 Polyneuropathy, unspecified: Secondary | ICD-10-CM | POA: Diagnosis present

## 2016-11-23 DIAGNOSIS — N400 Enlarged prostate without lower urinary tract symptoms: Secondary | ICD-10-CM | POA: Diagnosis present

## 2016-11-23 DIAGNOSIS — F329 Major depressive disorder, single episode, unspecified: Secondary | ICD-10-CM | POA: Diagnosis present

## 2016-11-23 DIAGNOSIS — F419 Anxiety disorder, unspecified: Secondary | ICD-10-CM | POA: Diagnosis present

## 2016-11-23 DIAGNOSIS — C189 Malignant neoplasm of colon, unspecified: Secondary | ICD-10-CM

## 2016-11-23 DIAGNOSIS — Z87891 Personal history of nicotine dependence: Secondary | ICD-10-CM | POA: Diagnosis not present

## 2016-11-23 DIAGNOSIS — Z79899 Other long term (current) drug therapy: Secondary | ICD-10-CM

## 2016-11-23 DIAGNOSIS — C799 Secondary malignant neoplasm of unspecified site: Secondary | ICD-10-CM | POA: Diagnosis not present

## 2016-11-23 DIAGNOSIS — I10 Essential (primary) hypertension: Secondary | ICD-10-CM | POA: Diagnosis present

## 2016-11-23 DIAGNOSIS — R9431 Abnormal electrocardiogram [ECG] [EKG]: Secondary | ICD-10-CM | POA: Diagnosis not present

## 2016-11-23 LAB — PROTIME-INR
INR: 1.1
PROTHROMBIN TIME: 14.2 s (ref 11.4–15.2)

## 2016-11-23 LAB — URINALYSIS, ROUTINE W REFLEX MICROSCOPIC
BILIRUBIN URINE: NEGATIVE
GLUCOSE, UA: NEGATIVE mg/dL
KETONES UR: NEGATIVE mg/dL
LEUKOCYTES UA: NEGATIVE
Nitrite: NEGATIVE
PH: 5 (ref 5.0–8.0)
PROTEIN: 100 mg/dL — AB
Specific Gravity, Urine: >1.046 — ABNORMAL HIGH (ref 1.005–1.030)

## 2016-11-23 LAB — CBC WITH DIFFERENTIAL/PLATELET
BASOS PCT: 1 %
Basophils Absolute: 0 10*3/uL (ref 0.0–0.1)
Eosinophils Absolute: 0.2 10*3/uL (ref 0.0–0.7)
Eosinophils Relative: 2 %
HEMATOCRIT: 38.2 % — AB (ref 39.0–52.0)
Hemoglobin: 12.5 g/dL — ABNORMAL LOW (ref 13.0–17.0)
LYMPHS PCT: 16 %
Lymphs Abs: 1.3 10*3/uL (ref 0.7–4.0)
MCH: 26.3 pg (ref 26.0–34.0)
MCHC: 32.7 g/dL (ref 30.0–36.0)
MCV: 80.3 fL (ref 78.0–100.0)
MONO ABS: 0.9 10*3/uL (ref 0.1–1.0)
MONOS PCT: 11 %
NEUTROS ABS: 5.7 10*3/uL (ref 1.7–7.7)
NEUTROS PCT: 70 %
Platelets: 353 10*3/uL (ref 150–400)
RBC: 4.76 MIL/uL (ref 4.22–5.81)
RDW: 14.5 % (ref 11.5–15.5)
WBC: 8.1 10*3/uL (ref 4.0–10.5)

## 2016-11-23 LAB — BASIC METABOLIC PANEL
ANION GAP: 11 (ref 5–15)
BUN: 14 mg/dL (ref 6–20)
CALCIUM: 9.4 mg/dL (ref 8.9–10.3)
CHLORIDE: 94 mmol/L — AB (ref 101–111)
CO2: 28 mmol/L (ref 22–32)
Creatinine, Ser: 1.1 mg/dL (ref 0.61–1.24)
GFR calc non Af Amer: >60 mL/min (ref >60)
GLUCOSE: 111 mg/dL — AB (ref 65–99)
POTASSIUM: 4.5 mmol/L (ref 3.5–5.1)
Sodium: 133 mmol/L — ABNORMAL LOW (ref 135–145)

## 2016-11-23 LAB — TROPONIN I: Troponin I: <0.03 ng/mL (ref <0.03)

## 2016-11-23 LAB — I-STAT TROPONIN, ED: TROPONIN I, POC: 0 ng/mL (ref 0.00–0.08)

## 2016-11-23 MED ORDER — HEPARIN BOLUS VIA INFUSION
4000.0000 [IU] | Freq: Once | INTRAVENOUS | Status: AC
  Administered 2016-11-23: 4000 [IU] via INTRAVENOUS

## 2016-11-23 MED ORDER — IOPAMIDOL (ISOVUE-300) INJECTION 61%
100.0000 mL | Freq: Once | INTRAVENOUS | Status: AC | PRN
  Administered 2016-11-23: 100 mL via INTRAVENOUS

## 2016-11-23 MED ORDER — HEPARIN (PORCINE) IN NACL 100-0.45 UNIT/ML-% IJ SOLN
1500.0000 [IU]/h | INTRAMUSCULAR | Status: DC
  Administered 2016-11-23: 1400 [IU]/h via INTRAVENOUS
  Administered 2016-11-24 – 2016-11-25 (×2): 1500 [IU]/h via INTRAVENOUS
  Filled 2016-11-23 (×3): qty 250

## 2016-11-23 NOTE — Telephone Encounter (Signed)
On-call for [facility] tonight.  Radiology paged me to report Derek Rodriguez CT report.  He has a PE with evidence of right heart strain.  Additionally, he has progression of metastatic disease.    Given evidence of R heart strain, I have called the patient.    I have called patient without answer:  1920 hours  1921 hours  1923 hours  VM left at number provided on demographics at 1928 hours.  Called again at 1934 hours, 1940 hours.1945 hrs  I was able to get in touch with Derek Rodriguez, his spouse at the 1945 hrs call.  She is advised to report to the ED with [name].  She is advised of his PE with suspicion for right heart strain.    I have discussed the patient's case with ED, Dr. Thurnell Garbe.    Patient will report to ED accordingly.  Robynn Pane, PA-C 11/23/2016 7:57 PM

## 2016-11-23 NOTE — ED Triage Notes (Signed)
Sent here for evaluation after being diagnosed with blood clot in lungs

## 2016-11-23 NOTE — ED Provider Notes (Signed)
[facility] DEPT Provider Note   CSN: [id number] Arrival date & time: 11/23/16  2029     History   Chief Complaint No chief complaint on file.   HPI Derek Rodriguez is a 56 y.o. male.  HPI  Pt was seen at 2040. Per pt and his wife, c/o unknown onset and persistence of constant "blood clot in my lungs" that was found on CT today. Pt states he had f/u CT chest/abd/pelvis today for hx metastatic colon CA. Pt was called PTA and told he "had a blood clot in my lungs" and to come to the hospital for evaluation/admission. Denies CP/SOB, no back pain, no fevers, no rash.    Past Medical History:  Diagnosis Date  . 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 Anxiety   . Arthritis   . Diverticulitis   . GERD (gastroesophageal reflux disease)   . Hypertension   . Neuropathy     Patient Active Problem List   Diagnosis Date Noted  . Ear pain, left 11/02/2016  . Primary colon cancer with invasion or adherence to other organ or structure (T4b) ([place]) 10/12/2016  . Palliative care encounter   . Goals of care, counseling/discussion   . Calculus of gallbladder with acute cholecystitis without obstruction 09/25/2016  . Hypokalemia 02/17/2016  . Fatigue 02/17/2016  . Anemia 02/17/2016  . Esophageal dysphagia 02/06/2016  . Dehydration 12/30/2015  . Depression 12/15/2015  . Insomnia 11/15/2015  . Malignant neoplasm of sigmoid colon ([place]) 10/18/2015  . S/P partial colectomy 10/03/2015  . Protein-calorie malnutrition, severe 09/10/2015  . Acute diverticulitis 09/09/2015  . Diverticulitis of colon with perforation 09/09/2015  . Colonic diverticular abscess 09/09/2015    Past Surgical History:  Procedure Laterality Date  . BIOPSY  02/06/2016   Procedure: BIOPSY;  Surgeon: Daneil Dolin, MD;  [facility];  Service: Endoscopy;;  esophagus  . BIOPSY  10/09/2016   Procedure: BIOPSY;  Surgeon: Aviva Signs, MD;  Location: [facility];  Service: Gastroenterology;;   sigmoid  . CHOLECYSTECTOMY N/A 10/12/2016   Procedure: CHOLECYSTECTOMY;  Surgeon: Aviva Signs, MD;  Location: [facility];  Service: General;  Laterality: N/A;  . COLECTOMY WITH COLOSTOMY CREATION/HARTMANN PROCEDURE N/A 10/12/2016   Procedure: COLECTOMY WITH COLOSTOMY CREATION/HARTMANN PROCEDURE (PALIATIVE) PARTIAL COLECTOMY WITH END OSTOMY CREATION SMALL BOWEL BYPASS;  Surgeon: Aviva Signs, MD;  Location: [facility];  Service: General;  Laterality: N/A;  . COLONOSCOPY N/A 10/09/2016   Procedure: COLONOSCOPY;  Surgeon: Aviva Signs, MD;  [facility];  Service: Gastroenterology;  Laterality: N/A;  . ESOPHAGEAL DILATION  02/06/2016   Procedure: ESOPHAGEAL DILATION;  Surgeon: Daneil Dolin, MD;  [facility]: [facility];  Service: Endoscopy;;  . ESOPHAGOGASTRODUODENOSCOPY N/A 02/06/2016   Procedure: ESOPHAGOGASTRODUODENOSCOPY (EGD);  Surgeon: Daneil Dolin, MD;  [facility];  Service: Endoscopy;  Laterality: N/A;  115  . PARTIAL COLECTOMY N/A 10/03/2015   Procedure: PARTIAL COLECTOMY;  Surgeon: Aviva Signs, MD;  Location: [facility];  Service: General;  Laterality: N/A;  . PORTACATH PLACEMENT N/A 11/18/2015   Procedure: INSERTION PORT-A-CATH LEFT SUBCLAVIAN;  Surgeon: Aviva Signs, MD;  Location: [facility];  Service: General;  Laterality: N/A;       Home Medications    Prior to Admission medications   Medication Sig Start Date End Date Taking? Authorizing Provider  ALPRAZolam Duanne Moron) 1 MG tablet Take 1 tablet (1 mg total) by mouth 3 (three) times daily as needed for anxiety. 11/20/16   Baird Cancer,  PA-C  amoxicillin-clavulanate (AUGMENTIN) 875-125 MG tablet Take 1 tablet by mouth 2 (two) times daily. 11/02/16   Twana First, MD  cyclobenzaprine (FLEXERIL) 10 MG tablet Take 1 tablet (10 mg total) by mouth 3 (three) times daily as needed for muscle spasms. 10/21/16   Aviva Signs, MD  fentaNYL (DURAGESIC - DOSED MCG/HR) 100 MCG/HR Place 1 patch (100 mcg total) onto the skin every 3  (three) days. 11/20/16   Baird Cancer, PA-C  gabapentin (NEURONTIN) 300 MG capsule Take 1 capsule at HS x 3 days, then 2 capsules at HS x 3 days, and then 3 capsules at HS thereafter Patient taking differently: Take 900 mg by mouth at bedtime.  09/14/16   Baird Cancer, PA-C  HYDROmorphone (DILAUDID) 2 MG tablet Take 1 tablet (2 mg total) by mouth every 4 (four) hours as needed for severe pain. 11/20/16   Baird Cancer, PA-C  lidocaine-prilocaine (EMLA) cream Apply a quarter size amount to port site 1 hour prior to chemo. Do not rub in. Cover with plastic wrap. Patient not taking: Reported on 11/02/2016 11/15/15   Patrici Ranks, MD  omeprazole (PRILOSEC) 40 MG capsule Take 1 capsule (40 mg total) by mouth daily. 06/08/16   Baird Cancer, PA-C  ondansetron (ZOFRAN) 4 MG tablet Take 1 tablet (4 mg total) by mouth every 8 (eight) hours as needed for nausea or vomiting. 09/27/16   Hosie Poisson, MD  ondansetron (ZOFRAN-ODT) 4 MG disintegrating tablet Take 1 tablet (4 mg total) by mouth every 6 (six) hours as needed for nausea. 10/21/16   Aviva Signs, MD  oxyCODONE-acetaminophen (PERCOCET) 7.5-325 MG tablet Take 1 tablet by mouth every 6 (six) hours as needed. 10/04/16   [provider]  senna-docusate (SENOKOT-S) 8.6-50 MG tablet Take 2 tablets by mouth at bedtime. Patient taking differently: Take 2 tablets by mouth at bedtime as needed (for constipation).  09/27/16   Hosie Poisson, MD  zolpidem (AMBIEN) 10 MG tablet Take 1 tablet (10 mg total) by mouth at bedtime as needed for sleep. 06/08/16   Baird Cancer, PA-C    Family History Family History  Problem Relation Age of Onset  . Diabetes Father     Social History Social History  Substance Use Topics  . Smoking status: Former Smoker    Types: 89, Pipe    Quit date: 09/26/1985  . Smokeless tobacco: Former Systems developer    Types: Snuff    Quit date: 06/29/2015     Comment: 2-3 times weekly  . Alcohol use No     Comment:  occ     Allergies   Patient has no known allergies.   Review of Systems Review of Systems ROS: Statement: All systems negative except as marked or noted in the HPI; Constitutional: Negative for fever and chills. ; ; Eyes: Negative for eye pain, redness and discharge. ; ; ENMT: Negative for ear pain, hoarseness, nasal congestion, sinus pressure and sore throat. ; ; Cardiovascular: Negative for chest pain, palpitations, diaphoresis, dyspnea and peripheral edema. ; ; Respiratory: Negative for cough, wheezing and stridor. ; ; Gastrointestinal: Negative for nausea, vomiting, diarrhea, abdominal pain, blood in stool, hematemesis, jaundice and rectal bleeding. . ; ; Genitourinary: Negative for dysuria, flank pain and hematuria. ; ; Musculoskeletal: Negative for back pain and neck pain. Negative for swelling and trauma.; ; Skin: Negative for pruritus, rash, abrasions, blisters, bruising and skin lesion.; ; Neuro: Negative for headache, lightheadedness and neck stiffness. Negative for weakness, altered level  of consciousness, altered mental status, extremity weakness, paresthesias, involuntary movement, seizure and syncope.     Physical Exam Updated Vital Signs BP 125/86   Pulse 96   Temp 97.9 F (36.6 C) (Oral)   Resp 20   Wt 86.2 kg (190 lb)   SpO2 96%   BMI 25.77 kg/m   Physical Exam 2045: Physical examination:  Nursing notes reviewed; Vital signs and O2 SAT reviewed;  Constitutional: Well developed, Well nourished, Well hydrated, In no acute distress; Head:  Normocephalic, atraumatic; Eyes: EOMI, PERRL, No scleral icterus; ENMT: Mouth and pharynx normal, Mucous membranes moist; Neck: Supple, Full range of motion, No lymphadenopathy; Cardiovascular: Regular rate and rhythm, No gallop; Respiratory: Breath sounds clear & equal bilaterally, No wheezes.  Speaking full sentences with ease, Normal respiratory effort/excursion; Chest: Nontender, Movement normal; Abdomen: Soft, Nontender, Nondistended,  Normal bowel sounds; Genitourinary: No CVA tenderness; Extremities: Pulses normal, No tenderness, No edema, No calf edema or asymmetry.; Neuro: AA&Ox3, Major CN grossly intact.  Speech clear. No gross focal motor or sensory deficits in extremities.; Skin: Color normal, Warm, Dry.   ED Treatments / Results  Labs (all labs ordered are listed, but only abnormal results are displayed)   EKG  EKG Interpretation  Date/Time:  Friday November 23 2016 21:08:11 EDT Ventricular Rate:  99 PR Interval:    QRS Duration: 88 QT Interval:  355 QTC Calculation: 456 R Axis:   0 Text Interpretation:  Sinus rhythm Baseline wander in lead(s) V2 When compared with ECG of 09/25/2016 No significant change was found Confirmed by [facility]  MD, Nunzio Cory [id number]) on 11/23/2016 9:22:34 PM       Radiology   Procedures Procedures (including critical care time)  Medications Ordered in ED Medications - No data to display   Initial Impression / Assessment and Plan / ED Course  I have reviewed the triage vital signs and the nursing notes.  Pertinent labs & imaging results that were available during my care of the patient were reviewed by me and considered in my medical decision making (see chart for details).  MDM Reviewed: previous chart, nursing note and vitals Reviewed previous: labs and ECG Interpretation: labs, ECG and CT scan Total time providing critical care: 30-74 minutes. This excludes time spent performing separately reportable procedures and services. Consults: admitting MD   CRITICAL CARE Performed by: Alfonzo Feller Total critical care time: 35 minutes Critical care time was exclusive of separately billable procedures and treating other patients. Critical care was necessary to treat or prevent imminent or life-threatening deterioration. Critical care was time spent personally by me on the following activities: development of treatment plan with patient and/or surrogate as well as nursing,  discussions with consultants, evaluation of patient's response to treatment, examination of patient, obtaining history from patient or surrogate, ordering and performing treatments and interventions, ordering and review of laboratory studies, ordering and review of radiographic studies, pulse oximetry and re-evaluation of patient's condition.    Results for orders placed or performed during the hospital encounter of 79/89/21  Basic metabolic panel  Result Value Ref Range   Sodium 133 (L) 135 - 145 mmol/L   Potassium 4.5 3.5 - 5.1 mmol/L   Chloride 94 (L) 101 - 111 mmol/L   CO2 28 22 - 32 mmol/L   Glucose, Bld 111 (H) 65 - 99 mg/dL   BUN 14 6 - 20 mg/dL   Creatinine, Ser 1.10 0.61 - 1.24 mg/dL   Calcium 9.4 8.9 - 10.3 mg/dL   GFR calc  non Af Amer >60 >60 mL/min   GFR calc Af Amer >60 >60 mL/min   Anion gap 11 5 - 15  CBC with Differential  Result Value Ref Range   WBC 8.1 4.0 - 10.5 K/uL   RBC 4.76 4.22 - 5.81 MIL/uL   Hemoglobin 12.5 (L) 13.0 - 17.0 g/dL   HCT 38.2 (L) 39.0 - 52.0 %   MCV 80.3 78.0 - 100.0 fL   MCH 26.3 26.0 - 34.0 pg   MCHC 32.7 30.0 - 36.0 g/dL   RDW 14.5 11.5 - 15.5 %   Platelets 353 150 - 400 K/uL   Neutrophils Relative % 70 %   Neutro Abs 5.7 1.7 - 7.7 K/uL   Lymphocytes Relative 16 %   Lymphs Abs 1.3 0.7 - 4.0 K/uL   Monocytes Relative 11 %   Monocytes Absolute 0.9 0.1 - 1.0 K/uL   Eosinophils Relative 2 %   Eosinophils Absolute 0.2 0.0 - 0.7 K/uL   Basophils Relative 1 %   Basophils Absolute 0.0 0.0 - 0.1 K/uL  Protime-INR  Result Value Ref Range   Prothrombin Time 14.2 11.4 - 15.2 seconds   INR 1.10   Troponin I  Result Value Ref Range   Troponin I <0.03 <0.03 ng/mL  I-stat troponin, ED  Result Value Ref Range   Troponin i, poc 0.00 0.00 - 0.08 ng/mL   Comment 3             Ct Chest W Contrast Addendum Date: 11/23/2016   ADDENDUM REPORT: 11/23/2016 19:44 ADDENDUM: Critical Value/emergent results were called by telephone on 11/23/2016 at 7:15  pm to [facility], who verbally acknowledged these results. Electronically Signed   By: Margarette Canada M.D.   On: 11/23/2016 19:44  Result Date: 11/23/2016 CLINICAL DATA:  56 year old male with abdominal and pelvic pain, nausea and weakness. History of metastatic colon adenocarcinoma. EXAM: CT CHEST, ABDOMEN, AND PELVIS WITH CONTRAST TECHNIQUE: Multidetector CT imaging of the chest, abdomen and pelvis was performed following the standard protocol during bolus administration of intravenous contrast. CONTRAST:  136mL ISOVUE-300 IOPAMIDOL (ISOVUE-300) INJECTION 61% COMPARISON:  09/25/2016 and prior abdominal and pelvic CTs. 10/20/2015 chest CT FINDINGS: CT CHEST FINDINGS Cardiovascular: Subtle pulmonary emboli within left lower lobe segmental arteries noted. RV to LV ratio is 1.0. There is no evidence of thoracic aortic aneurysm. No pericardial effusion identified. Mediastinum/Nodes: Multiple enlarged lymph nodes within the mediastinum, right supraclavicular and left hilar region are new since 10/20/2015. An index right peritracheal node measures 2.2 cm (image 21). Thyroid gland is unremarkable. No tracheal abnormality identified. Lungs/Pleura: Irregular linear and and slightly nodular opacity within both lungs noted, index areas measuring 1.8 cm in the left lung apex (image 36) within the right upper lobe (image 42 and 45). A 1.7 cm central of left lymph node/nodule is identified (image 59). No pleural effusion or pneumothorax. Musculoskeletal: No chest wall mass or suspicious bone lesions identified. CT ABDOMEN PELVIS FINDINGS Hepatobiliary: A new subtle 1 cm hypodense right hepatic lesion (image 52) is compatible with metastasis. The patient is status post cholecystectomy with small amount of fluid in the cholecystectomy bed. There is no evidence of biliary dilatation. Pancreas: Unremarkable Spleen: Unremarkable Adrenals/Urinary Tract: Bilateral renal cortical atrophy noted. The adrenal glands are unremarkable. Soft  tissue thickening along the inferior bladder has increased. Stomach/Bowel: Colonic surgical changes noted. Increasing moderate ill-defined soft tissue and nodules in the region of the anastomosis noted. This is compatible with malignancy and has increased since 09/25/2016.  Increased stranding within the anterior mesenteric noted. Vascular/Lymphatic: Increasing lymphadenopathy within the retroperitoneum and mesenteric noted. An index left periaortic lymph node mass now measures 4.5 x 3.7 cm (image 76), previously less confluent and measuring 3.6 x 2.7 cm. There is no evidence of abdominal aortic aneurysm. Reproductive: Prostate enlargement again noted. Other: No ascites, bowel obstruction or pneumoperitoneum. A left lower quadrant ostomy is present. Musculoskeletal: No acute or suspicious bony abnormalities are identified. IMPRESSION: Left lower lobe pulmonary emboli with CT evidence of right heart strain (RV/LV Ratio = 1.0) consistent with at least submassive (intermediate risk) PE. The presence of right heart strain has been associated with an increased risk of morbidity and mortality. Increasing malignancy and metastatic disease within the chest abdomen and pelvis as described. Multiple attempts to reach the on call provider have been unsuccessful as of 7 p.m., but will continue. Electronically Signed: By: Margarette Canada M.D. On: 11/23/2016 19:03   Ct Abdomen Pelvis W Contrast Addendum Date: 11/23/2016   ADDENDUM REPORT: 11/23/2016 19:44 ADDENDUM: Critical Value/emergent results were called by telephone on 11/23/2016 at 7:15 pm to [facility], who verbally acknowledged these results. Electronically Signed   By: Margarette Canada M.D.   On: 11/23/2016 19:44  Result Date: 11/23/2016 CLINICAL DATA:  56 year old male with abdominal and pelvic pain, nausea and weakness. History of metastatic colon adenocarcinoma. EXAM: CT CHEST, ABDOMEN, AND PELVIS WITH CONTRAST TECHNIQUE: Multidetector CT imaging of the chest, abdomen and  pelvis was performed following the standard protocol during bolus administration of intravenous contrast. CONTRAST:  111mL ISOVUE-300 IOPAMIDOL (ISOVUE-300) INJECTION 61% COMPARISON:  09/25/2016 and prior abdominal and pelvic CTs. 10/20/2015 chest CT FINDINGS: CT CHEST FINDINGS Cardiovascular: Subtle pulmonary emboli within left lower lobe segmental arteries noted. RV to LV ratio is 1.0. There is no evidence of thoracic aortic aneurysm. No pericardial effusion identified. Mediastinum/Nodes: Multiple enlarged lymph nodes within the mediastinum, right supraclavicular and left hilar region are new since 10/20/2015. An index right peritracheal node measures 2.2 cm (image 21). Thyroid gland is unremarkable. No tracheal abnormality identified. Lungs/Pleura: Irregular linear and and slightly nodular opacity within both lungs noted, index areas measuring 1.8 cm in the left lung apex (image 36) within the right upper lobe (image 42 and 45). A 1.7 cm central of left lymph node/nodule is identified (image 59). No pleural effusion or pneumothorax. Musculoskeletal: No chest wall mass or suspicious bone lesions identified. CT ABDOMEN PELVIS FINDINGS Hepatobiliary: A new subtle 1 cm hypodense right hepatic lesion (image 52) is compatible with metastasis. The patient is status post cholecystectomy with small amount of fluid in the cholecystectomy bed. There is no evidence of biliary dilatation. Pancreas: Unremarkable Spleen: Unremarkable Adrenals/Urinary Tract: Bilateral renal cortical atrophy noted. The adrenal glands are unremarkable. Soft tissue thickening along the inferior bladder has increased. Stomach/Bowel: Colonic surgical changes noted. Increasing moderate ill-defined soft tissue and nodules in the region of the anastomosis noted. This is compatible with malignancy and has increased since 09/25/2016. Increased stranding within the anterior mesenteric noted. Vascular/Lymphatic: Increasing lymphadenopathy within the  retroperitoneum and mesenteric noted. An index left periaortic lymph node mass now measures 4.5 x 3.7 cm (image 76), previously less confluent and measuring 3.6 x 2.7 cm. There is no evidence of abdominal aortic aneurysm. Reproductive: Prostate enlargement again noted. Other: No ascites, bowel obstruction or pneumoperitoneum. A left lower quadrant ostomy is present. Musculoskeletal: No acute or suspicious bony abnormalities are identified. IMPRESSION: Left lower lobe pulmonary emboli with CT evidence of right heart strain (RV/LV Ratio =  1.0) consistent with at least submassive (intermediate risk) PE. The presence of right heart strain has been associated with an increased risk of morbidity and mortality. Increasing malignancy and metastatic disease within the chest abdomen and pelvis as described. Multiple attempts to reach the on call provider have been unsuccessful as of 7 p.m., but will continue. Electronically Signed: By: Margarette Canada M.D. On: 11/23/2016 19:03    2205:  IV heparin started. VSS. T/C to [facility] PCCM Dr. Jimmy Footman, case discussed, including:  HPI, pertinent PM/SHx, VS/PE, dx testing, ED course and treatment:  Agrees with IV heparin, requests to admit to [facility] service to [facility] stepdown and PCCM/Pulm MD will consult.   2215:  Dx and testing d/w pt and family.  Questions answered.  Verb understanding, agreeable to admit/transfer to [facility]. T/C to [facility] Dr. Shanon Brow, case discussed, including:  HPI, pertinent PM/SHx, VS/PE, dx testing, ED course and treatment, and d/w PCCM MD:  Agreeable to facilitate transfer/admit to [facility].     Final Clinical Impressions(s) / ED Diagnoses   Final diagnoses:  None    New Prescriptions New Prescriptions   No medications on file     Francine Graven, DO 11/26/16 2134

## 2016-11-23 NOTE — Progress Notes (Signed)
ANTICOAGULATION CONSULT NOTE - Initial Consult  Pharmacy Consult for Heparin Indication: pulmonary embolus  No Known Allergies  Patient Measurements: Weight: 190 lb (86.2 kg) Heparin Dosing Weight: 86.2 kg  Vital Signs: Temp: 97.9 F (36.6 C) (06/08 2048) Temp Source: Oral (06/08 2048) BP: 125/86 (06/08 2048) Pulse Rate: 96 (06/08 2048)  Labs:  Recent Labs  11/23/16 2045  HGB 12.5*  HCT 38.2*  PLT 353    CrCl cannot be calculated (Patient's most recent lab result is older than the maximum 21 days allowed.).   Medical History: Past Medical History:  Diagnosis Date  . 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 Anxiety   . Arthritis   . Diverticulitis   . GERD (gastroesophageal reflux disease)   . Hypertension   . Neuropathy     Medications:   (Not in a hospital admission)  Assessment: Left lower lobe pulmonary emboli with CT evidence of right heart strain.. Labs reviewed PTA medications reviewed  Goal of Therapy:  Heparin level 0.3-0.7 units/ml Monitor platelets by anticoagulation protocol: Yes   Plan:  Give 4000 units bolus x 1 Start heparin infusion at 1400 units/hr Check anti-Xa level in 6-8 hours and daily while on heparin Continue to monitor H&H and platelets  Derek Rodriguez, Derek Rodriguez 11/23/2016,9:23 PM

## 2016-11-23 NOTE — ED Notes (Signed)
Report given to floor nurse at [facility].

## 2016-11-23 NOTE — ED Triage Notes (Signed)
CT here this afternoon, sent from the cancer center Dx with a blood clot and asked to return to the ED  For evaluation  Cancer center

## 2016-11-23 NOTE — H&P (Signed)
History and Physical    Derek Rodriguez QQP:[id number] DOB: Feb 13, 1961 DOA: 11/23/2016  PCP: Derek Kaufmann, MD  Patient coming from: home  Chief Complaint:  Abnormal ct scan of chest  HPI: Derek Rodriguez is a 56 y.o. male with medical history significant of recurrent colon cancer with mets comes in after having work up as outpatient for recurrence of his colon cancer. Oncology had set up a ct scan of his chest which has incidentally found a submassive PE.  Pt denies any symptoms.  No chest pain, no pain with deep breathing.  No sob.  No issues with his breathing.  No leg or calf swelling or pain.  No fevers.  No h/o VTE.  He does report some dark colored urine for the last day, but he thinks this is more due to lack of drinking and not blood.  Denies any rectal bleeding or blood in his stool.  Pt vitals are all normal.  [facility] due to the clot burden on his ct scan.    Review of Systems: As per HPI otherwise 10 point review of systems negative.   Past Medical History:  Diagnosis Date  . Adenocarcinoma of sigmoid colon ([place]) 10/18/2015  . Anxiety   . Arthritis   . Diverticulitis   . GERD (gastroesophageal reflux disease)   . Hypertension   . Neuropathy     Past Surgical History:  Procedure Laterality Date  . BIOPSY  02/06/2016   Procedure: BIOPSY;  Surgeon: Daneil Dolin, MD;  [facility];  Service: Endoscopy;;  esophagus  . BIOPSY  10/09/2016   Procedure: BIOPSY;  Surgeon: Aviva Signs, MD;  [facility];  Service: Gastroenterology;;  sigmoid  . CHOLECYSTECTOMY N/A 10/12/2016   Procedure: CHOLECYSTECTOMY;  Surgeon: Aviva Signs, MD;  Location: [facility];  Service: General;  Laterality: N/A;  . COLECTOMY WITH COLOSTOMY CREATION/HARTMANN PROCEDURE N/A 10/12/2016   Procedure: COLECTOMY WITH COLOSTOMY CREATION/HARTMANN PROCEDURE (PALIATIVE) PARTIAL COLECTOMY WITH END OSTOMY CREATION SMALL BOWEL BYPASS;  Surgeon: Aviva Signs,  MD;  Location: [facility];  Service: General;  Laterality: N/A;  . COLONOSCOPY N/A 10/09/2016   Procedure: COLONOSCOPY;  Surgeon: Aviva Signs, MD;  [facility];  Service: Gastroenterology;  Laterality: N/A;  . ESOPHAGEAL DILATION  02/06/2016   Procedure: ESOPHAGEAL DILATION;  Surgeon: Daneil Dolin, MD;  [facility];  Service: Endoscopy;;  . ESOPHAGOGASTRODUODENOSCOPY N/A 02/06/2016   Procedure: ESOPHAGOGASTRODUODENOSCOPY (EGD);  Surgeon: Daneil Dolin, MD;  [facility];  Service: Endoscopy;  Laterality: N/A;  115  . PARTIAL COLECTOMY N/A 10/03/2015   Procedure: PARTIAL COLECTOMY;  Surgeon: Aviva Signs, MD;  Location: [facility];  Service: General;  Laterality: N/A;  . PORTACATH PLACEMENT N/A 11/18/2015   Procedure: INSERTION PORT-A-CATH LEFT SUBCLAVIAN;  Surgeon: Aviva Signs, MD;  Location: [facility];  Service: General;  Laterality: N/A;     reports that he quit smoking about 31 years ago. His smoking use included Cigars and Pipe. He quit smokeless tobacco use about 16 months ago. His smokeless tobacco use included Snuff. He reports that he does not drink alcohol or use drugs.  No Known Allergies  Family History  Problem Relation Age of Onset  . Diabetes Father     Prior to Admission medications   Medication Sig Start Date End Date Taking? Authorizing Provider  ALPRAZolam Duanne Moron) 1 MG tablet Take 1 tablet (1 mg total) by mouth 3 (three)  times daily as needed for anxiety. 11/20/16  Yes Kefalas, Manon Hilding, PA-C  cyclobenzaprine (FLEXERIL) 10 MG tablet Take 1 tablet (10 mg total) by mouth 3 (three) times daily as needed for muscle spasms. 10/21/16  Yes Aviva Signs, MD  fentaNYL (DURAGESIC - DOSED MCG/HR) 100 MCG/HR Place 1 patch (100 mcg total) onto the skin every 3 (three) days. 11/20/16  Yes Kefalas, Manon Hilding, PA-C  gabapentin (NEURONTIN) 300 MG capsule Take 1 capsule at HS x 3 days, then 2 capsules at HS x 3 days, and then 3 capsules at HS thereafter Patient taking  differently: Take 900 mg by mouth at bedtime.  09/14/16  Yes Kefalas, Manon Hilding, PA-C  HYDROmorphone (DILAUDID) 2 MG tablet Take 1 tablet (2 mg total) by mouth every 4 (four) hours as needed for severe pain. 11/20/16  Yes Baird Cancer, PA-C  lidocaine-prilocaine (EMLA) cream Apply a quarter size amount to port site 1 hour prior to chemo. Do not rub in. Cover with plastic wrap. 11/15/15  Yes Penland, Kelby Fam, MD  omeprazole (PRILOSEC) 40 MG capsule Take 1 capsule (40 mg total) by mouth daily. 06/08/16  Yes Kefalas, Manon Hilding, PA-C  ondansetron (ZOFRAN) 4 MG tablet Take 1 tablet (4 mg total) by mouth every 8 (eight) hours as needed for nausea or vomiting. 09/27/16  Yes Hosie Poisson, MD  senna-docusate (SENOKOT-S) 8.6-50 MG tablet Take 2 tablets by mouth at bedtime. Patient taking differently: Take 2 tablets by mouth at bedtime as needed (for constipation).  09/27/16  Yes Hosie Poisson, MD  zolpidem (AMBIEN) 10 MG tablet Take 1 tablet (10 mg total) by mouth at bedtime as needed for sleep. 06/08/16  Yes Kefalas, Manon Hilding, PA-C  amoxicillin-clavulanate (AUGMENTIN) 875-125 MG tablet Take 1 tablet by mouth 2 (two) times daily. Patient not taking: Reported on 11/23/2016 11/02/16   Twana First, MD  ondansetron (ZOFRAN-ODT) 4 MG disintegrating tablet Take 1 tablet (4 mg total) by mouth every 6 (six) hours as needed for nausea. Patient not taking: Reported on 11/23/2016 10/21/16   Aviva Signs, MD  oxyCODONE-acetaminophen (PERCOCET) 7.5-325 MG tablet Take 1 tablet by mouth every 6 (six) hours as needed for moderate pain or severe pain.  10/04/16   [provider]    Physical Exam: Vitals:   11/23/16 2048 11/23/16 2106 11/23/16 2200 11/23/16 2230  BP: 125/86  110/79 109/85  Pulse: 96  96 94  Resp: 20  17 12   Temp: 97.9 F (36.6 C)     TempSrc: Oral     SpO2: 96%  99% 97%  Weight:  86.2 kg (190 lb)      Constitutional: NAD, calm, comfortable Vitals:   11/23/16 2048 11/23/16 2106 11/23/16 2200  11/23/16 2230  BP: 125/86  110/79 109/85  Pulse: 96  96 94  Resp: 20  17 12   Temp: 97.9 F (36.6 C)     TempSrc: Oral     SpO2: 96%  99% 97%  Weight:  86.2 kg (190 lb)     Eyes: PERRL, lids and conjunctivae normal ENMT: Mucous membranes are moist. Posterior pharynx clear of any exudate or lesions.Normal dentition.  Neck: normal, supple, no masses, no thyromegaly Respiratory: clear to auscultation bilaterally, no wheezing, no crackles. Normal respiratory effort. No accessory muscle use.  Cardiovascular: Regular rate and rhythm, no murmurs / rubs / gallops. No extremity edema. 2+ pedal pulses. No carotid bruits.  Abdomen: no tenderness, no masses palpated. No hepatosplenomegaly. Bowel sounds positive.  Musculoskeletal: no clubbing /  cyanosis. No joint deformity upper and lower extremities. Good ROM, no contractures. Normal muscle tone.  Skin: no rashes, lesions, ulcers. No induration Neurologic: CN 2-12 grossly intact. Sensation intact, DTR normal. Strength 5/5 in all 4.  Psychiatric: Normal judgment and insight. Alert and oriented x 3. Seems depressed mood  Labs on Admission: I have personally reviewed following labs and imaging studies  CBC:  Recent Labs Lab 11/23/16 2045  WBC 8.1  NEUTROABS 5.7  HGB 12.5*  HCT 38.2*  MCV 80.3  PLT 962   Basic Metabolic Panel:  Recent Labs Lab 11/23/16 2045  NA 133*  K 4.5  CL 94*  CO2 28  GLUCOSE 111*  BUN 14  CREATININE 1.10  CALCIUM 9.4   GFR: Estimated Creatinine Clearance: 82.3 mL/min (by C-G formula based on SCr of 1.1 mg/dL).  Coagulation Profile:  Recent Labs Lab 11/23/16 2045  INR 1.10   Cardiac Enzymes:  Recent Labs Lab 11/23/16 2045  TROPONINI <0.03   Radiological Exams on Admission: Ct Chest W Contrast  Addendum Date: 11/23/2016   ADDENDUM REPORT: 11/23/2016 19:44 ADDENDUM: Critical Value/emergent results were called by telephone on 11/23/2016 at 7:15 pm to [facility], who verbally acknowledged these  results. Electronically Signed   By: Margarette Canada M.D.   On: 11/23/2016 19:44   Result Date: 11/23/2016 CLINICAL DATA:  56 year old male with abdominal and pelvic pain, nausea and weakness. History of metastatic colon adenocarcinoma. EXAM: CT CHEST, ABDOMEN, AND PELVIS WITH CONTRAST TECHNIQUE: Multidetector CT imaging of the chest, abdomen and pelvis was performed following the standard protocol during bolus administration of intravenous contrast. CONTRAST:  168mL ISOVUE-300 IOPAMIDOL (ISOVUE-300) INJECTION 61% COMPARISON:  09/25/2016 and prior abdominal and pelvic CTs. 10/20/2015 chest CT FINDINGS: CT CHEST FINDINGS Cardiovascular: Subtle pulmonary emboli within left lower lobe segmental arteries noted. RV to LV ratio is 1.0. There is no evidence of thoracic aortic aneurysm. No pericardial effusion identified. Mediastinum/Nodes: Multiple enlarged lymph nodes within the mediastinum, right supraclavicular and left hilar region are new since 10/20/2015. An index right peritracheal node measures 2.2 cm (image 21). Thyroid gland is unremarkable. No tracheal abnormality identified. Lungs/Pleura: Irregular linear and and slightly nodular opacity within both lungs noted, index areas measuring 1.8 cm in the left lung apex (image 36) within the right upper lobe (image 42 and 45). A 1.7 cm central of left lymph node/nodule is identified (image 59). No pleural effusion or pneumothorax. Musculoskeletal: No chest wall mass or suspicious bone lesions identified. CT ABDOMEN PELVIS FINDINGS Hepatobiliary: A new subtle 1 cm hypodense right hepatic lesion (image 52) is compatible with metastasis. The patient is status post cholecystectomy with small amount of fluid in the cholecystectomy bed. There is no evidence of biliary dilatation. Pancreas: Unremarkable Spleen: Unremarkable Adrenals/Urinary Tract: Bilateral renal cortical atrophy noted. The adrenal glands are unremarkable. Soft tissue thickening along the inferior bladder has  increased. Stomach/Bowel: Colonic surgical changes noted. Increasing moderate ill-defined soft tissue and nodules in the region of the anastomosis noted. This is compatible with malignancy and has increased since 09/25/2016. Increased stranding within the anterior mesenteric noted. Vascular/Lymphatic: Increasing lymphadenopathy within the retroperitoneum and mesenteric noted. An index left periaortic lymph node mass now measures 4.5 x 3.7 cm (image 76), previously less confluent and measuring 3.6 x 2.7 cm. There is no evidence of abdominal aortic aneurysm. Reproductive: Prostate enlargement again noted. Other: No ascites, bowel obstruction or pneumoperitoneum. A left lower quadrant ostomy is present. Musculoskeletal: No acute or suspicious bony abnormalities are identified. IMPRESSION: Left  lower lobe pulmonary emboli with CT evidence of right heart strain (RV/LV Ratio = 1.0) consistent with at least submassive (intermediate risk) PE. The presence of right heart strain has been associated with an increased risk of morbidity and mortality. Increasing malignancy and metastatic disease within the chest abdomen and pelvis as described. Multiple attempts to reach the on call provider have been unsuccessful as of 7 p.m., but will continue. Electronically Signed: By: Margarette Canada M.D. On: 11/23/2016 19:03   Ct Abdomen Pelvis W Contrast  Addendum Date: 11/23/2016   ADDENDUM REPORT: 11/23/2016 19:44 ADDENDUM: Critical Value/emergent results were called by telephone on 11/23/2016 at 7:15 pm to [facility], who verbally acknowledged these results. Electronically Signed   By: Margarette Canada M.D.   On: 11/23/2016 19:44   Result Date: 11/23/2016 CLINICAL DATA:  56 year old male with abdominal and pelvic pain, nausea and weakness. History of metastatic colon adenocarcinoma. EXAM: CT CHEST, ABDOMEN, AND PELVIS WITH CONTRAST TECHNIQUE: Multidetector CT imaging of the chest, abdomen and pelvis was performed following the standard  protocol during bolus administration of intravenous contrast. CONTRAST:  143mL ISOVUE-300 IOPAMIDOL (ISOVUE-300) INJECTION 61% COMPARISON:  09/25/2016 and prior abdominal and pelvic CTs. 10/20/2015 chest CT FINDINGS: CT CHEST FINDINGS Cardiovascular: Subtle pulmonary emboli within left lower lobe segmental arteries noted. RV to LV ratio is 1.0. There is no evidence of thoracic aortic aneurysm. No pericardial effusion identified. Mediastinum/Nodes: Multiple enlarged lymph nodes within the mediastinum, right supraclavicular and left hilar region are new since 10/20/2015. An index right peritracheal node measures 2.2 cm (image 21). Thyroid gland is unremarkable. No tracheal abnormality identified. Lungs/Pleura: Irregular linear and and slightly nodular opacity within both lungs noted, index areas measuring 1.8 cm in the left lung apex (image 36) within the right upper lobe (image 42 and 45). A 1.7 cm central of left lymph node/nodule is identified (image 59). No pleural effusion or pneumothorax. Musculoskeletal: No chest wall mass or suspicious bone lesions identified. CT ABDOMEN PELVIS FINDINGS Hepatobiliary: A new subtle 1 cm hypodense right hepatic lesion (image 52) is compatible with metastasis. The patient is status post cholecystectomy with small amount of fluid in the cholecystectomy bed. There is no evidence of biliary dilatation. Pancreas: Unremarkable Spleen: Unremarkable Adrenals/Urinary Tract: Bilateral renal cortical atrophy noted. The adrenal glands are unremarkable. Soft tissue thickening along the inferior bladder has increased. Stomach/Bowel: Colonic surgical changes noted. Increasing moderate ill-defined soft tissue and nodules in the region of the anastomosis noted. This is compatible with malignancy and has increased since 09/25/2016. Increased stranding within the anterior mesenteric noted. Vascular/Lymphatic: Increasing lymphadenopathy within the retroperitoneum and mesenteric noted. An index left  periaortic lymph node mass now measures 4.5 x 3.7 cm (image 76), previously less confluent and measuring 3.6 x 2.7 cm. There is no evidence of abdominal aortic aneurysm. Reproductive: Prostate enlargement again noted. Other: No ascites, bowel obstruction or pneumoperitoneum. A left lower quadrant ostomy is present. Musculoskeletal: No acute or suspicious bony abnormalities are identified. IMPRESSION: Left lower lobe pulmonary emboli with CT evidence of right heart strain (RV/LV Ratio = 1.0) consistent with at least submassive (intermediate risk) PE. The presence of right heart strain has been associated with an increased risk of morbidity and mortality. Increasing malignancy and metastatic disease within the chest abdomen and pelvis as described. Multiple attempts to reach the on call provider have been unsuccessful as of 7 p.m., but will continue. Electronically Signed: By: Margarette Canada M.D. On: 11/23/2016 19:03    EKG: Independently reviewed. nsr no  acute issues Old chart reviewed Case discussed with dr mcmannus  Assessment/Plan 56 yo male with h/o stage 4 colon cancer recurred comes in with incidental finding of submassive PE which is asymptomatic with evidence of right heart strain  Principal Problem:   PE (pulmonary thromboembolism) ([place])-  Place on heparin drip.  Transfer to stepdown at [facility].  PCCM to see on arrival there.  Will ck ua due to possible report of hematuria.  hgb stable.  No overt bleeding.  Obtain cardiac echo in the am.  Serial troponin overnight.  Monitor closely in [facility]down unit at [facility] due to high clot burden and high risk of deterioration.  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

## 2016-11-24 ENCOUNTER — Inpatient Hospital Stay (HOSPITAL_COMMUNITY): Payer: BLUE CROSS/BLUE SHIELD

## 2016-11-24 DIAGNOSIS — I2699 Other pulmonary embolism without acute cor pulmonale: Secondary | ICD-10-CM

## 2016-11-24 DIAGNOSIS — R9431 Abnormal electrocardiogram [ECG] [EKG]: Secondary | ICD-10-CM

## 2016-11-24 LAB — BASIC METABOLIC PANEL
ANION GAP: 11 (ref 5–15)
BUN: 11 mg/dL (ref 6–20)
CALCIUM: 9.1 mg/dL (ref 8.9–10.3)
CO2: 26 mmol/L (ref 22–32)
Chloride: 94 mmol/L — ABNORMAL LOW (ref 101–111)
Creatinine, Ser: 1.11 mg/dL (ref 0.61–1.24)
Glucose, Bld: 89 mg/dL (ref 65–99)
POTASSIUM: 3.5 mmol/L (ref 3.5–5.1)
SODIUM: 131 mmol/L — AB (ref 135–145)

## 2016-11-24 LAB — ECHOCARDIOGRAM COMPLETE
HEIGHTINCHES: 73 in
WEIGHTICAEL: 2881.6 [oz_av]

## 2016-11-24 LAB — CBC
HEMATOCRIT: 36.3 % — AB (ref 39.0–52.0)
Hemoglobin: 11.8 g/dL — ABNORMAL LOW (ref 13.0–17.0)
MCH: 26 pg (ref 26.0–34.0)
MCHC: 32.5 g/dL (ref 30.0–36.0)
MCV: 80.1 fL (ref 78.0–100.0)
Platelets: 329 10*3/uL (ref 150–400)
RBC: 4.53 MIL/uL (ref 4.22–5.81)
RDW: 14.6 % (ref 11.5–15.5)
WBC: 8.1 10*3/uL (ref 4.0–10.5)

## 2016-11-24 LAB — MRSA PCR SCREENING: MRSA BY PCR: NEGATIVE

## 2016-11-24 LAB — HEPARIN LEVEL (UNFRACTIONATED)
HEPARIN UNFRACTIONATED: 0.39 [IU]/mL (ref 0.30–0.70)
HEPARIN UNFRACTIONATED: 0.33 [IU]/mL (ref 0.30–0.70)

## 2016-11-24 LAB — TROPONIN I
Troponin I: <0.03 ng/mL (ref <0.03)
Troponin I: <0.03 ng/mL (ref <0.03)

## 2016-11-24 MED ORDER — OXYCODONE-ACETAMINOPHEN 7.5-325 MG PO TABS: 1.0000 | ORAL_TABLET | Freq: Four times a day (QID) | ORAL | Status: DC | PRN

## 2016-11-24 MED ORDER — ENSURE ENLIVE PO LIQD
237.0000 mL | Freq: Three times a day (TID) | ORAL | Status: DC
  Administered 2016-11-24 – 2016-11-25 (×2): 237 mL via ORAL

## 2016-11-24 MED ORDER — ZOLPIDEM TARTRATE 5 MG PO TABS: 10.0000 mg | ORAL_TABLET | Freq: Every evening | ORAL | Status: DC | PRN

## 2016-11-24 MED ORDER — ALPRAZOLAM 0.5 MG PO TABS
1.0000 mg | ORAL_TABLET | Freq: Three times a day (TID) | ORAL | Status: DC | PRN
  Administered 2016-11-24 – 2016-11-25 (×5): 1 mg via ORAL
  Filled 2016-11-24 (×5): qty 2

## 2016-11-24 MED ORDER — ONDANSETRON HCL 4 MG PO TABS: 4.0000 mg | ORAL_TABLET | Freq: Three times a day (TID) | ORAL | Status: DC | PRN

## 2016-11-24 MED ORDER — OXYCODONE-ACETAMINOPHEN 5-325 MG PO TABS: 1.0000 | ORAL_TABLET | Freq: Four times a day (QID) | ORAL | Status: DC | PRN

## 2016-11-24 MED ORDER — PERFLUTREN LIPID MICROSPHERE
1.0000 mL | INTRAVENOUS | Status: AC | PRN
  Administered 2016-11-24: 1 mL via INTRAVENOUS
  Filled 2016-11-24: qty 10

## 2016-11-24 MED ORDER — FENTANYL 25 MCG/HR TD PT72: 100.0000 ug | MEDICATED_PATCH | TRANSDERMAL | Status: DC

## 2016-11-24 MED ORDER — CYCLOBENZAPRINE HCL 10 MG PO TABS: 10.0000 mg | ORAL_TABLET | Freq: Three times a day (TID) | ORAL | Status: DC | PRN

## 2016-11-24 MED ORDER — HYDROMORPHONE HCL 2 MG PO TABS
2.0000 mg | ORAL_TABLET | ORAL | Status: DC | PRN
  Administered 2016-11-24 – 2016-11-25 (×5): 2 mg via ORAL
  Filled 2016-11-24 (×5): qty 1

## 2016-11-24 MED ORDER — SODIUM CHLORIDE 0.9 % IV SOLN
250.0000 mL | INTRAVENOUS | Status: DC | PRN
Start: 2016-11-24 — End: 2016-11-25

## 2016-11-24 MED ORDER — SODIUM CHLORIDE 0.9% FLUSH: 3.0000 mL | INTRAVENOUS | Status: DC | PRN

## 2016-11-24 MED ORDER — SODIUM CHLORIDE 0.9% FLUSH
3.0000 mL | Freq: Two times a day (BID) | INTRAVENOUS | Status: DC
  Administered 2016-11-24: 10 mL via INTRAVENOUS
  Administered 2016-11-25: 3 mL via INTRAVENOUS

## 2016-11-24 MED ORDER — OXYCODONE HCL 5 MG PO TABS: 2.5000 mg | ORAL_TABLET | Freq: Four times a day (QID) | ORAL | Status: DC | PRN

## 2016-11-24 MED ORDER — SODIUM CHLORIDE 0.9% FLUSH
10.0000 mL | INTRAVENOUS | Status: DC | PRN
  Administered 2016-11-25: 10 mL
  Filled 2016-11-24: qty 40

## 2016-11-24 MED ORDER — ENSURE ENLIVE PO LIQD
237.0000 mL | Freq: Two times a day (BID) | ORAL | Status: DC
  Administered 2016-11-24: 237 mL via ORAL

## 2016-11-24 NOTE — Consult Note (Signed)
PULMONARY / CRITICAL CARE MEDICINE   Name: Derek Rodriguez MRN: [id number] DOB: [birth year]    ADMISSION DATE:  11/23/2016 CONSULTATION DATE:  November 24, 2016  REFERRING MD:  Phillips Grout, MD  CHIEF COMPLAINT:  Abnormal imaging  HISTORY OF PRESENT ILLNESS:   Derek Rodriguez is a pleasant 56 y.o. man with a history of stage IV colon CA s/p resection, chemo, w/ recent cholecystectomy and colectomy with colostomy in April of 2018. He was doing well with no complaints but a routine chest CT yesterday demonstrated left lower lobe PE with elevated RV/LV ratio, and was directed for admission. PCCM was consulted for evaluation of advanced PE management.   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  PAST SURGICAL HISTORY: He  has a past surgical history that includes Partial colectomy (N/A, 10/03/2015); Portacath placement (N/A, 11/18/2015); Esophagogastroduodenoscopy (N/A, 02/06/2016); Esophageal dilation (02/06/2016); biopsy (02/06/2016); Colonoscopy (N/A, 10/09/2016); biopsy (10/09/2016); Cholecystectomy (N/A, 10/12/2016); and Colectomy with colostomy creation/hartmann procedure (N/A, 10/12/2016).  No Known Allergies  Current Facility-Administered Medications on File Prior to Encounter  Medication  . sodium chloride flush (NS) 0.9 % injection 10 mL   Current Outpatient Prescriptions on File Prior to Encounter  Medication Sig  . ALPRAZolam (XANAX) 1 MG tablet Take 1 tablet (1 mg total) by mouth 3 (three) times daily as needed for anxiety.  . cyclobenzaprine (FLEXERIL) 10 MG tablet Take 1 tablet (10 mg total) by mouth 3 (three) times daily as needed for muscle spasms.  . fentaNYL (DURAGESIC - DOSED MCG/HR) 100 MCG/HR Place 1 patch (100 mcg total) onto the skin every 3 (three) days.  Marland Kitchen gabapentin (NEURONTIN) 300 MG capsule Take 1 capsule at  HS x 3 days, then 2 capsules at HS x 3 days, and then 3 capsules at HS thereafter (Patient taking differently: Take 900 mg by mouth at bedtime. )  . HYDROmorphone (DILAUDID) 2 MG tablet Take 1 tablet (2 mg total) by mouth every 4 (four) hours as needed for severe pain.  Marland Kitchen lidocaine-prilocaine (EMLA) cream Apply a quarter size amount to port site 1 hour prior to chemo. Do not rub in. Cover with plastic wrap.  . omeprazole (PRILOSEC) 40 MG capsule Take 1 capsule (40 mg total) by mouth daily.  . ondansetron (ZOFRAN) 4 MG tablet Take 1 tablet (4 mg total) by mouth every 8 (eight) hours as needed for nausea or vomiting.  . senna-docusate (SENOKOT-S) 8.6-50 MG tablet Take 2 tablets by mouth at bedtime. (Patient taking differently: Take 2 tablets by mouth at bedtime as needed (for constipation). )  . zolpidem (AMBIEN) 10 MG tablet Take 1 tablet (10 mg total) by mouth at bedtime as needed for sleep.  Marland Kitchen amoxicillin-clavulanate (AUGMENTIN) 875-125 MG tablet Take 1 tablet by mouth 2 (two) times daily. (Patient not taking: Reported on 11/23/2016)  . ondansetron (ZOFRAN-ODT) 4 MG disintegrating tablet Take 1 tablet (4 mg total) by mouth every 6 (six) hours as needed for nausea. (Patient not taking: Reported on 11/23/2016)  . oxyCODONE-acetaminophen (PERCOCET) 7.5-325 MG tablet Take 1 tablet by mouth every 6 (six) hours as needed for moderate pain or severe pain.     FAMILY HISTORY:  His indicated that the status of his father is unknown.    SOCIAL HISTORY: He  reports that he quit smoking about 31 years ago. His smoking use included Cigars and Pipe. He quit  smokeless tobacco use about 16 months ago. His smokeless tobacco use included Snuff. He reports that he does not drink alcohol or use drugs.  REVIEW OF SYSTEMS:   Per HPI  VITAL SIGNS: BP 127/80   Pulse 96   Temp 99 F (37.2 C) (Oral)   Resp 18   Ht 6\' 1"  (1.854 m)   Wt 180 lb 1.6 oz (81.7 kg)   SpO2 96%   BMI 23.76 kg/m   HEMODYNAMICS:     VENTILATOR SETTINGS:    INTAKE / OUTPUT: No intake/output data recorded.  PHYSICAL EXAMINATION: General:  Well appearing man in NAD Neuro:  Awake, alert HEENT:  MMM Cardiovascular:  Normal rate, rhythm Lungs:  CTA Abdomen:  Soft Musculoskeletal:  No deformities Skin:  No rashes on visible skin  LABS:  BMET  Recent Labs Lab 11/23/16 2045  NA 133*  K 4.5  CL 94*  CO2 28  BUN 14  CREATININE 1.10  GLUCOSE 111*    Electrolytes  Recent Labs Lab 11/23/16 2045  CALCIUM 9.4    CBC  Recent Labs Lab 11/23/16 2045  WBC 8.1  HGB 12.5*  HCT 38.2*  PLT 353    Coag's  Recent Labs Lab 11/23/16 2045  INR 1.10    Sepsis Markers No results for input(s): LATICACIDVEN, PROCALCITON, O2SATVEN in the last 168 hours.  ABG No results for input(s): PHART, PCO2ART, PO2ART in the last 168 hours.  Liver Enzymes No results for input(s): AST, ALT, ALKPHOS, BILITOT, ALBUMIN in the last 168 hours.  Cardiac Enzymes  Recent Labs Lab 11/23/16 2045  TROPONINI <0.03    Glucose No results for input(s): GLUCAP in the last 168 hours.  Imaging Ct Chest W Contrast  Addendum Date: 11/23/2016   ADDENDUM REPORT: 11/23/2016 19:44 ADDENDUM: Critical Value/emergent results were called by telephone on 11/23/2016 at 7:15 pm to [facility], who verbally acknowledged these results. Electronically Signed   By: Margarette Canada M.D.   On: 11/23/2016 19:44   Result Date: 11/23/2016 CLINICAL DATA:  56 year old male with abdominal and pelvic pain, nausea and weakness. History of metastatic colon adenocarcinoma. EXAM: CT CHEST, ABDOMEN, AND PELVIS WITH CONTRAST TECHNIQUE: Multidetector CT imaging of the chest, abdomen and pelvis was performed following the standard protocol during bolus administration of intravenous contrast. CONTRAST:  142mL ISOVUE-300 IOPAMIDOL (ISOVUE-300) INJECTION 61% COMPARISON:  09/25/2016 and prior abdominal and pelvic CTs. 10/20/2015 chest CT FINDINGS: CT CHEST FINDINGS  Cardiovascular: Subtle pulmonary emboli within left lower lobe segmental arteries noted. RV to LV ratio is 1.0. There is no evidence of thoracic aortic aneurysm. No pericardial effusion identified. Mediastinum/Nodes: Multiple enlarged lymph nodes within the mediastinum, right supraclavicular and left hilar region are new since 10/20/2015. An index right peritracheal node measures 2.2 cm (image 21). Thyroid gland is unremarkable. No tracheal abnormality identified. Lungs/Pleura: Irregular linear and and slightly nodular opacity within both lungs noted, index areas measuring 1.8 cm in the left lung apex (image 36) within the right upper lobe (image 42 and 45). A 1.7 cm central of left lymph node/nodule is identified (image 59). No pleural effusion or pneumothorax. Musculoskeletal: No chest wall mass or suspicious bone lesions identified. CT ABDOMEN PELVIS FINDINGS Hepatobiliary: A new subtle 1 cm hypodense right hepatic lesion (image 52) is compatible with metastasis. The patient is status post cholecystectomy with small amount of fluid in the cholecystectomy bed. There is no evidence of biliary dilatation. Pancreas: Unremarkable Spleen: Unremarkable Adrenals/Urinary Tract: Bilateral renal cortical atrophy noted. The adrenal glands are  unremarkable. Soft tissue thickening along the inferior bladder has increased. Stomach/Bowel: Colonic surgical changes noted. Increasing moderate ill-defined soft tissue and nodules in the region of the anastomosis noted. This is compatible with malignancy and has increased since 09/25/2016. Increased stranding within the anterior mesenteric noted. Vascular/Lymphatic: Increasing lymphadenopathy within the retroperitoneum and mesenteric noted. An index left periaortic lymph node mass now measures 4.5 x 3.7 cm (image 76), previously less confluent and measuring 3.6 x 2.7 cm. There is no evidence of abdominal aortic aneurysm. Reproductive: Prostate enlargement again noted. Other: No  ascites, bowel obstruction or pneumoperitoneum. A left lower quadrant ostomy is present. Musculoskeletal: No acute or suspicious bony abnormalities are identified. IMPRESSION: Left lower lobe pulmonary emboli with CT evidence of right heart strain (RV/LV Ratio = 1.0) consistent with at least submassive (intermediate risk) PE. The presence of right heart strain has been associated with an increased risk of morbidity and mortality. Increasing malignancy and metastatic disease within the chest abdomen and pelvis as described. Multiple attempts to reach the on call provider have been unsuccessful as of 7 p.m., but will continue. Electronically Signed: By: Margarette Canada M.D. On: 11/23/2016 19:03   Ct Abdomen Pelvis W Contrast  Addendum Date: 11/23/2016   ADDENDUM REPORT: 11/23/2016 19:44 ADDENDUM: Critical Value/emergent results were called by telephone on 11/23/2016 at 7:15 pm to [facility], who verbally acknowledged these results. Electronically Signed   By: Margarette Canada M.D.   On: 11/23/2016 19:44   Result Date: 11/23/2016 CLINICAL DATA:  56 year old male with abdominal and pelvic pain, nausea and weakness. History of metastatic colon adenocarcinoma. EXAM: CT CHEST, ABDOMEN, AND PELVIS WITH CONTRAST TECHNIQUE: Multidetector CT imaging of the chest, abdomen and pelvis was performed following the standard protocol during bolus administration of intravenous contrast. CONTRAST:  170mL ISOVUE-300 IOPAMIDOL (ISOVUE-300) INJECTION 61% COMPARISON:  09/25/2016 and prior abdominal and pelvic CTs. 10/20/2015 chest CT FINDINGS: CT CHEST FINDINGS Cardiovascular: Subtle pulmonary emboli within left lower lobe segmental arteries noted. RV to LV ratio is 1.0. There is no evidence of thoracic aortic aneurysm. No pericardial effusion identified. Mediastinum/Nodes: Multiple enlarged lymph nodes within the mediastinum, right supraclavicular and left hilar region are new since 10/20/2015. An index right peritracheal node measures 2.2 cm  (image 21). Thyroid gland is unremarkable. No tracheal abnormality identified. Lungs/Pleura: Irregular linear and and slightly nodular opacity within both lungs noted, index areas measuring 1.8 cm in the left lung apex (image 36) within the right upper lobe (image 42 and 45). A 1.7 cm central of left lymph node/nodule is identified (image 59). No pleural effusion or pneumothorax. Musculoskeletal: No chest wall mass or suspicious bone lesions identified. CT ABDOMEN PELVIS FINDINGS Hepatobiliary: A new subtle 1 cm hypodense right hepatic lesion (image 52) is compatible with metastasis. The patient is status post cholecystectomy with small amount of fluid in the cholecystectomy bed. There is no evidence of biliary dilatation. Pancreas: Unremarkable Spleen: Unremarkable Adrenals/Urinary Tract: Bilateral renal cortical atrophy noted. The adrenal glands are unremarkable. Soft tissue thickening along the inferior bladder has increased. Stomach/Bowel: Colonic surgical changes noted. Increasing moderate ill-defined soft tissue and nodules in the region of the anastomosis noted. This is compatible with malignancy and has increased since 09/25/2016. Increased stranding within the anterior mesenteric noted. Vascular/Lymphatic: Increasing lymphadenopathy within the retroperitoneum and mesenteric noted. An index left periaortic lymph node mass now measures 4.5 x 3.7 cm (image 76), previously less confluent and measuring 3.6 x 2.7 cm. There is no evidence of abdominal aortic aneurysm. Reproductive: Prostate  enlargement again noted. Other: No ascites, bowel obstruction or pneumoperitoneum. A left lower quadrant ostomy is present. Musculoskeletal: No acute or suspicious bony abnormalities are identified. IMPRESSION: Left lower lobe pulmonary emboli with CT evidence of right heart strain (RV/LV Ratio = 1.0) consistent with at least submassive (intermediate risk) PE. The presence of right heart strain has been associated with an  increased risk of morbidity and mortality. Increasing malignancy and metastatic disease within the chest abdomen and pelvis as described. Multiple attempts to reach the on call provider have been unsuccessful as of 7 p.m., but will continue. Electronically Signed: By: Margarette Canada M.D. On: 11/23/2016 19:03    ASSESSMENT / PLAN:  Derek Rodriguez is a 56 y/o man with hx of colon CA, recent sgy, and small, asymptomatic PE with some evidence of right heart strain. He is presently being treated with heparin infusion and tolerating this well. He has no hypotension or significant tachycardia, and intervention with lytics or invasive procedures is not indicated.    CRITICAL CARE Performed by: Luz Brazen   Total critical care time: 30 minutes  Critical care time was exclusive of separately billable procedures and treating other patients.  Critical care was necessary to treat or prevent imminent or life-threatening deterioration.  Critical care was time spent personally by me on the following activities: development of treatment plan with patient and/or surrogate as well as nursing, discussions with consultants, evaluation of patient's response to treatment, examination of patient, obtaining history from patient or surrogate, ordering and performing treatments and interventions, ordering and review of laboratory studies, ordering and review of radiographic studies, pulse oximetry and re-evaluation of patient's condition.  Luz Brazen, MD Pulmonary [facility] [place] Pager: [phone]  11/24/2016, 1:34 AM

## 2016-11-24 NOTE — Progress Notes (Signed)
PROGRESS NOTE    Derek Rodriguez  WNU:[id number] DOB: 08-Nov-1960 DOA: 11/23/2016 PCP: [name], MD    Brief Narrative:  56 y.o. male with medical history significant of recurrent colon cancer with mets comes in after having work up as outpatient for recurrence of his colon cancer. Oncology had set up a ct scan of his chest which has incidentally found a submassive PE.   Assessment & Plan:   Principal Problem:   Pulmonary embolism on left [facility]) - Pulmonology on board and assisting. - Patient currently on heparin -Continue pain control  Active Problems:   S/P partial colectomy   Depression    Primary colon cancer with invasion or adherence to other organ or structure (T4b) ([place]) - Pt to continue routine f/u with oncology    DVT prophylaxis: Heparin drip Code Status: Full Family Communication: Discussed with patient and presumed spouse with patient's permission Disposition Plan: Pending improvement in condition and recommendations by pulmonologist   Consultants:   Pulmonology   Procedures:  Echocardiogram   Antimicrobials: none   Subjective: The patient has undergone complaints. No acute issues overnight  Objective: Vitals:   11/24/16 0841 11/24/16 0930 11/24/16 1330 11/24/16 1433  BP:  108/74 101/80   Pulse: (!) 103 (!) 101 (!) 101 94  Resp: 15 12 (!) 21 15  Temp:    98.2 F (36.8 C)  TempSrc:    Oral  SpO2: 96% 95% 97% 99%  Weight:      Height:        Intake/Output Summary (Last 24 hours) at 11/24/16 1512 Last data filed at 11/24/16 1400  Gross per 24 hour  Intake           552.25 ml  Output                0 ml  Net           552.25 ml   Filed Weights   11/23/16 2106 11/24/16 0100  Weight: 86.2 kg (190 lb) 81.7 kg (180 lb 1.6 oz)    Examination:  General exam: Appears calm and comfortable, in nad Respiratory system: equal chest rise, no wheezes Cardiovascular system: S1 & S2 heard, RRR. No JVD, murmurs, rubs, gallops or clicks. No  pedal edema. Gastrointestinal system: Abdomen is nondistended, soft and nontender. No organomegaly or masses felt. Normal bowel sounds heard. Central nervous system: Alert and oriented. No focal neurological deficits. Extremities: Symmetric 5 x 5 power. Skin: No rashes, lesions or ulcers, on limited exam  Psychiatry: Mood & affect appropriate.     Data Reviewed: I have personally reviewed following labs and imaging studies  CBC:  Recent Labs Lab 11/23/16 2045 11/24/16 0815  WBC 8.1 8.1  NEUTROABS 5.7  --   HGB 12.5* 11.8*  HCT 38.2* 36.3*  MCV 80.3 80.1  PLT 353 034   Basic Metabolic Panel:  Recent Labs Lab 11/23/16 2045 11/24/16 0815  NA 133* 131*  K 4.5 3.5  CL 94* 94*  CO2 28 26  GLUCOSE 111* 89  BUN 14 11  CREATININE 1.10 1.11  CALCIUM 9.4 9.1   GFR: Estimated Creatinine Clearance: 84 mL/min (by C-G formula based on SCr of 1.11 mg/dL). Liver Function Tests: No results for input(s): AST, ALT, ALKPHOS, BILITOT, PROT, ALBUMIN in the last 168 hours. No results for input(s): LIPASE, AMYLASE in the last 168 hours. No results for input(s): AMMONIA in the last 168 hours. Coagulation Profile:  Recent Labs Lab 11/23/16 2045  INR  1.10   Cardiac Enzymes:  Recent Labs Lab 11/23/16 2045 11/24/16 0210 11/24/16 0815 11/24/16 1330  TROPONINI <0.03 <0.03 <0.03 <0.03   BNP (last 3 results) No results for input(s): PROBNP in the last 8760 hours. HbA1C: No results for input(s): HGBA1C in the last 72 hours. CBG: No results for input(s): GLUCAP in the last 168 hours. Lipid Profile: No results for input(s): CHOL, HDL, LDLCALC, TRIG, CHOLHDL, LDLDIRECT in the last 72 hours. Thyroid Function Tests: No results for input(s): TSH, T4TOTAL, FREET4, T3FREE, THYROIDAB in the last 72 hours. Anemia Panel: No results for input(s): VITAMINB12, FOLATE, FERRITIN, TIBC, IRON, RETICCTPCT in the last 72 hours. Sepsis Labs: No results for input(s): PROCALCITON, LATICACIDVEN in  the last 168 hours.  Recent Results (from the past 240 hour(s))  MRSA PCR Screening     Status: None   Collection Time: 11/24/16  1:02 AM  Result Value Ref Range Status   MRSA by PCR NEGATIVE NEGATIVE Final    Comment:        The GeneXpert MRSA Assay (FDA approved for NASAL specimens only), is one component of a comprehensive MRSA colonization surveillance program. It is not intended to diagnose MRSA infection nor to guide or monitor treatment for MRSA infections.          Radiology Studies: Ct Chest W Contrast  Addendum Date: 11/23/2016   ADDENDUM REPORT: 11/23/2016 19:44 ADDENDUM: Critical Value/emergent results were called by telephone on 11/23/2016 at 7:15 pm to [facility], who verbally acknowledged these results. Electronically Signed   By: Margarette Canada M.D.   On: 11/23/2016 19:44   Result Date: 11/23/2016 CLINICAL DATA:  56 year old male with abdominal and pelvic pain, nausea and weakness. History of metastatic colon adenocarcinoma. EXAM: CT CHEST, ABDOMEN, AND PELVIS WITH CONTRAST TECHNIQUE: Multidetector CT imaging of the chest, abdomen and pelvis was performed following the standard protocol during bolus administration of intravenous contrast. CONTRAST:  164mL ISOVUE-300 IOPAMIDOL (ISOVUE-300) INJECTION 61% COMPARISON:  09/25/2016 and prior abdominal and pelvic CTs. 10/20/2015 chest CT FINDINGS: CT CHEST FINDINGS Cardiovascular: Subtle pulmonary emboli within left lower lobe segmental arteries noted. RV to LV ratio is 1.0. There is no evidence of thoracic aortic aneurysm. No pericardial effusion identified. Mediastinum/Nodes: Multiple enlarged lymph nodes within the mediastinum, right supraclavicular and left hilar region are new since 10/20/2015. An index right peritracheal node measures 2.2 cm (image 21). Thyroid gland is unremarkable. No tracheal abnormality identified. Lungs/Pleura: Irregular linear and and slightly nodular opacity within both lungs noted, index areas measuring  1.8 cm in the left lung apex (image 36) within the right upper lobe (image 42 and 45). A 1.7 cm central of left lymph node/nodule is identified (image 59). No pleural effusion or pneumothorax. Musculoskeletal: No chest wall mass or suspicious bone lesions identified. CT ABDOMEN PELVIS FINDINGS Hepatobiliary: A new subtle 1 cm hypodense right hepatic lesion (image 52) is compatible with metastasis. The patient is status post cholecystectomy with small amount of fluid in the cholecystectomy bed. There is no evidence of biliary dilatation. Pancreas: Unremarkable Spleen: Unremarkable Adrenals/Urinary Tract: Bilateral renal cortical atrophy noted. The adrenal glands are unremarkable. Soft tissue thickening along the inferior bladder has increased. Stomach/Bowel: Colonic surgical changes noted. Increasing moderate ill-defined soft tissue and nodules in the region of the anastomosis noted. This is compatible with malignancy and has increased since 09/25/2016. Increased stranding within the anterior mesenteric noted. Vascular/Lymphatic: Increasing lymphadenopathy within the retroperitoneum and mesenteric noted. An index left periaortic lymph node mass now measures 4.5 x  3.7 cm (image 76), previously less confluent and measuring 3.6 x 2.7 cm. There is no evidence of abdominal aortic aneurysm. Reproductive: Prostate enlargement again noted. Other: No ascites, bowel obstruction or pneumoperitoneum. A left lower quadrant ostomy is present. Musculoskeletal: No acute or suspicious bony abnormalities are identified. IMPRESSION: Left lower lobe pulmonary emboli with CT evidence of right heart strain (RV/LV Ratio = 1.0) consistent with at least submassive (intermediate risk) PE. The presence of right heart strain has been associated with an increased risk of morbidity and mortality. Increasing malignancy and metastatic disease within the chest abdomen and pelvis as described. Multiple attempts to reach the on call provider have been  unsuccessful as of 7 p.m., but will continue. Electronically Signed: By: Margarette Canada M.D. On: 11/23/2016 19:03   Ct Abdomen Pelvis W Contrast  Addendum Date: 11/23/2016   ADDENDUM REPORT: 11/23/2016 19:44 ADDENDUM: Critical Value/emergent results were called by telephone on 11/23/2016 at 7:15 pm to [facility], who verbally acknowledged these results. Electronically Signed   By: Margarette Canada M.D.   On: 11/23/2016 19:44   Result Date: 11/23/2016 CLINICAL DATA:  56 year old male with abdominal and pelvic pain, nausea and weakness. History of metastatic colon adenocarcinoma. EXAM: CT CHEST, ABDOMEN, AND PELVIS WITH CONTRAST TECHNIQUE: Multidetector CT imaging of the chest, abdomen and pelvis was performed following the standard protocol during bolus administration of intravenous contrast. CONTRAST:  125mL ISOVUE-300 IOPAMIDOL (ISOVUE-300) INJECTION 61% COMPARISON:  09/25/2016 and prior abdominal and pelvic CTs. 10/20/2015 chest CT FINDINGS: CT CHEST FINDINGS Cardiovascular: Subtle pulmonary emboli within left lower lobe segmental arteries noted. RV to LV ratio is 1.0. There is no evidence of thoracic aortic aneurysm. No pericardial effusion identified. Mediastinum/Nodes: Multiple enlarged lymph nodes within the mediastinum, right supraclavicular and left hilar region are new since 10/20/2015. An index right peritracheal node measures 2.2 cm (image 21). Thyroid gland is unremarkable. No tracheal abnormality identified. Lungs/Pleura: Irregular linear and and slightly nodular opacity within both lungs noted, index areas measuring 1.8 cm in the left lung apex (image 36) within the right upper lobe (image 42 and 45). A 1.7 cm central of left lymph node/nodule is identified (image 59). No pleural effusion or pneumothorax. Musculoskeletal: No chest wall mass or suspicious bone lesions identified. CT ABDOMEN PELVIS FINDINGS Hepatobiliary: A new subtle 1 cm hypodense right hepatic lesion (image 52) is compatible with  metastasis. The patient is status post cholecystectomy with small amount of fluid in the cholecystectomy bed. There is no evidence of biliary dilatation. Pancreas: Unremarkable Spleen: Unremarkable Adrenals/Urinary Tract: Bilateral renal cortical atrophy noted. The adrenal glands are unremarkable. Soft tissue thickening along the inferior bladder has increased. Stomach/Bowel: Colonic surgical changes noted. Increasing moderate ill-defined soft tissue and nodules in the region of the anastomosis noted. This is compatible with malignancy and has increased since 09/25/2016. Increased stranding within the anterior mesenteric noted. Vascular/Lymphatic: Increasing lymphadenopathy within the retroperitoneum and mesenteric noted. An index left periaortic lymph node mass now measures 4.5 x 3.7 cm (image 76), previously less confluent and measuring 3.6 x 2.7 cm. There is no evidence of abdominal aortic aneurysm. Reproductive: Prostate enlargement again noted. Other: No ascites, bowel obstruction or pneumoperitoneum. A left lower quadrant ostomy is present. Musculoskeletal: No acute or suspicious bony abnormalities are identified. IMPRESSION: Left lower lobe pulmonary emboli with CT evidence of right heart strain (RV/LV Ratio = 1.0) consistent with at least submassive (intermediate risk) PE. The presence of right heart strain has been associated with an increased risk of morbidity  and mortality. Increasing malignancy and metastatic disease within the chest abdomen and pelvis as described. Multiple attempts to reach the on call provider have been unsuccessful as of 7 p.m., but will continue. Electronically Signed: By: Margarette Canada M.D. On: 11/23/2016 19:03        Scheduled Meds: . feeding supplement (ENSURE ENLIVE)  237 mL Oral TID BM  . [START ON 11/26/2016] fentaNYL  100 mcg Transdermal Q72H  . sodium chloride flush  3 mL Intravenous Q12H   Continuous Infusions: . sodium chloride    . heparin 1,500 Units/hr  (11/24/16 1237)     LOS: 1 day    Time spent: > 35 minutes  Velvet Bathe, MD [facility] Pager [phone]  If 7PM-7AM, please contact night-coverage [email or web address] Password [email or web address] 11/24/2016, 3:12 PM

## 2016-11-24 NOTE — Progress Notes (Signed)
ANTICOAGULATION CONSULT NOTE - Follow Up Consult  Pharmacy Consult for Heparin  Indication: pulmonary embolus  No Known Allergies  Patient Measurements: Height: 6\' 1"  (185.4 cm) Weight: 180 lb 1.6 oz (81.7 kg) IBW/kg (Calculated) : 79.9  Vital Signs: Temp: 99 F (37.2 C) (06/09 0100) Temp Source: Oral (06/09 0100) BP: 107/76 (06/09 0530) Pulse Rate: 93 (06/09 0530)  Labs:  Recent Labs  11/23/16 2045 11/24/16 0210 11/24/16 0815 11/24/16 1113  HGB 12.5*  --  11.8*  --   HCT 38.2*  --  36.3*  --   PLT 353  --  329  --   LABPROT 14.2  --   --   --   INR 1.10  --   --   --   HEPARINUNFRC  --  0.39  --  0.33  CREATININE 1.10  --  1.11  --   TROPONINI <0.03 <0.03 <0.03  --     Estimated Creatinine Clearance: 84 mL/min (by C-G formula based on SCr of 1.11 mg/dL).  Assessment: 77 yoM transferred from [facility] found to have new onset PE on CT in setting of metastatic colon cancer. Initial heparin level therapeutic but drawn ~4 hours post-bolus, second heparin level remains therapeutic at 0.33 after slight dose increase. CBC appears stable, no S/Sx bleeding noted.  Goal of Therapy:  Heparin level 0.3-0.7 units/ml Monitor platelets by anticoagulation protocol: Yes   Plan:  -Continue heparin 1500 units/hr -Monitor heparin level, CBC, S/Sx bleeding daily  Arrie Senate, PharmD PGY-1 Pharmacy Resident Pager: [phone] 11/24/2016

## 2016-11-24 NOTE — Progress Notes (Signed)
*  PRELIMINARY RESULTS* Echocardiogram 2D Echocardiogram with definity has been performed.  Leavy Cella 11/24/2016, 3:53 PM

## 2016-11-24 NOTE — Progress Notes (Addendum)
Initial Nutrition Assessment  DOCUMENTATION CODES:   Non-severe (moderate) malnutrition in context of chronic illness  INTERVENTION:    Continue Ensure Enlive po TID, each supplement provides 350 kcal and 20 grams of protein  NUTRITION DIAGNOSIS:   Malnutrition (moderate) related to catabolic illness (colon cancer) as evidenced by moderate depletion of body fat, moderate depletions of muscle mass, energy intake < 75% for > or equal to 1 month and 15% weight loss x 2 months  GOAL:   Patient will meet greater than or equal to 90% of their needs  MONITOR:   PO intake, Supplement acceptance, Labs, Weight trends, Skin, I & O's  REASON FOR ASSESSMENT:   Malnutrition Screening Tool  ASSESSMENT:   56 y.o. Male with medical history significant of recurrent colon cancer with mets comes in after having work up as outpatient for recurrence of his colon cancer. Oncology had set up a ct scan of his chest which has incidentally found a submassive PE.  RD spoke with wife at bedside. Reports pt had surgery (cholecystectomy, colectomy with colostomy creation) at the end of April 2018.   Since then he's been eating fruit, pudding, ice cream and drinking Boost nutrition supplements.  Wife states pt has also lost weight.  Believes it's been between 20 and 30 lbs. Per wt readings, pt has had a 15% weight loss since mid-April 2018.  Severe for time frame. Medications reviewed.  Labs reviewed.  Sodium 131 (L).  Nutrition-Focused physical exam completed. Findings are moderate fat depletion, modrate muscle depletion, and no edema.   Diet Order:  Diet Heart Room service appropriate? Yes; Fluid consistency: Thin  Skin:  Reviewed, no issues  Last BM:  6/9  Height:   Ht Readings from Last 1 Encounters:  11/24/16 6\' 1"  (1.854 m)   Weight:   Wt Readings from Last 1 Encounters:  11/24/16 180 lb 1.6 oz (81.7 kg)   Wt Readings from Last 10 Encounters:  11/24/16 180 lb 1.6 oz (81.7 kg)   11/02/16 196 lb 9.6 oz (89.2 kg)  11/01/16 198 lb (89.8 kg)  10/12/16 222 lb 3.2 oz (100.8 kg)  10/11/16 208 lb 6.4 oz (94.5 kg)  10/11/16 208 lb (94.3 kg)  10/04/16 212 lb (96.2 kg)  09/27/16 212 lb 11.2 oz (96.5 kg)  06/08/16 201 lb 11.2 oz (91.5 kg)  05/04/16 206 lb 4.8 oz (93.6 kg)   Ideal Body Weight:  81 kg  BMI:  Body mass index is 23.76 kg/m.  Estimated Nutritional Needs:   Kcal:  2200-2400  Protein:  110-120 gm  Fluid:  2.2-2.4 L  EDUCATION NEEDS:   No education needs identified at this time  Arthur Holms, RD, LDN Pager #: [phone] After-Hours Pager #: [phone]

## 2016-11-24 NOTE — Plan of Care (Signed)
Problem: Pain Managment: Goal: General experience of comfort will improve Outcome: Progressing Assess Pain Routine and PRN.

## 2016-11-24 NOTE — Progress Notes (Signed)
ANTICOAGULATION CONSULT NOTE - Follow Up Consult  Pharmacy Consult for Heparin  Indication: pulmonary embolus  No Known Allergies  Patient Measurements: Height: 6\' 1"  (185.4 cm) Weight: 180 lb 1.6 oz (81.7 kg) IBW/kg (Calculated) : 79.9  Vital Signs: Temp: 99 F (37.2 C) (06/09 0100) Temp Source: Oral (06/09 0100) BP: 127/80 (06/09 0100) Pulse Rate: 96 (06/09 0100)  Labs:  Recent Labs  11/23/16 2045 11/24/16 0210  HGB 12.5*  --   HCT 38.2*  --   PLT 353  --   LABPROT 14.2  --   INR 1.10  --   HEPARINUNFRC  --  0.39  CREATININE 1.10  --   TROPONINI <0.03 <0.03    Estimated Creatinine Clearance: 84.7 mL/min (by C-G formula based on SCr of 1.1 mg/dL).  Assessment: Tx from [facility] with new onset PE in setting of metastatic colon CA. Heparin level is 0.39 but drawn only 4 hours after bolus, would expect level to trend down to sub-therapeutic over the next few hours.   Goal of Therapy:  Heparin level 0.3-0.7 units/ml Monitor platelets by anticoagulation protocol: Yes   Plan:  -Inc heparin to 1500 units/hr to prevent sub-therapeutic level -1100 HL  Narda Bonds 11/24/2016,3:12 AM

## 2016-11-24 NOTE — Progress Notes (Signed)
Microbiology called at this time and states that MRSA PCR ordered didn't cross over in the computer. As pt is SD level of care, protocol dictates MRSA PCR is indicated on this pt and therefore was performed upon arrival to department. Previous order discontinued and is reordered at this time per microbiology departments request.

## 2016-11-25 DIAGNOSIS — E440 Moderate protein-calorie malnutrition: Secondary | ICD-10-CM | POA: Insufficient documentation

## 2016-11-25 LAB — HEPARIN LEVEL (UNFRACTIONATED): HEPARIN UNFRACTIONATED: 0.35 [IU]/mL (ref 0.30–0.70)

## 2016-11-25 LAB — CBC
HCT: 33.9 % — ABNORMAL LOW (ref 39.0–52.0)
Hemoglobin: 11 g/dL — ABNORMAL LOW (ref 13.0–17.0)
MCH: 26 pg (ref 26.0–34.0)
MCHC: 32.4 g/dL (ref 30.0–36.0)
MCV: 80.1 fL (ref 78.0–100.0)
PLATELETS: 289 10*3/uL (ref 150–400)
RBC: 4.23 MIL/uL (ref 4.22–5.81)
RDW: 14.7 % (ref 11.5–15.5)
WBC: 6.6 10*3/uL (ref 4.0–10.5)

## 2016-11-25 MED ORDER — RIVAROXABAN (XARELTO) EDUCATION KIT FOR DVT/PE PATIENTS
PACK | Freq: Once | Status: AC
  Administered 2016-11-25: 17:00:00
  Filled 2016-11-25: qty 1

## 2016-11-25 MED ORDER — RIVAROXABAN 20 MG PO TABS: 20.0000 mg | ORAL_TABLET | Freq: Every day | ORAL | Status: DC

## 2016-11-25 MED ORDER — RIVAROXABAN 15 MG PO TABS: 15.0000 mg | ORAL_TABLET | Freq: Two times a day (BID) | ORAL | Status: DC

## 2016-11-25 MED ORDER — RIVAROXABAN (XARELTO) EDUCATION KIT FOR DVT/PE PATIENTS: 1.0000 | PACK | Freq: Once | 0 refills | Status: AC

## 2016-11-25 MED ORDER — RIVAROXABAN (XARELTO) VTE STARTER PACK (15 & 20 MG): ORAL_TABLET | ORAL | 0 refills | Status: AC

## 2016-11-25 MED ORDER — HEPARIN SOD (PORK) LOCK FLUSH 100 UNIT/ML IV SOLN
500.0000 [IU] | INTRAVENOUS | Status: AC | PRN
  Administered 2016-11-25: 500 [IU]

## 2016-11-25 MED ORDER — RIVAROXABAN 15 MG PO TABS
15.0000 mg | ORAL_TABLET | Freq: Two times a day (BID) | ORAL | Status: DC
  Administered 2016-11-25: 15 mg via ORAL
  Filled 2016-11-25: qty 1

## 2016-11-25 NOTE — Discharge Summary (Signed)
Physician Discharge Summary  Derek Rodriguez ALP:[id number] DOB: 03/06/1961 DOA: 11/23/2016  PCP: [name], MD  Admit date: 11/23/2016 Discharge date: 11/25/2016  Time spent: > 35  minutes  Recommendations for Outpatient Follow-up:  1. Recommend patient f/u with oncology 2. 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 male with medical history significant of recurrent colon cancer with mets comes in after having work up as outpatient for recurrence of his colon cancer. Oncology had set up a ct scan of his chest which has incidentally found a submassive PE.  Hospital Course:  PE - Pulmonology consulted and no plans for lytic therapy - d/c on xarelto.  - Pt was on ~ 2 days of IV heparin  Colon cancer - pt to f/u with oncologist for further evaluation and recommendations  For known medical problems will continue medications listed below.  Procedures:  None  Consultations:  Critical care  Discharge Exam: Vitals:   11/25/16 0742 11/25/16 1207  BP: 109/77 116/85  Pulse: 94 98  Resp: 11 19  Temp: 98.3 F (36.8 C) 98.1 F (36.7 C)    General: Pt in nad, alert and awake Cardiovascular: no cyanosis Respiratory: no increased wob, no wheezes, equal chest rise.  Discharge Instructions   Discharge Instructions    Call MD for:  severe uncontrolled pain    Complete by:  As directed    Diet - low sodium heart healthy    Complete by:  As directed    Discharge instructions     Complete by:  As directed    Please be sure to follow up with your oncologist/hematologist after hospital discharge for further evaluation and recommendations.   Increase activity slowly    Complete by:  As directed      Current Discharge Medication List    START taking these medications   Details  rivaroxaban (XARELTO) KIT 1 kit by Does not apply route once. Qty: 1 kit, Refills: 0    Rivaroxaban 15 & 20 MG TBPK Take as directed on package: Start with one 74m tablet by mouth twice a day with food. On Day 22, switch to one 258mtablet once a day with food. Qty: 51 each, Refills: 0      CONTINUE these medications which have NOT CHANGED   Details  ALPRAZolam (XANAX) 1 MG tablet Take 1 tablet (1 mg total) by mouth 3 (three) times daily as needed for anxiety. Qty: 30 tablet, Refills: 0    cyclobenzaprine (FLEXERIL) 10 MG tablet Take 1 tablet (10 mg total) by mouth 3 (three) times daily as needed for muscle spasms. Qty: 30 tablet, Refills: 1    fentaNYL (DURAGESIC - DOSED MCG/HR) 100 MCG/HR Place 1 patch (100 mcg total) onto the skin every 3 (three) days. Qty: 10 patch, Refills: 0    gabapentin (NEURONTIN) 300 MG capsule Take 1 capsule at HS x 3 days, then 2 capsules at HS x 3 days, and  then 3 capsules at HS thereafter Qty: 90 capsule, Refills: 1   Associated Diagnoses: Drug-induced polyneuropathy (HCC)    HYDROmorphone (DILAUDID) 2 MG tablet Take 1 tablet (2 mg total) by mouth every 4 (four) hours as needed for severe pain. Qty: 60 tablet, Refills: 0    lidocaine-prilocaine (EMLA) cream Apply a quarter size amount to port site 1 hour prior to chemo. Do not rub in. Cover with plastic wrap. Qty: 30 g, Refills: 3   Associated Diagnoses: Adenocarcinoma of sigmoid colon (HCC)    omeprazole (PRILOSEC) 40 MG capsule Take 1 capsule (40 mg total) by mouth daily. Qty: 30 capsule, Refills: 5    senna-docusate (SENOKOT-S) 8.6-50 MG tablet Take 2 tablets by mouth at bedtime. Qty: 10  tablet, Refills: 0    zolpidem (AMBIEN) 10 MG tablet Take 1 tablet (10 mg total) by mouth at bedtime as needed for sleep. Qty: 30 tablet, Refills: 3   Associated Diagnoses: Adenocarcinoma of sigmoid colon ([place]); Insomnia due to medical condition    ondansetron (ZOFRAN-ODT) 4 MG disintegrating tablet Take 1 tablet (4 mg total) by mouth every 6 (six) hours as needed for nausea. Qty: 20 tablet, Refills: 1    oxyCODONE-acetaminophen (PERCOCET) 7.5-325 MG tablet Take 1 tablet by mouth every 6 (six) hours as needed for moderate pain or severe pain.  Refills: 0      STOP taking these medications     ondansetron (ZOFRAN) 4 MG tablet      amoxicillin-clavulanate (AUGMENTIN) 875-125 MG tablet        No Known Allergies    The results of significant diagnostics from this hospitalization (including imaging, microbiology, ancillary and laboratory) are listed below for reference.    Significant Diagnostic Studies: Ct Chest W Contrast  Addendum Date: 11/23/2016   ADDENDUM REPORT: 11/23/2016 19:44 ADDENDUM: Critical Value/emergent results were called by telephone on 11/23/2016 at 7:15 pm to [facility], who verbally acknowledged these results. Electronically Signed   By: Margarette Canada M.D.   On: 11/23/2016 19:44   Result Date: 11/23/2016 CLINICAL DATA:  56 year old male with abdominal and pelvic pain, nausea and weakness. History of metastatic colon adenocarcinoma. EXAM: CT CHEST, ABDOMEN, AND PELVIS WITH CONTRAST TECHNIQUE: Multidetector CT imaging of the chest, abdomen and pelvis was performed following the standard protocol during bolus administration of intravenous contrast. CONTRAST:  194m ISOVUE-300 IOPAMIDOL (ISOVUE-300) INJECTION 61% COMPARISON:  09/25/2016 and prior abdominal and pelvic CTs. 10/20/2015 chest CT FINDINGS: CT CHEST FINDINGS Cardiovascular: Subtle pulmonary emboli within left lower lobe segmental arteries noted. RV to LV ratio is 1.0. There is no evidence of thoracic aortic aneurysm.  No pericardial effusion identified. Mediastinum/Nodes: Multiple enlarged lymph nodes within the mediastinum, right supraclavicular and left hilar region are new since 10/20/2015. An index right peritracheal node measures 2.2 cm (image 21). Thyroid gland is unremarkable. No tracheal abnormality identified. Lungs/Pleura: Irregular linear and and slightly nodular opacity within both lungs noted, index areas measuring 1.8 cm in the left lung apex (image 36) within the right upper lobe (image 42 and 45). A 1.7 cm central of left lymph node/nodule is identified (image 59). No pleural effusion or pneumothorax. Musculoskeletal: No chest wall mass or suspicious bone lesions identified. CT ABDOMEN PELVIS FINDINGS Hepatobiliary: A new subtle 1 cm hypodense right hepatic lesion (image 52) is compatible with metastasis. The patient is status post cholecystectomy with small amount of fluid in the cholecystectomy bed. There is no evidence of biliary dilatation. Pancreas: Unremarkable Spleen: Unremarkable Adrenals/Urinary Tract: Bilateral renal cortical  atrophy noted. The adrenal glands are unremarkable. Soft tissue thickening along the inferior bladder has increased. Stomach/Bowel: Colonic surgical changes noted. Increasing moderate ill-defined soft tissue and nodules in the region of the anastomosis noted. This is compatible with malignancy and has increased since 09/25/2016. Increased stranding within the anterior mesenteric noted. Vascular/Lymphatic: Increasing lymphadenopathy within the retroperitoneum and mesenteric noted. An index left periaortic lymph node mass now measures 4.5 x 3.7 cm (image 76), previously less confluent and measuring 3.6 x 2.7 cm. There is no evidence of abdominal aortic aneurysm. Reproductive: Prostate enlargement again noted. Other: No ascites, bowel obstruction or pneumoperitoneum. A left lower quadrant ostomy is present. Musculoskeletal: No acute or suspicious bony abnormalities are identified.  IMPRESSION: Left lower lobe pulmonary emboli with CT evidence of right heart strain (RV/LV Ratio = 1.0) consistent with at least submassive (intermediate risk) PE. The presence of right heart strain has been associated with an increased risk of morbidity and mortality. Increasing malignancy and metastatic disease within the chest abdomen and pelvis as described. Multiple attempts to reach the on call provider have been unsuccessful as of 7 p.m., but will continue. Electronically Signed: By: Margarette Canada M.D. On: 11/23/2016 19:03   Ct Abdomen Pelvis W Contrast  Addendum Date: 11/23/2016   ADDENDUM REPORT: 11/23/2016 19:44 ADDENDUM: Critical Value/emergent results were called by telephone on 11/23/2016 at 7:15 pm to [facility], who verbally acknowledged these results. Electronically Signed   By: Margarette Canada M.D.   On: 11/23/2016 19:44   Result Date: 11/23/2016 CLINICAL DATA:  56 year old male with abdominal and pelvic pain, nausea and weakness. History of metastatic colon adenocarcinoma. EXAM: CT CHEST, ABDOMEN, AND PELVIS WITH CONTRAST TECHNIQUE: Multidetector CT imaging of the chest, abdomen and pelvis was performed following the standard protocol during bolus administration of intravenous contrast. CONTRAST:  139m ISOVUE-300 IOPAMIDOL (ISOVUE-300) INJECTION 61% COMPARISON:  09/25/2016 and prior abdominal and pelvic CTs. 10/20/2015 chest CT FINDINGS: CT CHEST FINDINGS Cardiovascular: Subtle pulmonary emboli within left lower lobe segmental arteries noted. RV to LV ratio is 1.0. There is no evidence of thoracic aortic aneurysm. No pericardial effusion identified. Mediastinum/Nodes: Multiple enlarged lymph nodes within the mediastinum, right supraclavicular and left hilar region are new since 10/20/2015. An index right peritracheal node measures 2.2 cm (image 21). Thyroid gland is unremarkable. No tracheal abnormality identified. Lungs/Pleura: Irregular linear and and slightly nodular opacity within both lungs  noted, index areas measuring 1.8 cm in the left lung apex (image 36) within the right upper lobe (image 42 and 45). A 1.7 cm central of left lymph node/nodule is identified (image 59). No pleural effusion or pneumothorax. Musculoskeletal: No chest wall mass or suspicious bone lesions identified. CT ABDOMEN PELVIS FINDINGS Hepatobiliary: A new subtle 1 cm hypodense right hepatic lesion (image 52) is compatible with metastasis. The patient is status post cholecystectomy with small amount of fluid in the cholecystectomy bed. There is no evidence of biliary dilatation. Pancreas: Unremarkable Spleen: Unremarkable Adrenals/Urinary Tract: Bilateral renal cortical atrophy noted. The adrenal glands are unremarkable. Soft tissue thickening along the inferior bladder has increased. Stomach/Bowel: Colonic surgical changes noted. Increasing moderate ill-defined soft tissue and nodules in the region of the anastomosis noted. This is compatible with malignancy and has increased since 09/25/2016. Increased stranding within the anterior mesenteric noted. Vascular/Lymphatic: Increasing lymphadenopathy within the retroperitoneum and mesenteric noted. An index left periaortic lymph node mass now measures 4.5 x 3.7 cm (image 76), previously less confluent and measuring 3.6 x 2.7 cm. There is no evidence  of abdominal aortic aneurysm. Reproductive: Prostate enlargement again noted. Other: No ascites, bowel obstruction or pneumoperitoneum. A left lower quadrant ostomy is present. Musculoskeletal: No acute or suspicious bony abnormalities are identified. IMPRESSION: Left lower lobe pulmonary emboli with CT evidence of right heart strain (RV/LV Ratio = 1.0) consistent with at least submassive (intermediate risk) PE. The presence of right heart strain has been associated with an increased risk of morbidity and mortality. Increasing malignancy and metastatic disease within the chest abdomen and pelvis as described. Multiple attempts to reach  the on call provider have been unsuccessful as of 7 p.m., but will continue. Electronically Signed: By: Margarette Canada M.D. On: 11/23/2016 19:03    Microbiology: Recent Results (from the past 240 hour(s))  MRSA PCR Screening     Status: None   Collection Time: 11/24/16  1:02 AM  Result Value Ref Range Status   MRSA by PCR NEGATIVE NEGATIVE Final    Comment:        The GeneXpert MRSA Assay (FDA approved for NASAL specimens only), is one component of a comprehensive MRSA colonization surveillance program. It is not intended to diagnose MRSA infection nor to guide or monitor treatment for MRSA infections.      Labs: Basic Metabolic Panel:  Recent Labs Lab 11/23/16 2045 11/24/16 0815  NA 133* 131*  K 4.5 3.5  CL 94* 94*  CO2 28 26  GLUCOSE 111* 89  BUN 14 11  CREATININE 1.10 1.11  CALCIUM 9.4 9.1   Liver Function Tests: No results for input(s): AST, ALT, ALKPHOS, BILITOT, PROT, ALBUMIN in the last 168 hours. No results for input(s): LIPASE, AMYLASE in the last 168 hours. No results for input(s): AMMONIA in the last 168 hours. CBC:  Recent Labs Lab 11/23/16 2045 11/24/16 0815 11/25/16 0500  WBC 8.1 8.1 6.6  NEUTROABS 5.7  --   --   HGB 12.5* 11.8* 11.0*  HCT 38.2* 36.3* 33.9*  MCV 80.3 80.1 80.1  PLT 353 329 289   Cardiac Enzymes:  Recent Labs Lab 11/23/16 2045 11/24/16 0210 11/24/16 0815 11/24/16 1330  TROPONINI <0.03 <0.03 <0.03 <0.03   BNP: BNP (last 3 results)  Recent Labs  01/30/16 1546 02/03/16 0915  BNP 396.0* 19.0    ProBNP (last 3 results) No results for input(s): PROBNP in the last 8760 hours.  CBG: No results for input(s): GLUCAP in the last 168 hours.     Signed:  Velvet Bathe MD.  [facility] 11/25/2016, 4:55 PM

## 2016-11-25 NOTE — Progress Notes (Signed)
ANTICOAGULATION CONSULT NOTE - Follow Up Consult  Pharmacy Consult for Heparin > Xarelto Indication: pulmonary embolus  No Known Allergies  Patient Measurements: Height: 6\' 1"  (185.4 cm) Weight: 180 lb 8 oz (81.9 kg) IBW/kg (Calculated) : 79.9  Vital Signs: Temp: 98.1 F (36.7 C) (06/10 1207) Temp Source: Oral (06/10 1207) BP: 116/85 (06/10 1207) Pulse Rate: 98 (06/10 1207)  Labs:  Recent Labs  11/23/16 2045 11/24/16 0210 11/24/16 0815 11/24/16 1113 11/24/16 1330 11/25/16 0500  HGB 12.5*  --  11.8*  --   --  11.0*  HCT 38.2*  --  36.3*  --   --  33.9*  PLT 353  --  329  --   --  289  LABPROT 14.2  --   --   --   --   --   INR 1.10  --   --   --   --   --   HEPARINUNFRC  --  0.39  --  0.33  --  0.35  CREATININE 1.10  --  1.11  --   --   --   TROPONINI <0.03 <0.03 <0.03  --  <0.03  --     Estimated Creatinine Clearance: 84 mL/min (by C-G formula based on SCr of 1.11 mg/dL).  Assessment: 12 yoM transferred from [facility] found to have new onset PE on CT in setting of metastatic colon cancer. Heparin level remains therapeutic this morning at 0.35, Hgb down slightly to 11.0, pltc wnl. No S/Sx bleeding noted.  Goal of Therapy:  Heparin level 0.3-0.7 units/ml Monitor platelets by anticoagulation protocol: Yes   Plan:  -Continue heparin 1500 units/hr -Monitor heparin level, CBC, S/Sx bleeding daily -Follow-up long-term anticoagulation plans  Arrie Senate, PharmD PGY-1 Pharmacy Resident Pager: [phone] 11/25/2016   ADDENDUM:  Pharmacy consulted to transition from heparin to Xarelto. Renal function stable wnl. No bleed documented.  Plan: D/c IV heparin at time of 1st Xarelto dose Xarelto 15mg  PO BID with meals x 21 days; then 20mg  PO Qsupper Monitor CBC, s/sx bleeding   Elicia Lamp, PharmD, BCPS Clinical Pharmacist 11/25/2016 3:49 PM

## 2016-11-25 NOTE — Progress Notes (Signed)
ANTICOAGULATION CONSULT NOTE - Follow Up Consult  Pharmacy Consult for Heparin  Indication: pulmonary embolus  No Known Allergies  Patient Measurements: Height: 6\' 1"  (185.4 cm) Weight: 180 lb 8 oz (81.9 kg) IBW/kg (Calculated) : 79.9  Vital Signs: Temp: 98.5 F (36.9 C) (06/10 0531) BP: 109/75 (06/10 0531) Pulse Rate: 96 (06/10 0531)  Labs:  Recent Labs  11/23/16 2045 11/24/16 0210 11/24/16 0815 11/24/16 1113 11/24/16 1330 11/25/16 0500  HGB 12.5*  --  11.8*  --   --  11.0*  HCT 38.2*  --  36.3*  --   --  33.9*  PLT 353  --  329  --   --  289  LABPROT 14.2  --   --   --   --   --   INR 1.10  --   --   --   --   --   HEPARINUNFRC  --  0.39  --  0.33  --  0.35  CREATININE 1.10  --  1.11  --   --   --   TROPONINI <0.03 <0.03 <0.03  --  <0.03  --     Estimated Creatinine Clearance: 84 mL/min (by C-G formula based on SCr of 1.11 mg/dL).  Assessment: 20 yoM transferred from [facility] found to have new onset PE on CT in setting of metastatic colon cancer. Heparin level remains therapeutic this morning at 0.35, Hgb down slightly to 11.0, pltc wnl. No S/Sx bleeding noted.  Goal of Therapy:  Heparin level 0.3-0.7 units/ml Monitor platelets by anticoagulation protocol: Yes   Plan:  -Continue heparin 1500 units/hr -Monitor heparin level, CBC, S/Sx bleeding daily -Follow-up long-term anticoagulation plans  Arrie Senate, PharmD PGY-1 Pharmacy Resident Pager: [phone] 11/25/2016

## 2016-11-25 NOTE — Care Management Note (Addendum)
Case Management Note  Patient Details  Name: DEZ STAUFFER MRN: [id number] Date of Birth: [birth year]  Subjective/Objective:                 Spoke with wife and spouse at bedside. Explained DOAC coupons. Suggested they take coupon along with paper Rx to [facility] as they always have these meds in stock and are very familiar with coupons on their way home because patient would need to start meds either tonight or tomorrow morning and it is important not to skip any doses. Hey vernbalized understanding. They state he follows with [facility] for [facility] RN, ostomy care pta. CM placed resumption of services order and faxed referral to [facility]  Bedside RN given and explained DOAC coupons. She will pass coupons along to patient for either Eliquis or Xaralto depending on what is Rx'ed at DC.    Action/Plan:  DC to home resuming HH.   Expected Discharge Date:                  Expected Discharge Plan:  [place]  In-House Referral:     Discharge planning Services  CM Consult, Medication Assistance  Post Acute Care Choice:  Home Health Choice offered to:  Patient, Spouse  DME Arranged:    DME Agency:     HH Arranged:  RN [place] Agency:  [facility]  Status of Service:  Completed, signed off  If discussed at [place] of Stay Meetings, dates discussed:    Additional Comments:  Carles Collet, RN 11/25/2016, 12:15 PM

## 2016-11-27 ENCOUNTER — Ambulatory Visit (HOSPITAL_COMMUNITY): Payer: BLUE CROSS/BLUE SHIELD

## 2016-11-27 ENCOUNTER — Encounter (HOSPITAL_COMMUNITY): Payer: BLUE CROSS/BLUE SHIELD

## 2016-11-27 NOTE — Telephone Encounter (Signed)
Spoke with Misty at [facility] and answered her questions.

## 2016-11-30 ENCOUNTER — Encounter (HOSPITAL_COMMUNITY): Payer: BLUE CROSS/BLUE SHIELD

## 2016-11-30 ENCOUNTER — Ambulatory Visit (HOSPITAL_COMMUNITY): Payer: BLUE CROSS/BLUE SHIELD

## 2016-12-03 ENCOUNTER — Encounter (HOSPITAL_COMMUNITY): Payer: BLUE CROSS/BLUE SHIELD | Attending: Oncology | Admitting: Oncology

## 2016-12-03 ENCOUNTER — Encounter (HOSPITAL_COMMUNITY): Payer: Self-pay

## 2016-12-03 ENCOUNTER — Encounter (HOSPITAL_COMMUNITY): Payer: BLUE CROSS/BLUE SHIELD

## 2016-12-03 VITALS — BP 103/75 | HR 100 | Temp 97.8°F | Resp 20 | Ht 72.0 in | Wt 178.3 lb

## 2016-12-03 DIAGNOSIS — R319 Hematuria, unspecified: Secondary | ICD-10-CM

## 2016-12-03 DIAGNOSIS — C187 Malignant neoplasm of sigmoid colon: Secondary | ICD-10-CM

## 2016-12-03 DIAGNOSIS — C189 Malignant neoplasm of colon, unspecified: Secondary | ICD-10-CM

## 2016-12-03 MED ORDER — ALPRAZOLAM 1 MG PO TABS: 1.0000 mg | ORAL_TABLET | Freq: Three times a day (TID) | ORAL | 0 refills | Status: AC | PRN

## 2016-12-03 MED ORDER — RIVAROXABAN 20 MG PO TABS: 20.0000 mg | ORAL_TABLET | Freq: Every day | ORAL | 1 refills | Status: AC

## 2016-12-03 MED ORDER — HYDROMORPHONE HCL 2 MG PO TABS: 2.0000 mg | ORAL_TABLET | ORAL | 0 refills | Status: AC | PRN

## 2016-12-03 NOTE — Progress Notes (Signed)
[name], MD [address] [place] [address]   Malignant neoplasm of colon, unspecified part of colon [facility])  CURRENT THERAPY: Surveillance per NCCN guidelines  INTERVAL HISTORY: Derek Rodriguez 56 y.o. 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 Arnoldo Morale.    Malignant neoplasm of colon ([place])   09/09/2015 - 09/12/2015 Hospital Admission    Diverticulitis of colon with perforation      09/12/2015 Imaging    CT abd/pelvis- Continued severe inflammation of the proximal half of the sigmoid colon. There is a 2.6 cm oval low-attenuation structure extending between the posterior wall of the inflamed sigmoid colon and the dome of the bladder....      09/27/2015 Tumor Marker    CEA: 2.5       10/03/2015 Procedure    Partial colectomy, splenic flexure takedown by Dr. Arnoldo Morale      10/03/2015 Pathology Results    FULL THICKNESS INVASIVE MODERATELY TO POORLY DIFFERENTIATED ADENOCARCINOMA WITH SURFACE ULCERATION. TUMOR INVADES THROUGH MUSCULARIS MUCOSA AND PERFORATES SEROSAL SURFACE. - TUMOR MEASURES 10.3 CM IN GREATEST DIMENSION. - EXTENSIVE LYMPH/VASCULAR INVASI      10/13/2015 Pathology Results    MSI STABLE      10/20/2015 Imaging    CT chest- Negative. No evidence of metastatic disease or other active disease within the thorax.      11/04/2015 - 11/04/2015 Chemotherapy    CapeOX      11/12/2015 Miscellaneous    ED visit with nausea and decreased po intake      11/18/2015 Procedure    Port-A-Cath insertion by Dr. Arnoldo Morale      11/25/2015 Treatment Plan Change    From Xelox to FOLFOX      11/25/2015 - 05/04/2016 Chemotherapy    FOLFOX x 11 cycles (after 1 cycle of CAPEOX) with significant complications throughout treatment      01/06/2016 Treatment Plan Change    Oxaliplatin dose reduced by 20% for cycle #4 and 5FU bolus was discontinued.       04/05/2016 Imaging    CT abd/pelvis- Patient  status post interval sigmoid colectomy. Anastomosis appears intact. Slightly anterior to the sigmoid colon is a small soft tissue nodule which may be postsurgical in etiology. Recommend attention on followup.  Interval decrease in size of preaortic lymph node.      09/25/2016 Imaging    CT abd/pelvis- 1. Distended gallbladder with thickened wall and pericholecystic strandiness worrisome for acute cholecystitis. Correlate clinically. 2. Enlarging soft tissue mass adjacent to the area of the anastomosis of the rectosigmoid colon worrisome for recurrence of tumor. She adjacent adenopathy with retroperitoneal adenopathy noted as well. 3. Higher attenuation within the superior aspect of the urinary bladder to the right of midline worrisome for tumor. 4. There are a few small soft tissue nodules in the peritoneal cavity worrisome for peritoneal spread of disease.      09/25/2016 -  Hospital Admission    Admit date: 09/25/2016 Admission diagnosis: Acute cholecystitis Additional comments: Imaging of abd/pelvis also reveals findings concerning for metastatic/recurrence of disease.  CEA is elevated too.      09/27/2016 Relapse/Recurrence    Last Treatment Date: 05/04/2016 Radiographic findings concerning for recurrence of disease.  CEA elevated.       10/12/2016 Procedure    Postop Diagnosis:  Same, unresectable colonic mass involving left lower quadrant, obstructing colon and distal small bowel, extension into pelvic wall  and probable left pelvic retroperitoneal space (frozen pelvis)   Procedure:  Partial colectomy with end colostomy (Hartman's procedure ), cholecystectomy, enteroenterostomy   (palliative procedures)      11/20/2016 Imaging    CT chest/abd/pelvis Study Result   CLINICAL DATA:  56 year old male with abdominal and pelvic pain, nausea and weakness. History of metastatic colon adenocarcinoma.  EXAM: CT CHEST, ABDOMEN, AND PELVIS WITH  CONTRAST  TECHNIQUE: Multidetector CT imaging of the chest, abdomen and pelvis was performed following the standard protocol during bolus administration of intravenous contrast.  CONTRAST:  166m ISOVUE-300 IOPAMIDOL (ISOVUE-300) INJECTION 61%  COMPARISON:  09/25/2016 and prior abdominal and pelvic CTs. 10/20/2015 chest CT  FINDINGS: CT CHEST FINDINGS  Cardiovascular: Subtle pulmonary emboli within left lower lobe segmental arteries noted. RV to LV ratio is 1.0. There is no evidence of thoracic aortic aneurysm. No pericardial effusion identified.  Mediastinum/Nodes: Multiple enlarged lymph nodes within the mediastinum, right supraclavicular and left hilar region are new since 10/20/2015. An index right peritracheal node measures 2.2 cm (image 21). Thyroid gland is unremarkable. No tracheal abnormality identified.  Lungs/Pleura: Irregular linear and and slightly nodular opacity within both lungs noted, index areas measuring 1.8 cm in the left lung apex (image 36) within the right upper lobe (image 42 and 45). A 1.7 cm central of left lymph node/nodule is identified (image 59). No pleural effusion or pneumothorax.  Musculoskeletal: No chest wall mass or suspicious bone lesions identified.  CT ABDOMEN PELVIS FINDINGS  Hepatobiliary: A new subtle 1 cm hypodense right hepatic lesion (image 52) is compatible with metastasis. The patient is status post cholecystectomy with small amount of fluid in the cholecystectomy bed. There is no evidence of biliary dilatation.  Pancreas: Unremarkable  Spleen: Unremarkable  Adrenals/Urinary Tract: Bilateral renal cortical atrophy noted. The adrenal glands are unremarkable. Soft tissue thickening along the inferior bladder has increased.  Stomach/Bowel: Colonic surgical changes noted. Increasing moderate ill-defined soft tissue and nodules in the region of the anastomosis noted. This is compatible with malignancy and has  increased since 09/25/2016. Increased stranding within the anterior mesenteric noted.  Vascular/Lymphatic: Increasing lymphadenopathy within the retroperitoneum and mesenteric noted. An index left periaortic lymph node mass now measures 4.5 x 3.7 cm (image 76), previously less confluent and measuring 3.6 x 2.7 cm. There is no evidence of abdominal aortic aneurysm.  Reproductive: Prostate enlargement again noted.  Other: No ascites, bowel obstruction or pneumoperitoneum. A left lower quadrant ostomy is present.  Musculoskeletal: No acute or suspicious bony abnormalities are identified.  IMPRESSION: Left lower lobe pulmonary emboli with CT evidence of right heart strain (RV/LV Ratio = 1.0) consistent with at least submassive (intermediate risk) PE. The presence of right heart strain has been associated with an increased risk of morbidity and mortality.  Increasing malignancy and metastatic disease within the chest abdomen and pelvis as described.         He is accompanied by his wife today. Since his last visit he had restaging CT chest/abdomen/pelvis which demonstrated a left lower lobe PE as well as worsening of his metastatic disease with more disease in the chest, abdomen, and pelvis. He was admitted into the hospital and received heparin with transition to Xarelto at the time history discharged on 11/25/16. Patient states that for the past week he has been having hematuria with dysuria. Continues to have abdominal pain around his colostomy site. He states that he currently does not eat much and basically drinks 3 ensures a day for nutrition. He does  not do many of his own ADLs. He spends most of his time lying down or sleeping.   Review of Systems  Constitutional: Positive for malaise/fatigue. Negative for chills, fever and weight loss.  HENT: Negative for hearing loss.   Eyes: Negative.   Respiratory: Negative.  Negative for cough.   Cardiovascular: Negative.  Negative  for chest pain.  Gastrointestinal: Positive for abdominal pain.  Genitourinary: Positive for hematuria.  Musculoskeletal: Negative.   Skin: Negative.   Neurological: Positive for sensory change (tingling of fingertips) and weakness.  Endo/Heme/Allergies: Negative.   Psychiatric/Behavioral: Positive for depression. The patient has insomnia.     Past Medical History:  Diagnosis Date  . Adenocarcinoma of sigmoid colon ([place]) 10/18/2015  . Anxiety   . Arthritis   . Diverticulitis   . GERD (gastroesophageal reflux disease)   . Hypertension   . Neuropathy     Past Surgical History:  Procedure Laterality Date  . BIOPSY  02/06/2016   Procedure: BIOPSY;  Surgeon: Daneil Dolin, MD;  [facility];  Service: Endoscopy;;  esophagus  . BIOPSY  10/09/2016   Procedure: BIOPSY;  Surgeon: Aviva Signs, MD;  Location: [facility];  Service: Gastroenterology;;  sigmoid  . CHOLECYSTECTOMY N/A 10/12/2016   Procedure: CHOLECYSTECTOMY;  Surgeon: Aviva Signs, MD;  Location: [facility];  Service: General;  Laterality: N/A;  . COLECTOMY WITH COLOSTOMY CREATION/HARTMANN PROCEDURE N/A 10/12/2016   Procedure: COLECTOMY WITH COLOSTOMY CREATION/HARTMANN PROCEDURE (PALIATIVE) PARTIAL COLECTOMY WITH END OSTOMY CREATION SMALL BOWEL BYPASS;  Surgeon: Aviva Signs, MD;  Location: [facility];  Service: General;  Laterality: N/A;  . COLONOSCOPY N/A 10/09/2016   Procedure: COLONOSCOPY;  Surgeon: Aviva Signs, MD;  [facility];  Service: Gastroenterology;  Laterality: N/A;  . ESOPHAGEAL DILATION  02/06/2016   Procedure: ESOPHAGEAL DILATION;  Surgeon: Daneil Dolin, MD;  [facility];  Service: Endoscopy;;  . ESOPHAGOGASTRODUODENOSCOPY N/A 02/06/2016   Procedure: ESOPHAGOGASTRODUODENOSCOPY (EGD);  Surgeon: Daneil Dolin, MD;  [facility];  Service: Endoscopy;  Laterality: N/A;  115  . PARTIAL COLECTOMY N/A 10/03/2015   Procedure: PARTIAL COLECTOMY;  Surgeon: Aviva Signs, MD;  Location:  [facility];  Service: General;  Laterality: N/A;  . PORTACATH PLACEMENT N/A 11/18/2015   Procedure: INSERTION PORT-A-CATH LEFT SUBCLAVIAN;  Surgeon: Aviva Signs, MD;  Location: [facility];  Service: General;  Laterality: N/A;    Family History  Problem Relation Age of Onset  . Diabetes Father     Social History   Social History  . Marital status: Married    Spouse name: N/A  . Number of children: N/A  . Years of education: N/A   Social History Main Topics  . Smoking status: Former Smoker    Types: 27, Pipe    Quit date: 09/26/1985  . Smokeless tobacco: Former Systems developer    Types: Snuff    Quit date: 06/29/2015     Comment: 2-3 times weekly  . Alcohol use No     Comment: occ  . Drug use: No  . Sexual activity: Yes    Birth control/ protection: None   Other Topics Concern  . None   Social History Narrative  . None     PHYSICAL EXAMINATION  ECOG PERFORMANCE STATUS: 2 - Symptomatic, <50% confined to bed  Vitals:   12/03/16 1322  BP: 103/75  Pulse: 100  Resp: 20  Temp: 97.8 F (36.6 C)    Physical Exam  Constitutional: He is oriented to person,  place, and time and well-developed, well-nourished, and in no distress. No distress.  HENT:  Head: Normocephalic and atraumatic.  Left Ear: External ear normal.  Mouth/Throat: Oropharynx is clear and moist. No oropharyngeal exudate.  Eyes: Conjunctivae and EOM are normal. Pupils are equal, round, and reactive to light. No scleral icterus.  Neck: Normal range of motion. Neck supple. No JVD present.  Cardiovascular: Normal rate, regular rhythm and normal heart sounds.  Exam reveals no gallop and no friction rub.   No murmur heard. Pulmonary/Chest: Effort normal and breath sounds normal. No respiratory distress. He has no wheezes. He has no rales.  Port in place  Abdominal: Soft. Bowel sounds are normal. He exhibits no distension. There is tenderness. There is no guarding.  Musculoskeletal: Normal range of motion. He exhibits no  edema or tenderness.  Lymphadenopathy:    He has no cervical adenopathy.  Neurological: He is alert and oriented to person, place, and time. No cranial nerve deficit. Gait normal.  Skin: Skin is warm and dry. No rash noted. No erythema. No pallor.  Psychiatric: Affect and judgment normal.  Nursing note and vitals reviewed.   LABORATORY DATA: I have reviewed the data as listed. CBC    Component Value Date/Time   WBC 6.6 11/25/2016 0500   RBC 4.23 11/25/2016 0500   HGB 11.0 (L) 11/25/2016 0500   HCT 33.9 (L) 11/25/2016 0500   PLT 289 11/25/2016 0500   MCV 80.1 11/25/2016 0500   MCH 26.0 11/25/2016 0500   MCHC 32.4 11/25/2016 0500   RDW 14.7 11/25/2016 0500   LYMPHSABS 1.3 11/23/2016 2045   MONOABS 0.9 11/23/2016 2045   EOSABS 0.2 11/23/2016 2045   BASOSABS 0.0 11/23/2016 2045      Chemistry      Component Value Date/Time   NA 131 (L) 11/24/2016 0815   K 3.5 11/24/2016 0815   CL 94 (L) 11/24/2016 0815   CO2 26 11/24/2016 0815   BUN 11 11/24/2016 0815   CREATININE 1.11 11/24/2016 0815      Component Value Date/Time   CALCIUM 9.1 11/24/2016 0815   ALKPHOS 57 10/13/2016 0611   AST 53 (H) 10/13/2016 0611   ALT 31 10/13/2016 0611   BILITOT 1.4 (H) 10/13/2016 0611     Lab Results  Component Value Date   CEA 10.2 (H) 10/11/2016   PATHOLOGY: Surgical Path 10/12/16 1. Gallbladder METASTATIC ADENOCARCINOMA INVOLVING GALLBLADDER WALL 2. Colon, segmental resection, descending POORLY DIFFERENTIATED ADENOCARCINOMA DIFFUSELY INVOLVING PERI COLONIC SUBSEROSA MARGINS OF RESECTION ARE POSITIVE FOR ADENOCARCINOMA METASTATIC ADENOCARCINOMA IN ONE OF TWO LYMPH NODES (1/2)  RADIOLOGY  CT chest/abd/pelvis 11/20/16 Study Result   CLINICAL DATA:  56 year old male with abdominal and pelvic pain, nausea and weakness. History of metastatic colon adenocarcinoma.  EXAM: CT CHEST, ABDOMEN, AND PELVIS WITH CONTRAST  TECHNIQUE: Multidetector CT imaging of the chest, abdomen and  pelvis was performed following the standard protocol during bolus administration of intravenous contrast.  CONTRAST:  176m ISOVUE-300 IOPAMIDOL (ISOVUE-300) INJECTION 61%  COMPARISON:  09/25/2016 and prior abdominal and pelvic CTs. 10/20/2015 chest CT  FINDINGS: CT CHEST FINDINGS  Cardiovascular: Subtle pulmonary emboli within left lower lobe segmental arteries noted. RV to LV ratio is 1.0. There is no evidence of thoracic aortic aneurysm. No pericardial effusion identified.  Mediastinum/Nodes: Multiple enlarged lymph nodes within the mediastinum, right supraclavicular and left hilar region are new since 10/20/2015. An index right peritracheal node measures 2.2 cm (image 21). Thyroid gland is unremarkable. No tracheal abnormality identified.  Lungs/Pleura:  Irregular linear and and slightly nodular opacity within both lungs noted, index areas measuring 1.8 cm in the left lung apex (image 36) within the right upper lobe (image 42 and 45). A 1.7 cm central of left lymph node/nodule is identified (image 59). No pleural effusion or pneumothorax.  Musculoskeletal: No chest wall mass or suspicious bone lesions identified.  CT ABDOMEN PELVIS FINDINGS  Hepatobiliary: A new subtle 1 cm hypodense right hepatic lesion (image 52) is compatible with metastasis. The patient is status post cholecystectomy with small amount of fluid in the cholecystectomy bed. There is no evidence of biliary dilatation.  Pancreas: Unremarkable  Spleen: Unremarkable  Adrenals/Urinary Tract: Bilateral renal cortical atrophy noted. The adrenal glands are unremarkable. Soft tissue thickening along the inferior bladder has increased.  Stomach/Bowel: Colonic surgical changes noted. Increasing moderate ill-defined soft tissue and nodules in the region of the anastomosis noted. This is compatible with malignancy and has increased since 09/25/2016. Increased stranding within the anterior  mesenteric noted.  Vascular/Lymphatic: Increasing lymphadenopathy within the retroperitoneum and mesenteric noted. An index left periaortic lymph node mass now measures 4.5 x 3.7 cm (image 76), previously less confluent and measuring 3.6 x 2.7 cm. There is no evidence of abdominal aortic aneurysm.  Reproductive: Prostate enlargement again noted.  Other: No ascites, bowel obstruction or pneumoperitoneum. A left lower quadrant ostomy is present.  Musculoskeletal: No acute or suspicious bony abnormalities are identified.  IMPRESSION: Left lower lobe pulmonary emboli with CT evidence of right heart strain (RV/LV Ratio = 1.0) consistent with at least submassive (intermediate risk) PE. The presence of right heart strain has been associated with an increased risk of morbidity and mortality.  Increasing malignancy and metastatic disease within the chest abdomen and pelvis as described.     ASSESSMENT AND PLAN:  1. Stage IV colon CA. CT imaging concerning for recurrence of disease (in addition to rising CEA)  from Stage IIIC [facility]) adenocarcinoma of sigmoid colon cancer, MSI STABLE, moderate to poorly differentiated, invading through the muscularis mucosa and perforating the serosal surface, measuring 10.3 CM in largest dimension, extensive LVI, 11/12 positive lymph nodes, extracapsular extension is present, positive tumor satellite nodules are noted. S/P partial colectomy by Dr. Arnoldo Morale on 10/03/2015. Followed by 6 months of adjuvant chemotherapy initially treatment with CAPEOX x 1 cycle followed by FOLFOX x 11 cycles with last treatment on 05/04/2016 (requiring significant dose reductions and complicated by significant intolerance/depression).  Throughout treatment discussion regarding de-bulking surgery and HIPEC at [facility] took place repeatedly, but the patient declined ("I am not having any more surgery).   Patient presented in April 2018 with abdominal pain. CT scan of abd/pelvis  demonstrated acute cholecystitis as well as recurrence of his cancer at the anastomosis site, abd/pelvic lymphadenopathy, and likely peritoneal spread of his malignancy with soft tissue nodules seen in the peritoneal cavity. Status post partial colectomy with colostomy, cholecystectomy for metastatic recurrent colon cancer  2. Left lower lobe PE dx 11/20/16 on xarelto  PLAN: I have reviewed patient's CT chest/abdomen/pelvis in detail with him and his wife. His metastatic disease has worsened in the interval since his last scan. Patient's performance status is declining as well. I believe he willl be a poor candidate for chemotherapy since he tolerated his previous chemotherapy so poorly and his poor nutritional status and overall poor ECOG. Overall prognosis is very poor given his heavy disease burden. After discussions about goals of care, patient has elected to go with home hospice. Therefore referral for home hospice will  be made today.  As for his hematuria, I suspect this is secondary to bleeding from metastasis of his cancer to his bladder. His bleeding is exacerbated by anticoagulation was Xarelto. Therefore I have told him to hold his Xarelto for the next few days until his hematuria improves, at which time he can resume his Xarelto. I have sent in a prescription for Xarelto 20 mg by mouth daily. Anticoagulate for 6 months with xarelto.  I have refilled his Dilaudid when necessary and his Xanax when necessary as well.  I will turn over to hospice. Return to clinic when necessary.  THERAPY PLAN:  NCCN guidelines for surveillance for Colon cancer are as follows (1.2017):  A. Stage I   1. Colonoscopy at year 1    A. If advanced adenoma, repeat in 1 year    B. If no advanced adenoma, repeat in 3 years, and then every 5 years.  B. Stage II, Stage III   1. H+P every 3-6 months x 2 years and then every 6 months for a total of 5 years    2. CEA every 3-6 months x 2 years and then every 6 months  for a total of 5 years    3. CT CAP every 6-12 months (category 2B for frequency < 12 months) for a total of 5 years .   4.  Colonoscopy in 1 year except if no preoperative colonoscopy due to obstructing lesion, colonoscopy in 3-6 months.     A. If advanced adenoma, repeat in 1 year    B. If no advanced adenoma, repeat in 3 years, then every 5 years   5. PET/CT scan is not recommended.  C. Stage IV   1. H+P every 3-6 months x 2 years and then every 6 months for a total of 5 years    2. CEA every 3 months x 2 years and then every 6 months for a total of 3- 5 years    3. CT CAP every 3-6 months (category 2B for frequency < 6 months) x 2 years., then every 6-12 months for a total of 5 years .   4. Colonoscopy in 1 year except if no preoperative colonoscopy due to obstructing lesion, colonoscopy in 3-6 months.     A. If advanced adenoma, repeat in 1 year    B. If no advanced adenoma, repeat in 3 years, then every 5 years   All questions were answered. The patient knows to call the clinic with any problems, questions or concerns. We can certainly see the patient much sooner if necessary.  This document serves as a record of services personally performed by Twana First, MD. It was created on her behalf by Arlyce Harman, a trained medical scribe. The creation of this record is based on the scribe's personal observations and the provider's statements to them. This document has been checked and approved by the attending provider.  This note is electronically signed by: Twana First, MD 12/03/2016 4:04 PM

## 2017-02-12 ENCOUNTER — Telehealth (HOSPITAL_COMMUNITY): Payer: Self-pay | Admitting: *Deleted

## 2017-02-16 DEATH — deceased

## 2017-07-25 IMAGING — CT CT ABD-PELV W/ CM
3 of 5 series · 13 of 36 positions shown, 15 images · IV contrast (iopamidol)
Comparison: 09/25/2016 and prior abdominal and pelvic CTs.
10/20/2015 chest CT

ADDENDUM:
Critical Value/emergent results were called by telephone on 11/23/2016
at [DATE] to Blain Jumper, who verbally acknowledged these results.
CLINICAL DATA: 55-year-old male with abdominal and pelvic pain,
nausea and weakness. History of metastatic colon adenocarcinoma.

EXAM:
CT CHEST, ABDOMEN, AND PELVIS WITH CONTRAST
TECHNIQUE: Multidetector CT imaging of the chest, abdomen and pelvis was
performed following the standard protocol during bolus
administration of intravenous contrast.
CONTRAST:  100mL 0CAPXI-REE IOPAMIDOL (0CAPXI-REE) INJECTION 61%

[Series 2: cap with · axial · 0.81mm/px · z∈[-454,+71]mm · 8 of 137 slices shown, 10 images]
[im 16/137  mediastinal]
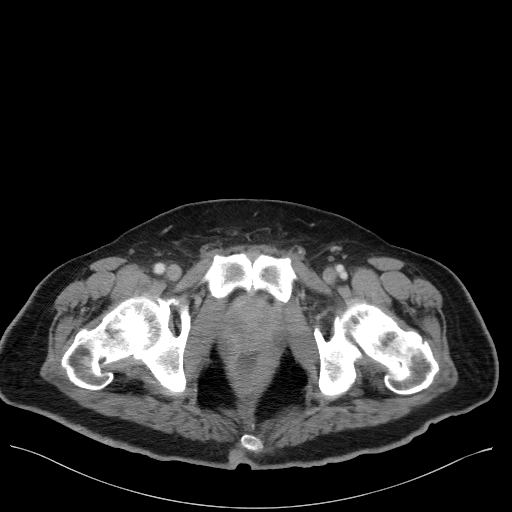
[im 16/137  lung]
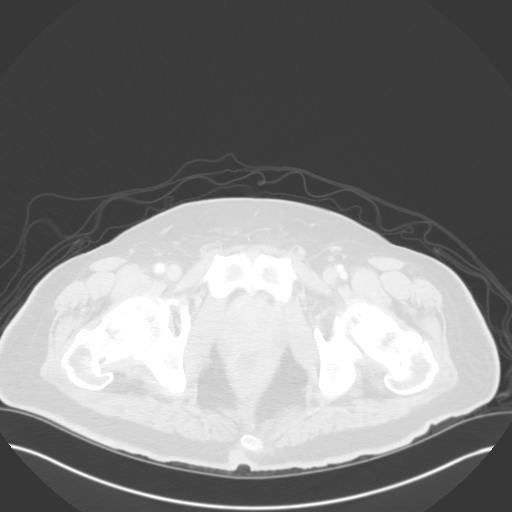
[im 31/137  lung]
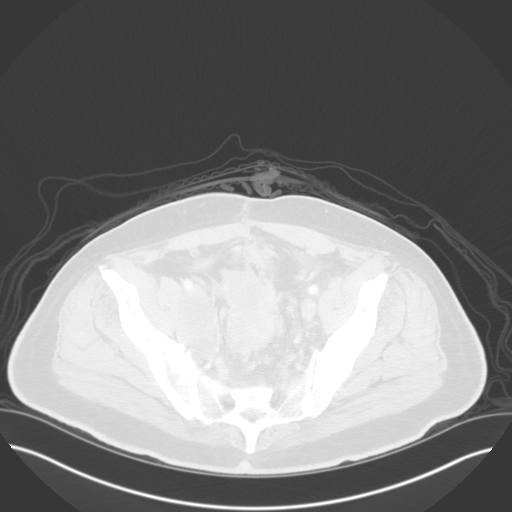
[im 46/137  lung]
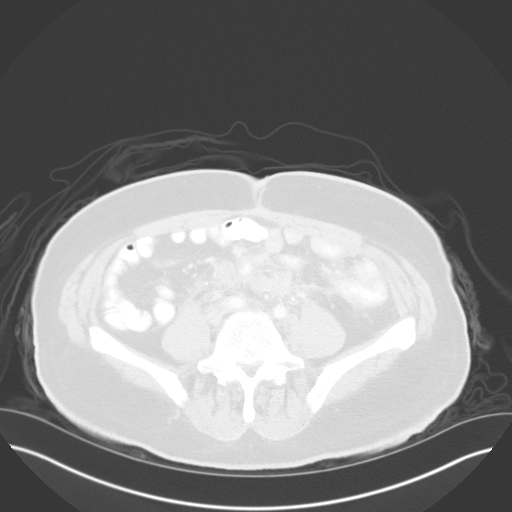
[im 61/137  lung]
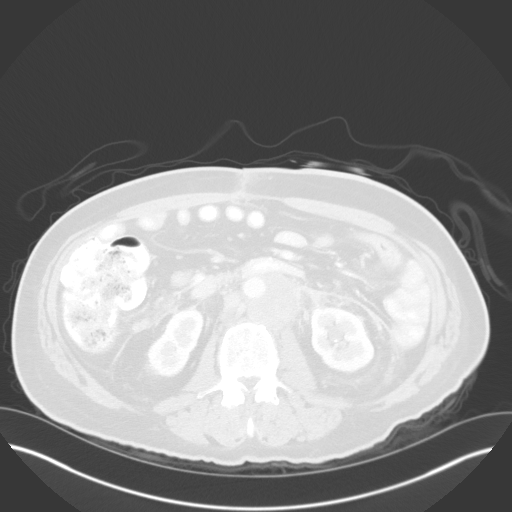
[im 76/137  mediastinal]
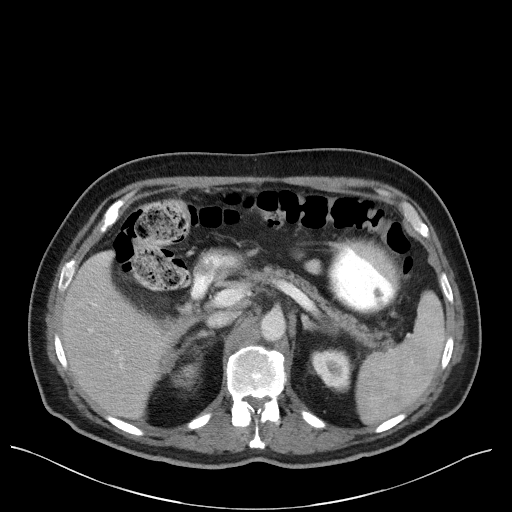
[im 76/137  lung]
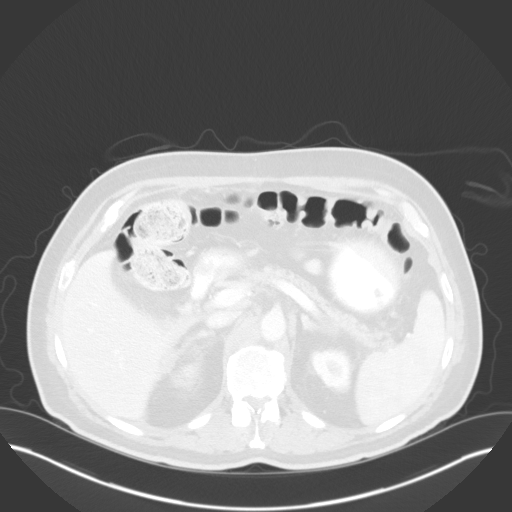
[im 91/137  lung]
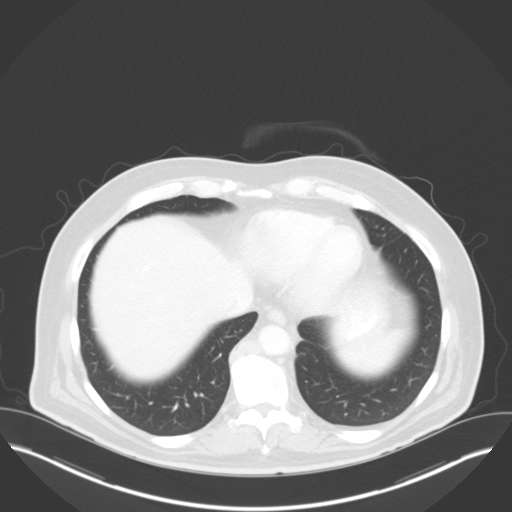
[im 106/137  lung]
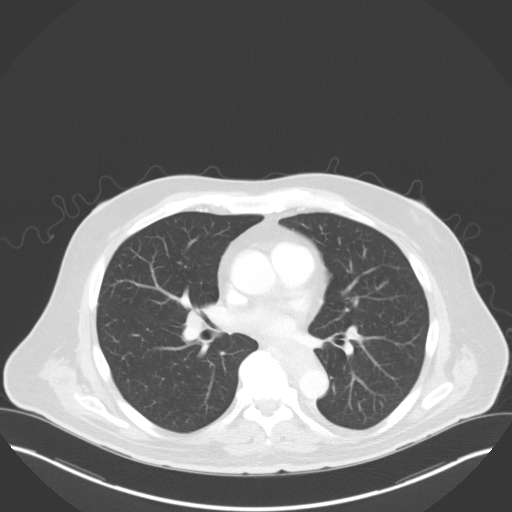
[im 121/137  lung]
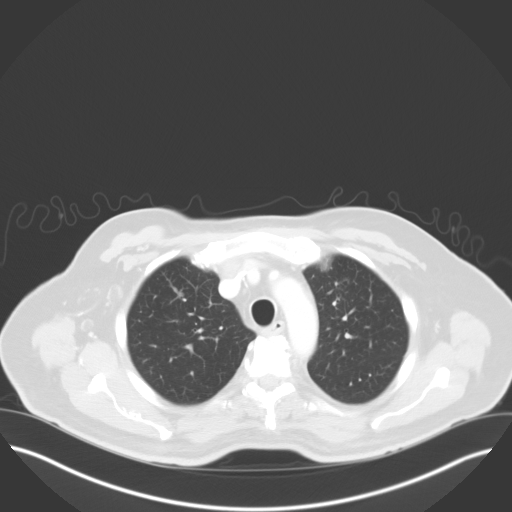

[Series 3: lung · axial · 0.80mm/px · z∈[-167,-135]mm · 2 of 175 slices shown]
[im 16/175  lung]
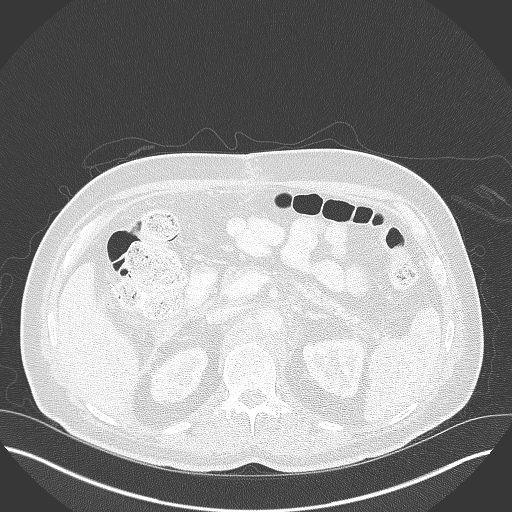
[im 32/175  lung]
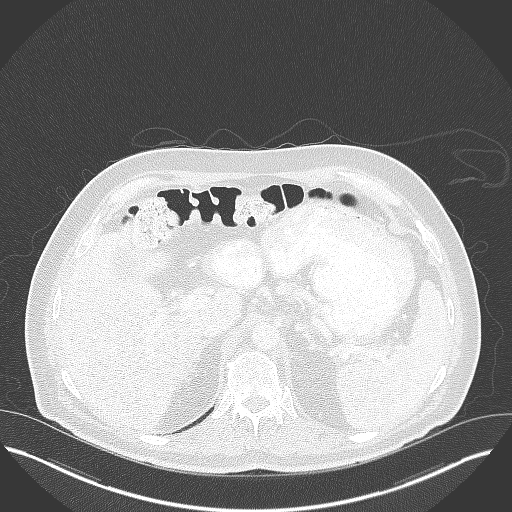

[Series 4: coronals · coronal · 0.76mm/px · 3 of 135 slices shown]
[im 27/135  lung]
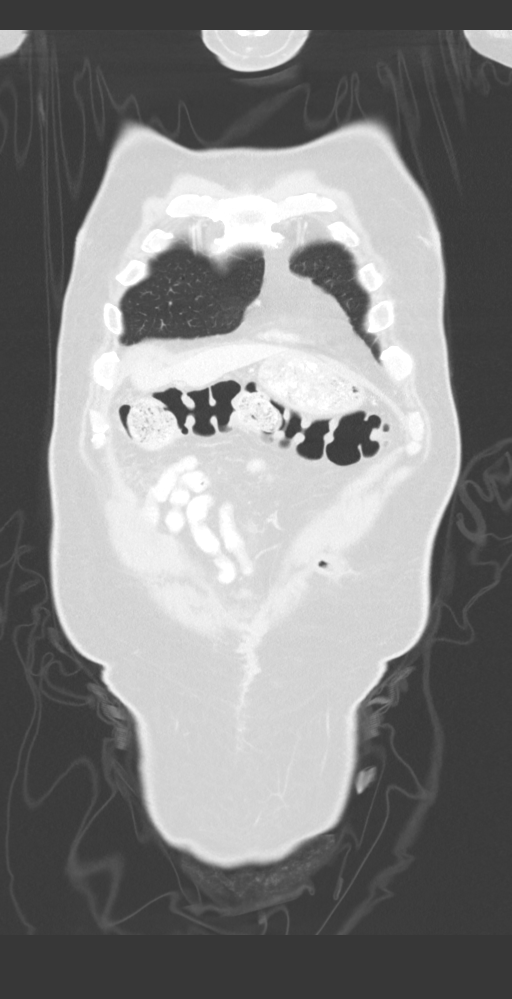
[im 54/135  lung]
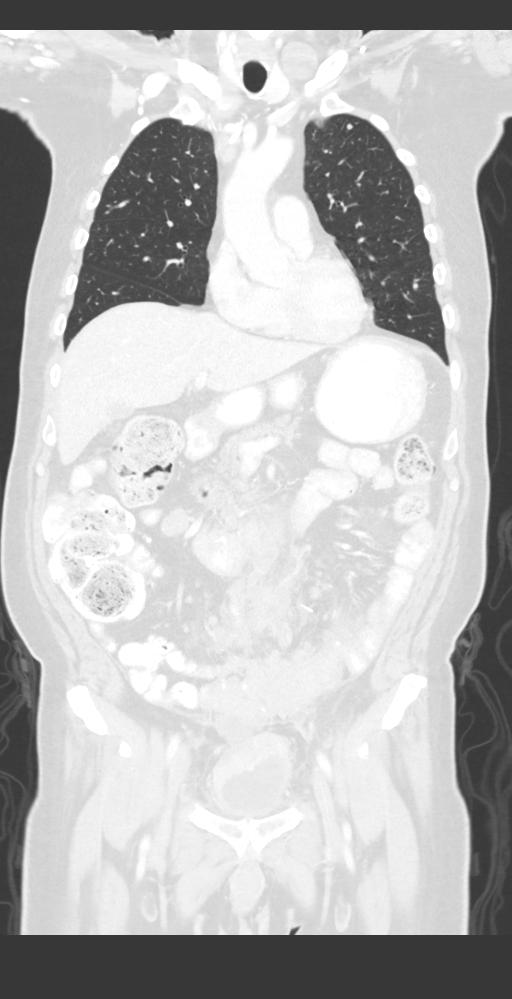
[im 81/135  lung]
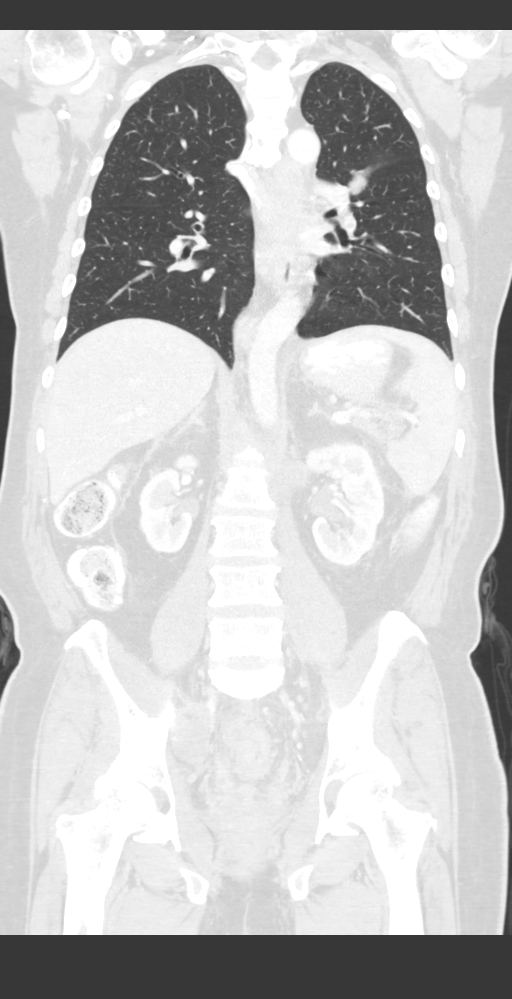

[13 of 36 positions shown; findings below may reference images not displayed]

FINDINGS: CT CHEST FINDINGS

Cardiovascular: Subtle pulmonary emboli within left lower lobe
segmental arteries noted. RV to LV ratio is 1.0. There is no
evidence of thoracic aortic aneurysm. No pericardial effusion
identified.

Mediastinum/Nodes: Multiple enlarged lymph nodes within the
mediastinum, right supraclavicular and left hilar region are new
since 10/20/2015. An index right peritracheal node measures 2.2 cm
(image 21). Thyroid gland is unremarkable. No tracheal abnormality
identified.

Lungs/Pleura: Irregular linear and and slightly nodular opacity
within both lungs noted, index areas measuring 1.8 cm in the left
lung apex (image 36) within the right upper lobe (image 42 and 45).
A 1.7 cm central of left lymph node/nodule is identified (image 59).
No pleural effusion or pneumothorax.

Musculoskeletal: No chest wall mass or suspicious bone lesions
identified.

CT ABDOMEN PELVIS FINDINGS

Hepatobiliary: A new subtle 1 cm hypodense right hepatic lesion
(image 52) is compatible with metastasis. The patient is status post
cholecystectomy with small amount of fluid in the cholecystectomy
bed. There is no evidence of biliary dilatation.

Pancreas: Unremarkable

Spleen: Unremarkable

Adrenals/Urinary Tract: Bilateral renal cortical atrophy noted. The
adrenal glands are unremarkable. Soft tissue thickening along the
inferior bladder has increased.

Stomach/Bowel: Colonic surgical changes noted. Increasing moderate
ill-defined soft tissue and nodules in the region of the anastomosis
noted. This is compatible with malignancy and has increased since
09/25/2016. Increased stranding within the anterior mesenteric
noted.

Vascular/Lymphatic: Increasing lymphadenopathy within the
retroperitoneum and mesenteric noted. An index left periaortic lymph
node mass now measures 4.5 x 3.7 cm (image 76), previously less
confluent and measuring 3.6 x 2.7 cm. There is no evidence of
abdominal aortic aneurysm.

Reproductive: Prostate enlargement again noted.

Other: No ascites, bowel obstruction or pneumoperitoneum. A left
lower quadrant ostomy is present.

Musculoskeletal: No acute or suspicious bony abnormalities are
identified.
IMPRESSION: Left lower lobe pulmonary emboli with CT evidence of right heart
strain (RV/LV Ratio = 1.0) consistent with at least submassive
(intermediate risk) PE. The presence of right heart strain has been
associated with an increased risk of morbidity and mortality.

Increasing malignancy and metastatic disease within the chest
abdomen and pelvis as described.

Multiple attempts to reach the on call provider have been
unsuccessful as of 7 p.m., but will continue.
